# Patient Record
Sex: Male | Born: 1950 | Race: White | Hispanic: No | Marital: Married | Smoking: Former smoker
Health system: Southern US, Community
[De-identification: ages and names within clinical notes are randomized; demographics above are authoritative.]

## PROBLEM LIST (undated history)

## (undated) DIAGNOSIS — H548 Legal blindness, as defined in USA: Secondary | ICD-10-CM

## (undated) DIAGNOSIS — H409 Unspecified glaucoma: Secondary | ICD-10-CM

## (undated) DIAGNOSIS — E119 Type 2 diabetes mellitus without complications: Secondary | ICD-10-CM

## (undated) HISTORY — PX: TIBIA FRACTURE SURGERY: SHX806

## (undated) HISTORY — PX: EYE SURGERY: SHX253

---

## 2007-08-15 ENCOUNTER — Emergency Department (HOSPITAL_COMMUNITY): Admission: EM | Admit: 2007-08-15 | Discharge: 2007-08-15 | Payer: Self-pay | Admitting: Emergency Medicine

## 2011-07-17 LAB — URINALYSIS, ROUTINE W REFLEX MICROSCOPIC
Bilirubin Urine: NEGATIVE
Glucose, UA: 500 — AB
Ketones, ur: NEGATIVE
Leukocytes, UA: NEGATIVE
Nitrite: NEGATIVE
Protein, ur: 30 — AB
Specific Gravity, Urine: 1.029
Urobilinogen, UA: 1
pH: 6.5

## 2011-07-17 LAB — URINE MICROSCOPIC-ADD ON

## 2017-01-16 DIAGNOSIS — H44521 Atrophy of globe, right eye: Secondary | ICD-10-CM | POA: Diagnosis not present

## 2017-01-16 DIAGNOSIS — H401133 Primary open-angle glaucoma, bilateral, severe stage: Secondary | ICD-10-CM | POA: Diagnosis not present

## 2017-01-16 DIAGNOSIS — H548 Legal blindness, as defined in USA: Secondary | ICD-10-CM | POA: Diagnosis not present

## 2017-02-27 ENCOUNTER — Emergency Department (HOSPITAL_COMMUNITY): Payer: BC Managed Care – PPO

## 2017-02-27 ENCOUNTER — Encounter (HOSPITAL_COMMUNITY): Payer: Self-pay

## 2017-02-27 ENCOUNTER — Observation Stay (HOSPITAL_COMMUNITY): Payer: BC Managed Care – PPO

## 2017-02-27 ENCOUNTER — Observation Stay (HOSPITAL_COMMUNITY)
Admission: EM | Admit: 2017-02-27 | Discharge: 2017-03-06 | DRG: 240 | Disposition: A | Discharge status: Skilled Nursing Facility | Payer: BC Managed Care – PPO | Attending: Internal Medicine | Admitting: Internal Medicine

## 2017-02-27 DIAGNOSIS — L97519 Non-pressure chronic ulcer of other part of right foot with unspecified severity: Secondary | ICD-10-CM | POA: Diagnosis not present

## 2017-02-27 DIAGNOSIS — N179 Acute kidney failure, unspecified: Secondary | ICD-10-CM | POA: Diagnosis not present

## 2017-02-27 DIAGNOSIS — H548 Legal blindness, as defined in USA: Secondary | ICD-10-CM | POA: Insufficient documentation

## 2017-02-27 DIAGNOSIS — I96 Gangrene, not elsewhere classified: Secondary | ICD-10-CM | POA: Diagnosis not present

## 2017-02-27 DIAGNOSIS — E1042 Type 1 diabetes mellitus with diabetic polyneuropathy: Secondary | ICD-10-CM

## 2017-02-27 DIAGNOSIS — E119 Type 2 diabetes mellitus without complications: Secondary | ICD-10-CM

## 2017-02-27 DIAGNOSIS — Z87891 Personal history of nicotine dependence: Secondary | ICD-10-CM | POA: Diagnosis not present

## 2017-02-27 DIAGNOSIS — E1052 Type 1 diabetes mellitus with diabetic peripheral angiopathy with gangrene: Secondary | ICD-10-CM | POA: Diagnosis not present

## 2017-02-27 DIAGNOSIS — E10621 Type 1 diabetes mellitus with foot ulcer: Secondary | ICD-10-CM | POA: Diagnosis not present

## 2017-02-27 DIAGNOSIS — L0390 Cellulitis, unspecified: Secondary | ICD-10-CM

## 2017-02-27 DIAGNOSIS — E871 Hypo-osmolality and hyponatremia: Secondary | ICD-10-CM | POA: Diagnosis not present

## 2017-02-27 DIAGNOSIS — J101 Influenza due to other identified influenza virus with other respiratory manifestations: Secondary | ICD-10-CM

## 2017-02-27 DIAGNOSIS — J09X2 Influenza due to identified novel influenza A virus with other respiratory manifestations: Secondary | ICD-10-CM | POA: Diagnosis not present

## 2017-02-27 DIAGNOSIS — I10 Essential (primary) hypertension: Secondary | ICD-10-CM | POA: Insufficient documentation

## 2017-02-27 DIAGNOSIS — E1069 Type 1 diabetes mellitus with other specified complication: Secondary | ICD-10-CM | POA: Diagnosis not present

## 2017-02-27 DIAGNOSIS — R739 Hyperglycemia, unspecified: Secondary | ICD-10-CM | POA: Diagnosis present

## 2017-02-27 DIAGNOSIS — S91301A Unspecified open wound, right foot, initial encounter: Secondary | ICD-10-CM

## 2017-02-27 DIAGNOSIS — E1040 Type 1 diabetes mellitus with diabetic neuropathy, unspecified: Secondary | ICD-10-CM | POA: Diagnosis not present

## 2017-02-27 DIAGNOSIS — H409 Unspecified glaucoma: Secondary | ICD-10-CM | POA: Diagnosis not present

## 2017-02-27 DIAGNOSIS — D72829 Elevated white blood cell count, unspecified: Secondary | ICD-10-CM | POA: Diagnosis present

## 2017-02-27 DIAGNOSIS — E1152 Type 2 diabetes mellitus with diabetic peripheral angiopathy with gangrene: Secondary | ICD-10-CM | POA: Diagnosis not present

## 2017-02-27 DIAGNOSIS — L97518 Non-pressure chronic ulcer of other part of right foot with other specified severity: Secondary | ICD-10-CM | POA: Diagnosis not present

## 2017-02-27 DIAGNOSIS — N19 Unspecified kidney failure: Secondary | ICD-10-CM

## 2017-02-27 DIAGNOSIS — M86171 Other acute osteomyelitis, right ankle and foot: Secondary | ICD-10-CM | POA: Diagnosis not present

## 2017-02-27 DIAGNOSIS — L03115 Cellulitis of right lower limb: Secondary | ICD-10-CM | POA: Diagnosis not present

## 2017-02-27 DIAGNOSIS — E876 Hypokalemia: Secondary | ICD-10-CM | POA: Insufficient documentation

## 2017-02-27 DIAGNOSIS — E1065 Type 1 diabetes mellitus with hyperglycemia: Secondary | ICD-10-CM | POA: Insufficient documentation

## 2017-02-27 DIAGNOSIS — R05 Cough: Secondary | ICD-10-CM | POA: Diagnosis not present

## 2017-02-27 DIAGNOSIS — M8610 Other acute osteomyelitis, unspecified site: Secondary | ICD-10-CM | POA: Diagnosis present

## 2017-02-27 HISTORY — DX: Unspecified glaucoma: H40.9

## 2017-02-27 HISTORY — DX: Legal blindness, as defined in USA: H54.8

## 2017-02-27 LAB — GLUCOSE, CAPILLARY: Glucose-Capillary: 286 mg/dL — ABNORMAL HIGH (ref 65–99)

## 2017-02-27 LAB — PROCALCITONIN: Procalcitonin: 4.43 ng/mL

## 2017-02-27 LAB — CBC WITH DIFFERENTIAL/PLATELET
Basophils Absolute: 0 10*3/uL (ref 0.0–0.1)
Basophils Relative: 0 %
Eosinophils Absolute: 0 10*3/uL (ref 0.0–0.7)
Eosinophils Relative: 0 %
HCT: 36.8 % — ABNORMAL LOW (ref 39.0–52.0)
Hemoglobin: 13.2 g/dL (ref 13.0–17.0)
Lymphocytes Relative: 2 %
Lymphs Abs: 0.6 10*3/uL — ABNORMAL LOW (ref 0.7–4.0)
MCH: 31.1 pg (ref 26.0–34.0)
MCHC: 35.9 g/dL (ref 30.0–36.0)
MCV: 86.6 fL (ref 78.0–100.0)
Monocytes Absolute: 2.6 10*3/uL — ABNORMAL HIGH (ref 0.1–1.0)
Monocytes Relative: 9 %
Neutro Abs: 26.2 10*3/uL — ABNORMAL HIGH (ref 1.7–7.7)
Neutrophils Relative %: 89 %
Platelets: 207 10*3/uL (ref 150–400)
RBC: 4.25 MIL/uL (ref 4.22–5.81)
RDW: 12.7 % (ref 11.5–15.5)
WBC: 29.4 10*3/uL — ABNORMAL HIGH (ref 4.0–10.5)

## 2017-02-27 LAB — COMPREHENSIVE METABOLIC PANEL
ALT: 13 U/L — ABNORMAL LOW (ref 17–63)
AST: 20 U/L (ref 15–41)
Albumin: 3 g/dL — ABNORMAL LOW (ref 3.5–5.0)
Alkaline Phosphatase: 155 U/L — ABNORMAL HIGH (ref 38–126)
Anion gap: 14 (ref 5–15)
BUN: 41 mg/dL — ABNORMAL HIGH (ref 6–20)
CO2: 23 mmol/L (ref 22–32)
Calcium: 8.5 mg/dL — ABNORMAL LOW (ref 8.9–10.3)
Chloride: 94 mmol/L — ABNORMAL LOW (ref 101–111)
Creatinine, Ser: 2.1 mg/dL — ABNORMAL HIGH (ref 0.61–1.24)
GFR calc Af Amer: 36 mL/min — ABNORMAL LOW (ref >60)
GFR calc non Af Amer: 31 mL/min — ABNORMAL LOW (ref >60)
Glucose, Bld: 327 mg/dL — ABNORMAL HIGH (ref 65–99)
Potassium: 3.4 mmol/L — ABNORMAL LOW (ref 3.5–5.1)
Sodium: 131 mmol/L — ABNORMAL LOW (ref 135–145)
Total Bilirubin: 1.2 mg/dL (ref 0.3–1.2)
Total Protein: 7.9 g/dL (ref 6.5–8.1)

## 2017-02-27 LAB — BLOOD GAS, VENOUS
Acid-base deficit: 1.2 mmol/L (ref 0.0–2.0)
Bicarbonate: 23.7 mmol/L (ref 20.0–28.0)
O2 Saturation: 35.8 %
Patient temperature: 98.6
pCO2, Ven: 42.3 mmHg — ABNORMAL LOW (ref 44.0–60.0)
pH, Ven: 7.367 (ref 7.250–7.430)

## 2017-02-27 LAB — CBG MONITORING, ED: Glucose-Capillary: 313 mg/dL — ABNORMAL HIGH (ref 65–99)

## 2017-02-27 MED ORDER — ACETAMINOPHEN 325 MG PO TABS: 650.0000 mg | ORAL_TABLET | Freq: Four times a day (QID) | ORAL | Status: DC | PRN

## 2017-02-27 MED ORDER — INSULIN ASPART 100 UNIT/ML ~~LOC~~ SOLN
0.0000 [IU] | Freq: Three times a day (TID) | SUBCUTANEOUS | Status: DC
  Administered 2017-02-28: 8 [IU] via SUBCUTANEOUS
  Administered 2017-02-28 (×2): 5 [IU] via SUBCUTANEOUS
  Administered 2017-03-01: 8 [IU] via SUBCUTANEOUS
  Administered 2017-03-01 – 2017-03-02 (×4): 3 [IU] via SUBCUTANEOUS
  Administered 2017-03-02 – 2017-03-04 (×3): 2 [IU] via SUBCUTANEOUS
  Administered 2017-03-05: 3 [IU] via SUBCUTANEOUS
  Administered 2017-03-06: 2 [IU] via SUBCUTANEOUS
  Administered 2017-03-06: 3 [IU] via SUBCUTANEOUS

## 2017-02-27 MED ORDER — ONDANSETRON HCL 4 MG PO TABS: 4.0000 mg | ORAL_TABLET | Freq: Four times a day (QID) | ORAL | Status: DC | PRN

## 2017-02-27 MED ORDER — ACETAMINOPHEN 650 MG RE SUPP: 650.0000 mg | Freq: Four times a day (QID) | RECTAL | Status: DC | PRN

## 2017-02-27 MED ORDER — SODIUM CHLORIDE 0.9 % IV SOLN
INTRAVENOUS | Status: DC
  Administered 2017-02-27 – 2017-02-28 (×2): via INTRAVENOUS

## 2017-02-27 MED ORDER — SODIUM CHLORIDE 0.9% FLUSH
3.0000 mL | Freq: Two times a day (BID) | INTRAVENOUS | Status: DC
  Administered 2017-02-27 – 2017-03-05 (×4): 3 mL via INTRAVENOUS

## 2017-02-27 MED ORDER — LATANOPROST 0.005 % OP SOLN
1.0000 [drp] | Freq: Every day | OPHTHALMIC | Status: DC
  Administered 2017-02-28 – 2017-03-05 (×5): 1 [drp] via OPHTHALMIC
  Filled 2017-02-27 (×3): qty 2.5

## 2017-02-27 MED ORDER — OSELTAMIVIR PHOSPHATE 30 MG PO CAPS
30.0000 mg | ORAL_CAPSULE | Freq: Two times a day (BID) | ORAL | Status: AC
  Administered 2017-02-28 – 2017-03-04 (×8): 30 mg via ORAL
  Filled 2017-02-27 (×11): qty 1

## 2017-02-27 MED ORDER — OSELTAMIVIR PHOSPHATE 75 MG PO CAPS
75.0000 mg | ORAL_CAPSULE | Freq: Once | ORAL | Status: AC
  Administered 2017-02-27: 75 mg via ORAL
  Filled 2017-02-27: qty 1

## 2017-02-27 MED ORDER — BRIMONIDINE TARTRATE 0.15 % OP SOLN
1.0000 [drp] | Freq: Three times a day (TID) | OPHTHALMIC | Status: DC
  Administered 2017-02-28 – 2017-03-04 (×10): 1 [drp] via OPHTHALMIC
  Filled 2017-02-27 (×3): qty 5

## 2017-02-27 MED ORDER — SODIUM CHLORIDE 0.9 % IV BOLUS (SEPSIS)
2000.0000 mL | Freq: Once | INTRAVENOUS | Status: AC
  Administered 2017-02-27: 2000 mL via INTRAVENOUS

## 2017-02-27 MED ORDER — INSULIN ASPART 100 UNIT/ML ~~LOC~~ SOLN
0.0000 [IU] | Freq: Every day | SUBCUTANEOUS | Status: DC
  Administered 2017-02-27 – 2017-02-28 (×2): 3 [IU] via SUBCUTANEOUS

## 2017-02-27 MED ORDER — PILOCARPINE HCL 2 % OP SOLN
1.0000 [drp] | Freq: Four times a day (QID) | OPHTHALMIC | Status: DC
  Administered 2017-02-28 – 2017-03-06 (×20): 1 [drp] via OPHTHALMIC
  Filled 2017-02-27 (×4): qty 15

## 2017-02-27 MED ORDER — BRINZOLAMIDE 1 % OP SUSP
1.0000 [drp] | Freq: Three times a day (TID) | OPHTHALMIC | Status: DC
  Administered 2017-02-28 – 2017-03-06 (×15): 1 [drp] via OPHTHALMIC
  Filled 2017-02-27 (×3): qty 10

## 2017-02-27 MED ORDER — HEPARIN SODIUM (PORCINE) 5000 UNIT/ML IJ SOLN
5000.0000 [IU] | Freq: Three times a day (TID) | INTRAMUSCULAR | Status: DC
  Administered 2017-02-27 – 2017-03-06 (×17): 5000 [IU] via SUBCUTANEOUS
  Filled 2017-02-27 (×17): qty 1

## 2017-02-27 MED ORDER — TIMOLOL MALEATE 0.5 % OP SOLN
1.0000 [drp] | Freq: Two times a day (BID) | OPHTHALMIC | Status: DC
Start: 2017-02-27 — End: 2017-03-06
  Administered 2017-02-28 – 2017-03-06 (×10): 1 [drp] via OPHTHALMIC
  Filled 2017-02-27 (×3): qty 5

## 2017-02-27 MED ORDER — ONDANSETRON HCL 4 MG/2ML IJ SOLN
4.0000 mg | Freq: Four times a day (QID) | INTRAMUSCULAR | Status: DC | PRN
  Administered 2017-02-27 – 2017-02-28 (×2): 4 mg via INTRAVENOUS
  Filled 2017-02-27 (×3): qty 2

## 2017-02-27 NOTE — ED Notes (Signed)
Provider at bedside

## 2017-02-27 NOTE — ED Triage Notes (Signed)
Pt comes via EMS from Urgent Care with complaints of flu like symptoms since Friday.  Pt ambulatory on arrival.  Denies N&V.  Endorses chills and low grade fever. CBG at UC 297. Vitals 140/98, HR 90 in route.

## 2017-02-27 NOTE — ED Notes (Signed)
Called 4th floor, Samantha, RN will be receiving care for patient. Andrena MewsSpoke Tara, RN to have the 20 minute timer start.

## 2017-02-27 NOTE — ED Notes (Signed)
PA from urgent called, states pt has WBC of 29 and had urinalyses showing blood, glucose and protein.

## 2017-02-27 NOTE — ED Triage Notes (Signed)
Pt went to urgent care for flu like symptoms.  Pt dx with flu A.  Pt had labs drawn and told his glucose was too high.  Sent here by EMS.

## 2017-02-27 NOTE — ED Provider Notes (Addendum)
[facility]-EMERGENCY DEPT Provider Note   CSN: [id number] Arrival date & time: 02/27/17  1444     History   Chief Complaint Chief Complaint  Patient presents with  . Flu Like Symptoms  . Hyperglycemia    HPI Samuel Roth is a 66 y.o. male.  HPI  Pt comes in with cc of flu like symptoms and elevated blood sugar. Pt has no known medical hx. Pt reports that he has been feeling sick since Friday. Pt has been having cough - producing white phlegm. No associated wheezing, chest pain, dib. + associated back aches, malaise and anorexia. + low grade fevers.  Pt went to urgent care, where he was diagnosed with flu and maybe new osnet diabetes, and so he was sent to the ER. Pt had a + flu test, WC of 29K and A1C > 9% per records from the outside hospital. Pt also had a neg CXR.  Past Medical History:  Diagnosis Date  . Glaucoma   . Legally blind     There are no active problems to display for this patient.   Past Surgical History:  Procedure Laterality Date  . EYE SURGERY    . TIBIA FRACTURE SURGERY         Home Medications    Prior to Admission medications   Medication Sig Start Date End Date Taking? Authorizing Provider  brimonidine (ALPHAGAN P) 0.1 % SOLN Place 1 drop into both eyes 3 (three) times daily. 12/15/16  Yes [provider]  brinzolamide (AZOPT) 1 % ophthalmic suspension Place 1 drop into the left eye 3 (three) times daily. 01/01/17  Yes [provider]  methazolamide (NEPTAZANE) 50 MG tablet Take 50 mg by mouth 3 (three) times daily. 02/02/17  Yes [provider]  pilocarpine (PILOCAR) 2 % ophthalmic solution Place 1 drop into the left eye 4 (four) times daily. 01/01/17  Yes [provider]  timolol (TIMOPTIC) 0.5 % ophthalmic solution Place 1 drop into the left eye 2 (two) times daily. 01/01/17  Yes [provider]  Travoprost, BAK Free, (TRAVATAN Z) 0.004 % SOLN ophthalmic solution Place 1 drop into the left eye daily.  12/15/16  Yes [provider]    Family History History reviewed. No pertinent family history.  Social History Social History  Substance Use Topics  . Smoking status: Former Games developer  . Smokeless tobacco: Former Neurosurgeon  . Alcohol use Yes     Comment: social     Allergies   Ciprofloxacin   Review of Systems Review of Systems  Constitutional: Positive for activity change, appetite change, diaphoresis and fever.  HENT: Negative for congestion.   Respiratory: Positive for cough. Negative for shortness of breath.   Cardiovascular: Negative for chest pain.  Gastrointestinal: Negative for abdominal pain, nausea and vomiting.  Genitourinary: Negative for dysuria.  Musculoskeletal: Positive for back pain and myalgias.     Physical Exam Updated Vital Signs BP 137/79 (BP Location: Right Arm)   Pulse 87   Temp 98.9 F (37.2 C) (Oral)   Resp 20   SpO2 99%   Physical Exam  Constitutional: He is oriented to person, place, and time. He appears well-developed.  HENT:  Head: Atraumatic.  Eyes: Pupils are equal, round, and reactive to light.  Neck: Neck supple. No JVD present.  No meningismus  Cardiovascular: Normal rate.   Pulmonary/Chest: Effort normal. He has no wheezes.  Abdominal: Soft. There is no tenderness.  Musculoskeletal: He exhibits no edema.  Lymphadenopathy:    He  has no cervical adenopathy.  Neurological: He is alert and oriented to person, place, and time.  Skin: Skin is warm.  Nursing note and vitals reviewed.    ED Treatments / Results  Labs (all labs ordered are listed, but only abnormal results are displayed) Labs Reviewed  COMPREHENSIVE METABOLIC PANEL - Abnormal; Notable for the following:       Result Value   Sodium 131 (*)    Potassium 3.4 (*)    Chloride 94 (*)    Glucose, Bld 327 (*)    BUN 41 (*)    Creatinine, Ser 2.10 (*)    Calcium 8.5 (*)    Albumin 3.0 (*)    ALT 13 (*)    Alkaline Phosphatase 155 (*)    GFR calc non Af Amer  31 (*)    GFR calc Af Amer 36 (*)    All other components within normal limits  CBC WITH DIFFERENTIAL/PLATELET - Abnormal; Notable for the following:    WBC 29.4 (*)    HCT 36.8 (*)    All other components within normal limits  BLOOD GAS, VENOUS - Abnormal; Notable for the following:    pCO2, Ven 42.3 (*)    All other components within normal limits  CBG MONITORING, ED - Abnormal; Notable for the following:    Glucose-Capillary 313 (*)    All other components within normal limits  CULTURE, BLOOD (ROUTINE X 2)  CULTURE, BLOOD (ROUTINE X 2)  I-STAT CG4 LACTIC ACID, ED    EKG  EKG Interpretation None       Radiology No results found.  Procedures Procedures (including critical care time)  Medications Ordered in ED Medications  sodium chloride 0.9 % bolus 2,000 mL (2,000 mLs Intravenous New Bag/Given 02/27/17 1649)  oseltamivir (TAMIFLU) capsule 75 mg (not administered)     Initial Impression / Assessment and Plan / ED Course  I have reviewed the triage vital signs and the nursing notes.  Pertinent labs & imaging results that were available during my care of the patient were reviewed by me and considered in my medical decision making (see chart for details).  Clinical Course as of Feb 28 1736  Wed Feb 27, 2017  1737 We will admit for AKI, with new onset DM and influenza. Patient has no PCP. Creatinine: (!) 2.10 [AN]    Clinical Course User Index [AN] Derwood KaplanNanavati, Kalmen Lollar, MD    Pt comes [facility] with generalized weakness. [facility] likely new onset DM and flu - so he was sent to the ER. Pt has respiratory symptoms, cough, myalgias, body aches. No flu exposure.   Final Clinical Impressions(s) / ED Diagnoses   Final diagnoses:  AKI (acute kidney injury) (HCC)  Diabetes mellitus, new onset (HCC)  Influenza A    New Prescriptions New Prescriptions   No medications on file     Derwood KaplanNanavati, Adriano Bischof, MD 02/27/17 1736    Derwood KaplanNanavati, Othel Dicostanzo, MD 02/27/17  1737

## 2017-02-27 NOTE — H&P (Signed)
History and Physical    Samuel SimsJohn Roth ZOX:[id number] DOB: 24-Jun-1951 DOA: 02/27/2017  PCP: Patient, No Pcp Per   Patient coming from: Home   I have personally briefly [facility]  Chief Complaint: Flu like symptoms   HPI: Samuel SimsJohn Roth is a 66 y.o. male with medical history significant of glaucoma who is legally blind presented with complaints of flulike symptoms for the last 5-6 days. Patient complains of cough with whitish expectoration associated with fever, chills, malaise, decreased appetite and decreased urine output. He also complains of nausea and intermittent vomiting. He denies any chest pain, palpitations, loss of consciousness, seizures, recent sick contact or travel.  Patient went to urgent care, where he was diagnosed with flu and hyperglycemia with question of new-onset diabetes and sent to emergency room for evaluation. Patient apparently had a negative chest x-ray in the urgent care facility.  ED Course: Patient was given IV fluids and oral Tamiflu. Hospitalist service was called to admit the patient  Review of Systems: As per HPI otherwise 10 point review of systems negative.    Past Medical History:  Diagnosis Date  . Glaucoma   . Legally blind     Past Surgical History:  Procedure Laterality Date  . EYE SURGERY    . TIBIA FRACTURE SURGERY     Social history  reports that he has quit smoking. He has quit using smokeless tobacco. He reports that he drinks alcohol. He reports that he does not use drugs.  Lives at home with family  Allergies  Allergen Reactions  . Ciprofloxacin Hives    History reviewed. No pertinent family history.  Prior to Admission medications   Medication Sig Start Date End Date Taking? Authorizing Provider  brimonidine (ALPHAGAN P) 0.1 % SOLN Place 1 drop into both eyes 3 (three) times daily. 12/15/16  Yes [provider]  brinzolamide (AZOPT) 1 % ophthalmic suspension Place 1 drop  into the left eye 3 (three) times daily. 01/01/17  Yes [provider]  methazolamide (NEPTAZANE) 50 MG tablet Take 50 mg by mouth 3 (three) times daily. 02/02/17  Yes [provider]  pilocarpine (PILOCAR) 2 % ophthalmic solution Place 1 drop into the left eye 4 (four) times daily. 01/01/17  Yes [provider]  timolol (TIMOPTIC) 0.5 % ophthalmic solution Place 1 drop into the left eye 2 (two) times daily. 01/01/17  Yes [provider]  Travoprost, BAK Free, (TRAVATAN Z) 0.004 % SOLN ophthalmic solution Place 1 drop into the left eye daily. 12/15/16  Yes [provider]    Physical Exam: Vitals:   02/27/17 1450 02/27/17 1649 02/27/17 1806  BP: (!) 143/78 137/79 130/71  Pulse: (!) 120 87 87  Resp: 18 20 20   Temp: 99.3 F (37.4 C) 98.9 F (37.2 C)   TempSrc: Oral Oral   SpO2: 100% 99% 100%    Constitutional: NAD, calm, comfortable Vitals:   02/27/17 1450 02/27/17 1649 02/27/17 1806  BP: (!) 143/78 137/79 130/71  Pulse: (!) 120 87 87  Resp: 18 20 20   Temp: 99.3 F (37.4 C) 98.9 F (37.2 C)   TempSrc: Oral Oral   SpO2: 100% 99% 100%   Eyes: PERRL, lids and conjunctivae normal ENMT: Mucous membranes are Dry. Posterior pharynx clear of any exudate or lesions.Normal dentition.  Neck: normal, supple, no masses, no thyromegaly Respiratory: clear to auscultation bilaterally, no wheezing, no crackles. Normal respiratory effort. No accessory muscle use.  Cardiovascular: S1 and S2  positive, intermittent tachycardia. No murmurs  Abdomen: no tenderness, no masses palpated. No hepatosplenomegaly. Bowel sounds positive.  Musculoskeletal: no clubbing / cyanosis. No joint deformity upper and lower extremities. Good ROM, no contractures. Normal muscle tone.  Skin: no rashes, lesions, ulcers. No induration Neurologic: CN 2-12 grossly intact. Sensation intact, DTR normal. Strength 5/5 in all 4.  Psychiatric: Normal judgment and insight. Alert and oriented  x 3. Normal mood.    Labs on Admission: I have personally reviewed following labs and imaging studies  CBC:  Recent Labs Lab 02/27/17 1644  WBC 29.4*  NEUTROABS 26.2*  HGB 13.2  HCT 36.8*  MCV 86.6  PLT 207   Basic Metabolic Panel:  Recent Labs Lab 02/27/17 1644  NA 131*  K 3.4*  CL 94*  CO2 23  GLUCOSE 327*  BUN 41*  CREATININE 2.10*  CALCIUM 8.5*   GFR: CrCl cannot be calculated (Unknown ideal weight.). Liver Function Tests:  Recent Labs Lab 02/27/17 1644  AST 20  ALT 13*  ALKPHOS 155*  BILITOT 1.2  PROT 7.9  ALBUMIN 3.0*   No results for input(s): LIPASE, AMYLASE in the last 168 hours. No results for input(s): AMMONIA in the last 168 hours. Coagulation Profile: No results for input(s): INR, PROTIME in the last 168 hours. Cardiac Enzymes: No results for input(s): CKTOTAL, CKMB, CKMBINDEX, TROPONINI in the last 168 hours. BNP (last 3 results) No results for input(s): PROBNP in the last 8760 hours. HbA1C: No results for input(s): HGBA1C in the last 72 hours. CBG:  Recent Labs Lab 02/27/17 1501  GLUCAP 313*   Lipid Profile: No results for input(s): CHOL, HDL, LDLCALC, TRIG, CHOLHDL, LDLDIRECT in the last 72 hours. Thyroid Function Tests: No results for input(s): TSH, T4TOTAL, FREET4, T3FREE, THYROIDAB in the last 72 hours. Anemia Panel: No results for input(s): VITAMINB12, FOLATE, FERRITIN, TIBC, IRON, RETICCTPCT in the last 72 hours. Urine analysis:    Component Value Date/Time   COLORURINE YELLOW 08/15/2007 0332   APPEARANCEUR CLOUDY (A) 08/15/2007 0332   LABSPEC 1.029 08/15/2007 0332   PHURINE 6.5 08/15/2007 0332   GLUCOSEU 500 (A) 08/15/2007 0332   HGBUR LARGE (A) 08/15/2007 0332   BILIRUBINUR NEGATIVE 08/15/2007 0332   KETONESUR NEGATIVE 08/15/2007 0332   PROTEINUR 30 (A) 08/15/2007 0332   UROBILINOGEN 1.0 08/15/2007 0332   NITRITE NEGATIVE 08/15/2007 0332   LEUKOCYTESUR NEGATIVE 08/15/2007 0332    Radiological Exams on  Admission: No results found.   Assessment/Plan Principal Problem:   Acute kidney injury (HCC) Active Problems:   Hyperglycemia   Leukocytosis  1. Dyspnea: Probably due to influenza bronchitis - Oxygen supplementation if needed  2. Probable influenza bronchitis:  - Supportive treatment. Tamiflu for 5 days. Repeat chest x-ray - If respiratory status does not improve, we will need to get CT scan of the chest  3. Acute kidney injury: - Probably due to dehydration. - IV fluids normal saline at 125 ML/hr. renal ultrasound. Repeat creatinine in a.m. Avoid nephrotoxic medications including oral glaucoma medication  4. Hyperglycemia: Rule out newly diagnosed diabetes - Check HbA1c in a.m.  - SSI for now. - May need outpatient follow-up with primary care provider  5. History of glaucoma and legally blind: Continue with home eyedrops. Hold oral medication  6. Hyponatremia and hypokalemia: Probably due to dehydration. IV fluids. Repeat a.m. Labs     DVT prophylaxis: Heparin Code Status: Full  Family Communication: Discussed with wife present at bedside Disposition Plan: Home in 1-2 days Consults called: None  Admission status: Observation   Glade Lloyd MD [facility] Pager [phone]  If 7PM-7AM, please contact night-coverage [email or web address] Password [facility]  02/27/2017, 6:09 PM

## 2017-02-27 NOTE — ED Notes (Signed)
Hospitalist at bedside 

## 2017-02-28 ENCOUNTER — Other Ambulatory Visit (INDEPENDENT_AMBULATORY_CARE_PROVIDER_SITE_OTHER): Payer: Self-pay | Admitting: Family

## 2017-02-28 ENCOUNTER — Observation Stay (HOSPITAL_COMMUNITY): Payer: BC Managed Care – PPO

## 2017-02-28 DIAGNOSIS — N19 Unspecified kidney failure: Secondary | ICD-10-CM | POA: Diagnosis not present

## 2017-02-28 DIAGNOSIS — E871 Hypo-osmolality and hyponatremia: Secondary | ICD-10-CM | POA: Diagnosis not present

## 2017-02-28 DIAGNOSIS — Z87891 Personal history of nicotine dependence: Secondary | ICD-10-CM | POA: Diagnosis not present

## 2017-02-28 DIAGNOSIS — M8610 Other acute osteomyelitis, unspecified site: Secondary | ICD-10-CM | POA: Diagnosis not present

## 2017-02-28 DIAGNOSIS — Z79899 Other long term (current) drug therapy: Secondary | ICD-10-CM | POA: Diagnosis not present

## 2017-02-28 DIAGNOSIS — S91301A Unspecified open wound, right foot, initial encounter: Secondary | ICD-10-CM | POA: Diagnosis not present

## 2017-02-28 DIAGNOSIS — M86161 Other acute osteomyelitis, right tibia and fibula: Secondary | ICD-10-CM | POA: Diagnosis not present

## 2017-02-28 DIAGNOSIS — H409 Unspecified glaucoma: Secondary | ICD-10-CM | POA: Diagnosis not present

## 2017-02-28 DIAGNOSIS — H548 Legal blindness, as defined in USA: Secondary | ICD-10-CM | POA: Diagnosis present

## 2017-02-28 DIAGNOSIS — L97518 Non-pressure chronic ulcer of other part of right foot with other specified severity: Secondary | ICD-10-CM | POA: Diagnosis not present

## 2017-02-28 DIAGNOSIS — D72829 Elevated white blood cell count, unspecified: Secondary | ICD-10-CM | POA: Diagnosis not present

## 2017-02-28 DIAGNOSIS — G8918 Other acute postprocedural pain: Secondary | ICD-10-CM | POA: Diagnosis not present

## 2017-02-28 DIAGNOSIS — M869 Osteomyelitis, unspecified: Secondary | ICD-10-CM | POA: Diagnosis not present

## 2017-02-28 DIAGNOSIS — I96 Gangrene, not elsewhere classified: Secondary | ICD-10-CM | POA: Diagnosis not present

## 2017-02-28 DIAGNOSIS — Z881 Allergy status to other antibiotic agents status: Secondary | ICD-10-CM | POA: Diagnosis not present

## 2017-02-28 DIAGNOSIS — N289 Disorder of kidney and ureter, unspecified: Secondary | ICD-10-CM | POA: Diagnosis not present

## 2017-02-28 DIAGNOSIS — E876 Hypokalemia: Secondary | ICD-10-CM | POA: Diagnosis present

## 2017-02-28 DIAGNOSIS — I10 Essential (primary) hypertension: Secondary | ICD-10-CM | POA: Diagnosis present

## 2017-02-28 DIAGNOSIS — L03115 Cellulitis of right lower limb: Secondary | ICD-10-CM | POA: Diagnosis not present

## 2017-02-28 DIAGNOSIS — E1140 Type 2 diabetes mellitus with diabetic neuropathy, unspecified: Secondary | ICD-10-CM | POA: Diagnosis present

## 2017-02-28 DIAGNOSIS — K219 Gastro-esophageal reflux disease without esophagitis: Secondary | ICD-10-CM | POA: Diagnosis present

## 2017-02-28 DIAGNOSIS — M86171 Other acute osteomyelitis, right ankle and foot: Secondary | ICD-10-CM | POA: Diagnosis not present

## 2017-02-28 DIAGNOSIS — J40 Bronchitis, not specified as acute or chronic: Secondary | ICD-10-CM | POA: Diagnosis present

## 2017-02-28 DIAGNOSIS — E1042 Type 1 diabetes mellitus with diabetic polyneuropathy: Secondary | ICD-10-CM | POA: Diagnosis not present

## 2017-02-28 DIAGNOSIS — Z4889 Encounter for other specified surgical aftercare: Secondary | ICD-10-CM | POA: Diagnosis not present

## 2017-02-28 DIAGNOSIS — E1152 Type 2 diabetes mellitus with diabetic peripheral angiopathy with gangrene: Secondary | ICD-10-CM | POA: Diagnosis not present

## 2017-02-28 DIAGNOSIS — N170 Acute kidney failure with tubular necrosis: Secondary | ICD-10-CM | POA: Diagnosis not present

## 2017-02-28 DIAGNOSIS — S88111D Complete traumatic amputation at level between knee and ankle, right lower leg, subsequent encounter: Secondary | ICD-10-CM | POA: Diagnosis not present

## 2017-02-28 DIAGNOSIS — Z89511 Acquired absence of right leg below knee: Secondary | ICD-10-CM | POA: Diagnosis not present

## 2017-02-28 DIAGNOSIS — J449 Chronic obstructive pulmonary disease, unspecified: Secondary | ICD-10-CM | POA: Diagnosis present

## 2017-02-28 DIAGNOSIS — E11621 Type 2 diabetes mellitus with foot ulcer: Secondary | ICD-10-CM | POA: Diagnosis present

## 2017-02-28 DIAGNOSIS — E1169 Type 2 diabetes mellitus with other specified complication: Secondary | ICD-10-CM | POA: Diagnosis not present

## 2017-02-28 DIAGNOSIS — E1142 Type 2 diabetes mellitus with diabetic polyneuropathy: Secondary | ICD-10-CM | POA: Diagnosis not present

## 2017-02-28 DIAGNOSIS — E119 Type 2 diabetes mellitus without complications: Secondary | ICD-10-CM | POA: Diagnosis not present

## 2017-02-28 DIAGNOSIS — J101 Influenza due to other identified influenza virus with other respiratory manifestations: Secondary | ICD-10-CM | POA: Diagnosis not present

## 2017-02-28 DIAGNOSIS — R278 Other lack of coordination: Secondary | ICD-10-CM | POA: Diagnosis not present

## 2017-02-28 DIAGNOSIS — M6281 Muscle weakness (generalized): Secondary | ICD-10-CM | POA: Diagnosis not present

## 2017-02-28 DIAGNOSIS — R488 Other symbolic dysfunctions: Secondary | ICD-10-CM | POA: Diagnosis not present

## 2017-02-28 DIAGNOSIS — E1165 Type 2 diabetes mellitus with hyperglycemia: Secondary | ICD-10-CM | POA: Diagnosis not present

## 2017-02-28 DIAGNOSIS — L0390 Cellulitis, unspecified: Secondary | ICD-10-CM | POA: Diagnosis not present

## 2017-02-28 DIAGNOSIS — N179 Acute kidney failure, unspecified: Secondary | ICD-10-CM | POA: Diagnosis not present

## 2017-02-28 DIAGNOSIS — E860 Dehydration: Secondary | ICD-10-CM | POA: Diagnosis present

## 2017-02-28 DIAGNOSIS — R739 Hyperglycemia, unspecified: Secondary | ICD-10-CM | POA: Diagnosis not present

## 2017-02-28 DIAGNOSIS — S88019A Complete traumatic amputation at knee level, unspecified lower leg, initial encounter: Secondary | ICD-10-CM | POA: Diagnosis not present

## 2017-02-28 LAB — GLUCOSE, CAPILLARY
Glucose-Capillary: 241 mg/dL — ABNORMAL HIGH (ref 65–99)
Glucose-Capillary: 225 mg/dL — ABNORMAL HIGH (ref 65–99)
Glucose-Capillary: 256 mg/dL — ABNORMAL HIGH (ref 65–99)
Glucose-Capillary: 257 mg/dL — ABNORMAL HIGH (ref 65–99)

## 2017-02-28 LAB — SURGICAL PCR SCREEN
MRSA, PCR: NEGATIVE
Staphylococcus aureus: NEGATIVE

## 2017-02-28 LAB — COMPREHENSIVE METABOLIC PANEL
ALT: 10 U/L — ABNORMAL LOW (ref 17–63)
AST: 15 U/L (ref 15–41)
Albumin: 2.1 g/dL — ABNORMAL LOW (ref 3.5–5.0)
Alkaline Phosphatase: 124 U/L (ref 38–126)
Anion gap: 13 (ref 5–15)
BUN: 26 mg/dL — ABNORMAL HIGH (ref 6–20)
CO2: 21 mmol/L — ABNORMAL LOW (ref 22–32)
Calcium: 7.6 mg/dL — ABNORMAL LOW (ref 8.9–10.3)
Chloride: 98 mmol/L — ABNORMAL LOW (ref 101–111)
Creatinine, Ser: 1.24 mg/dL (ref 0.61–1.24)
GFR calc Af Amer: >60 mL/min (ref >60)
GFR calc non Af Amer: 59 mL/min — ABNORMAL LOW (ref >60)
Glucose, Bld: 266 mg/dL — ABNORMAL HIGH (ref 65–99)
Potassium: 2.9 mmol/L — ABNORMAL LOW (ref 3.5–5.1)
Sodium: 132 mmol/L — ABNORMAL LOW (ref 135–145)
Total Bilirubin: 1.2 mg/dL (ref 0.3–1.2)
Total Protein: 6 g/dL — ABNORMAL LOW (ref 6.5–8.1)

## 2017-02-28 LAB — CBC WITH DIFFERENTIAL/PLATELET
Basophils Absolute: 0 10*3/uL (ref 0.0–0.1)
Basophils Relative: 0 %
Eosinophils Absolute: 0 10*3/uL (ref 0.0–0.7)
Eosinophils Relative: 0 %
HCT: 28.9 % — ABNORMAL LOW (ref 39.0–52.0)
Hemoglobin: 10.3 g/dL — ABNORMAL LOW (ref 13.0–17.0)
Lymphocytes Relative: 3 %
Lymphs Abs: 0.8 10*3/uL (ref 0.7–4.0)
MCH: 30.7 pg (ref 26.0–34.0)
MCHC: 35.6 g/dL (ref 30.0–36.0)
MCV: 86.3 fL (ref 78.0–100.0)
Monocytes Absolute: 2.4 10*3/uL — ABNORMAL HIGH (ref 0.1–1.0)
Monocytes Relative: 9 %
Neutro Abs: 23.9 10*3/uL — ABNORMAL HIGH (ref 1.7–7.7)
Neutrophils Relative %: 88 %
Platelets: 218 10*3/uL (ref 150–400)
RBC: 3.35 MIL/uL — ABNORMAL LOW (ref 4.22–5.81)
RDW: 12.9 % (ref 11.5–15.5)
WBC: 27.1 10*3/uL — ABNORMAL HIGH (ref 4.0–10.5)

## 2017-02-28 LAB — LACTIC ACID, PLASMA
Lactic Acid, Venous: 1.1 mmol/L (ref 0.5–1.9)
Lactic Acid, Venous: 2.4 mmol/L (ref 0.5–1.9)

## 2017-02-28 LAB — HIV ANTIBODY (ROUTINE TESTING W REFLEX): HIV Screen 4th Generation wRfx: NONREACTIVE

## 2017-02-28 MED ORDER — PIPERACILLIN-TAZOBACTAM 3.375 G IVPB
3.3750 g | Freq: Three times a day (TID) | INTRAVENOUS | Status: DC
  Administered 2017-02-28 – 2017-03-06 (×16): 3.375 g via INTRAVENOUS
  Filled 2017-02-28 (×20): qty 50

## 2017-02-28 MED ORDER — PROMETHAZINE HCL 25 MG/ML IJ SOLN
12.5000 mg | Freq: Four times a day (QID) | INTRAMUSCULAR | Status: DC | PRN
  Administered 2017-02-28: 12.5 mg via INTRAVENOUS

## 2017-02-28 MED ORDER — CHLORHEXIDINE GLUCONATE 4 % EX LIQD
60.0000 mL | Freq: Once | CUTANEOUS | Status: AC
  Administered 2017-03-01: 4 via TOPICAL

## 2017-02-28 MED ORDER — SODIUM CHLORIDE 0.9% FLUSH
10.0000 mL | INTRAVENOUS | Status: DC | PRN
  Administered 2017-03-01 (×2): 10 mL
  Filled 2017-02-28 (×2): qty 40

## 2017-02-28 MED ORDER — VANCOMYCIN HCL IN DEXTROSE 1-5 GM/200ML-% IV SOLN: 1000.0000 mg | INTRAVENOUS | Status: DC

## 2017-02-28 MED ORDER — LIVING WELL WITH DIABETES BOOK
Freq: Once | Status: AC
  Administered 2017-03-01: 10:00:00
  Filled 2017-02-28: qty 1

## 2017-02-28 MED ORDER — POTASSIUM CHLORIDE IN NACL 40-0.9 MEQ/L-% IV SOLN
INTRAVENOUS | Status: DC
  Administered 2017-02-28: 100 mL/h via INTRAVENOUS
  Filled 2017-02-28 (×2): qty 1000

## 2017-02-28 MED ORDER — ONDANSETRON HCL 4 MG/2ML IJ SOLN
4.0000 mg | Freq: Four times a day (QID) | INTRAMUSCULAR | Status: DC | PRN
  Administered 2017-02-28: 4 mg via INTRAMUSCULAR

## 2017-02-28 MED ORDER — VANCOMYCIN HCL IN DEXTROSE 1-5 GM/200ML-% IV SOLN
1000.0000 mg | Freq: Once | INTRAVENOUS | Status: AC
  Administered 2017-02-28: 1000 mg via INTRAVENOUS
  Filled 2017-02-28: qty 200

## 2017-02-28 MED ORDER — INSULIN GLARGINE 100 UNIT/ML ~~LOC~~ SOLN
15.0000 [IU] | Freq: Every day | SUBCUTANEOUS | Status: DC
  Administered 2017-02-28 – 2017-03-05 (×6): 15 [IU] via SUBCUTANEOUS
  Filled 2017-02-28 (×7): qty 0.15

## 2017-02-28 MED ORDER — CEFAZOLIN SODIUM-DEXTROSE 2-4 GM/100ML-% IV SOLN
2.0000 g | INTRAVENOUS | Status: AC
  Administered 2017-03-01: 2 g via INTRAVENOUS
  Filled 2017-02-28: qty 100

## 2017-02-28 MED ORDER — VANCOMYCIN HCL IN DEXTROSE 1-5 GM/200ML-% IV SOLN
1000.0000 mg | Freq: Two times a day (BID) | INTRAVENOUS | Status: DC
  Administered 2017-02-28 – 2017-03-06 (×10): 1000 mg via INTRAVENOUS
  Filled 2017-02-28 (×14): qty 200

## 2017-02-28 MED ORDER — PROMETHAZINE HCL 25 MG/ML IJ SOLN
12.5000 mg | Freq: Once | INTRAMUSCULAR | Status: DC
  Filled 2017-02-28: qty 1

## 2017-02-28 NOTE — Progress Notes (Signed)
CRITICAL VALUE ALERT  Critical value received: xray report shows soft tissue gas/ swelling.  Suspicious for gas forming/ necrotizing infection  Date of notification: 02/28/17  Time of notification: 0850  Critical value read back: yes  Nurse who received alert:  Corrie DandyMary Karren Newland  MD notified (1st page): Dr. Hanley BenAlekh on floor, verbal given.  Time of first page:    MD notified (2nd page):   Time of second page:  Responding MD:   Time MD responded:

## 2017-02-28 NOTE — Progress Notes (Signed)
Initial Nutrition Assessment  DOCUMENTATION CODES:   Not applicable  INTERVENTION:  - Will provide appropriate interventions at follow-up. - Will monitor for need for diet education prior to d/c.  NUTRITION DIAGNOSIS:   Inadequate protein intake related to  (current diet order) as evidenced by  (CLD does not meet estimated protein need).  GOAL:   Patient will meet greater than or equal to 90% of their needs  MONITOR:   Diet advancement, PO intake, Weight trends, Labs, Skin, I & O's  REASON FOR ASSESSMENT:   Malnutrition Screening Tool  ASSESSMENT:   66 y.o. male with medical history significant of glaucoma who is legally blind presented with complaints of flulike symptoms for the last 5-6 days. Patient complains of cough with whitish expectoration associated with fever, chills, malaise, decreased appetite and decreased urine output. He also complains of nausea and intermittent vomiting.  Pt seen for MST. BMI indicates normal weight/borderline overweight. Pt on CLD with no intakes and plan for NPO at midnight tonight for probable amputation tomorrow; [facility] He confirms flu-like symptoms for 1 week and that his last episode of emesis was a few minutes prior to RD visit. He feels N/V have worsened since admission and attributes this to medications. He was unable to consume anything from CLD today and is uninterested in consuming anything at this time. He states that in the days PTA he was forcing himself to consume items such as applesauce and yogurt but was only able to take a few bites before vomiting; unable to tolerate any other foods during that time. Unable to obtain further information at this time d/t pt feeling so unwell. RN reports pt is likely septic.   Pt reports UBW of 169 lbs with his clothes on and feels that without clothes his weight may be anywhere from 159-165 lbs; will use weight in chart to estimate needs and monitor weight trends  closely. Pt would prefer physical assessment not be done at this time. Will attempt at follow-up.  Medications reviewed; sliding scale Novolog.  Labs reviewed; CBG: 241 mg/dL, Na: 409132 mmol/L, Cl: 98 mmol/L, K: 2.9 mmol/L, BUN: 26 mg/dL, Ca: 7.6 mg/dL.  IVF: NS-40 mEq KCl @ 100 mL/hr.    Diet Order:  Diet NPO time specified Diet clear liquid Room service appropriate? Yes; Fluid consistency: Thin  Skin:  Wound (see comment) (R foot DM ulcer)  Last BM:  PTA/unknown  Height:   Ht Readings from Last 1 Encounters:  02/27/17 5\' 9"  (1.753 m)    Weight:   Wt Readings from Last 1 Encounters:  02/27/17 169 lb 1.5 oz (76.7 kg)    Ideal Body Weight:  72.73 kg  BMI:  Body mass index is 24.97 kg/m.  Estimated Nutritional Needs:   Kcal:  1920-2150 (25-28 kcal/kg)  Protein:  107-122 grams (1.4-1.6 gram/kg)  Fluid:  >/= 2 L/day  EDUCATION NEEDS:   Education needs no appropriate at this time    Trenton GammonJessica Kijana Cromie, MS, RD, LDN, CNSC Inpatient Clinical Dietitian Pager # [phone] After hours/weekend pager # [phone]

## 2017-02-28 NOTE — Progress Notes (Signed)
Pharmacy Antibiotic Note  Samuel SimsJohn Roth is a 66 y.o. male presented to the ED from Urgent Care  on 02/27/2017 for further workup flu like symptoms and hyperglycemia.  He was also found to have right open foot wound and necrotic fourth and fifth toes.  Vancomycin started on admission for cellulitis/ r/o osteomyelitis.  To add zosyn to abx regimen.  [facility].  Today, 02/28/2017: -  Tmax 100.1, wbc 27.1 -  scr down 1.24 (crcl~59)-- improved with hydration   Plan: - change vancomycin to 1 gm q12h - zosyn 3.375 gm IV q8h (infuse over 4 hours) - daily scr - f/u foot x-ray  ____________________________  Height: 5\' 9"  (175.3 cm) Weight: 169 lb 1.5 oz (76.7 kg) IBW/kg (Calculated) : 70.7  Temp (24hrs), Avg:99.2 F (37.3 C), Min:98.6 F (37 C), Max:100.1 F (37.8 C)   Recent Labs Lab 02/27/17 1644 02/28/17 0436  WBC 29.4* 27.1*  CREATININE 2.10* 1.24  LATICACIDVEN  --  1.1    Estimated Creatinine Clearance: 59.4 mL/min (by C-G formula based on SCr of 1.24 mg/dL).    Allergies  Allergen Reactions  . Ciprofloxacin Hives    Thank you for allowing pharmacy to be a part of this patient's care.  Lucia Gaskinsham, Juley Giovanetti P 02/28/2017 8:35 AM

## 2017-02-28 NOTE — Progress Notes (Signed)
CRITICAL VALUE ALERT  Critical value received: lactic acid 2.4  Date of notification: 02/28/17  Time of notification: 0915  Critical value read back: yes  Nurse who received alert:  Corrie DandyMary Christropher Gintz  MD notified (1st page): Dr Hanley BenAlekh on floor, verbal given  Time of first page:    MD notified (2nd page):   Time of second page:  Responding MD:   Time MD responded:

## 2017-02-28 NOTE — Consult Note (Signed)
Reason for Consult:Foot wound/necrosis Referring Physician: Tarique Roth is an 66 y.o. male.  HPI: Samuel Roth was admitted to the hospital yesterday for a respiratory infection, possibly flu. During his admission he was noticed to have a cellulitic, swollen, and partially necrotic right foot. He has noticed that it's been swollen for about a day but denies pain and has been able to ambulate. He is blind.  Past Medical History:  Diagnosis Date  . Glaucoma   . Legally blind     Past Surgical History:  Procedure Laterality Date  . EYE SURGERY    . TIBIA FRACTURE SURGERY      History reviewed. No pertinent family history.  Social History:  reports that he has quit smoking. He has quit using smokeless tobacco. He reports that he drinks alcohol. He reports that he does not use drugs.  Allergies:  Allergies  Allergen Reactions  . Ciprofloxacin Hives    Medications: I have reviewed the patient's current medications.  Results for orders placed or performed during the hospital encounter of 02/27/17 (from the past 48 hour(s))  CBG monitoring, ED     Status: Abnormal   Collection Time: 02/27/17  3:01 PM  Result Value Ref Range   Glucose-Capillary 313 (H) 65 - 99 mg/dL  Comprehensive metabolic panel     Status: Abnormal   Collection Time: 02/27/17  4:44 PM  Result Value Ref Range   Sodium 131 (L) 135 - 145 mmol/L   Potassium 3.4 (L) 3.5 - 5.1 mmol/L   Chloride 94 (L) 101 - 111 mmol/L   CO2 23 22 - 32 mmol/L   Glucose, Bld 327 (H) 65 - 99 mg/dL   BUN 41 (H) 6 - 20 mg/dL   Creatinine, Ser 2.10 (H) 0.61 - 1.24 mg/dL   Calcium 8.5 (L) 8.9 - 10.3 mg/dL   Total Protein 7.9 6.5 - 8.1 g/dL   Albumin 3.0 (L) 3.5 - 5.0 g/dL   AST 20 15 - 41 U/L   ALT 13 (L) 17 - 63 U/L   Alkaline Phosphatase 155 (H) 38 - 126 U/L   Total Bilirubin 1.2 0.3 - 1.2 mg/dL   GFR calc non Af Amer 31 (L) >60 mL/min   GFR calc Af Amer 36 (L) >60 mL/min    Comment: (NOTE) The eGFR has been calculated using the  CKD EPI equation. This calculation has not been validated in all clinical situations. eGFR's persistently <60 mL/min signify possible Chronic Kidney Disease.    Anion gap 14 5 - 15  CBC with Differential     Status: Abnormal   Collection Time: 02/27/17  4:44 PM  Result Value Ref Range   WBC 29.4 (H) 4.0 - 10.5 K/uL   RBC 4.25 4.22 - 5.81 MIL/uL   Hemoglobin 13.2 13.0 - 17.0 g/dL   HCT 36.8 (L) 39.0 - 52.0 %   MCV 86.6 78.0 - 100.0 fL   MCH 31.1 26.0 - 34.0 pg   MCHC 35.9 30.0 - 36.0 g/dL   RDW 12.7 11.5 - 15.5 %   Platelets 207 150 - 400 K/uL   Neutrophils Relative % 89 %   Lymphocytes Relative 2 %   Monocytes Relative 9 %   Eosinophils Relative 0 %   Basophils Relative 0 %   Neutro Abs 26.2 (H) 1.7 - 7.7 K/uL   Lymphs Abs 0.6 (L) 0.7 - 4.0 K/uL   Monocytes Absolute 2.6 (H) 0.1 - 1.0 K/uL   Eosinophils Absolute 0.0 0.0 -  0.7 K/uL   Basophils Absolute 0.0 0.0 - 0.1 K/uL   WBC Morphology VACUOLATED NEUTROPHILS     Comment: DOHLE BODIES  Procalcitonin - Baseline     Status: None   Collection Time: 02/27/17  4:44 PM  Result Value Ref Range   Procalcitonin 4.43 ng/mL    Comment:        Interpretation: PCT > 2 ng/mL: Systemic infection (sepsis) is likely, unless other causes are known. (NOTE)         ICU PCT Algorithm               Non ICU PCT Algorithm    ----------------------------     ------------------------------         PCT < 0.25 ng/mL                 PCT < 0.1 ng/mL     Stopping of antibiotics            Stopping of antibiotics       strongly encouraged.               strongly encouraged.    ----------------------------     ------------------------------       PCT level decrease by               PCT < 0.25 ng/mL       >= 80% from peak PCT       OR PCT 0.25 - 0.5 ng/mL          Stopping of antibiotics                                             encouraged.     Stopping of antibiotics           encouraged.    ----------------------------      ------------------------------       PCT level decrease by              PCT >= 0.25 ng/mL       < 80% from peak PCT        AND PCT >= 0.5 ng/mL            Continuing antibiotics                                               encouraged.       Continuing antibiotics            encouraged.    ----------------------------     ------------------------------     PCT level increase compared          PCT > 0.5 ng/mL         with peak PCT AND          PCT >= 0.5 ng/mL             Escalation of antibiotics                                          strongly encouraged.      Escalation of antibiotics        strongly encouraged.  Blood gas, venous     Status: Abnormal   Collection Time: 02/27/17  4:46 PM  Result Value Ref Range   pH, Ven 7.367 7.250 - 7.430   pCO2, Ven 42.3 (L) 44.0 - 60.0 mmHg   pO2, Ven BELOW REPORTABLE RANGE 32.0 - 45.0 mmHg    Comment: RBV DR NANAVATI BY ANGIE DUNLAP RRT RCP ON 02/27/17 AT 1650   Bicarbonate 23.7 20.0 - 28.0 mmol/L   Acid-base deficit 1.2 0.0 - 2.0 mmol/L   O2 Saturation 35.8 %   Patient temperature 98.6    Collection site VEIN    Drawn by DRAWN BY RN    Sample type VENOUS   Glucose, capillary     Status: Abnormal   Collection Time: 02/27/17 10:31 PM  Result Value Ref Range   Glucose-Capillary 286 (H) 65 - 99 mg/dL  Comprehensive metabolic panel     Status: Abnormal   Collection Time: 02/28/17  4:36 AM  Result Value Ref Range   Sodium 132 (L) 135 - 145 mmol/L   Potassium 2.9 (L) 3.5 - 5.1 mmol/L   Chloride 98 (L) 101 - 111 mmol/L   CO2 21 (L) 22 - 32 mmol/L   Glucose, Bld 266 (H) 65 - 99 mg/dL   BUN 26 (H) 6 - 20 mg/dL   Creatinine, Ser 1.24 0.61 - 1.24 mg/dL   Calcium 7.6 (L) 8.9 - 10.3 mg/dL   Total Protein 6.0 (L) 6.5 - 8.1 g/dL   Albumin 2.1 (L) 3.5 - 5.0 g/dL   AST 15 15 - 41 U/L   ALT 10 (L) 17 - 63 U/L   Alkaline Phosphatase 124 38 - 126 U/L   Total Bilirubin 1.2 0.3 - 1.2 mg/dL   GFR calc non Af Amer 59 (L) >60 mL/min   GFR calc Af  Amer >60 >60 mL/min    Comment: (NOTE) The eGFR has been calculated using the CKD EPI equation. This calculation has not been validated in all clinical situations. eGFR's persistently <60 mL/min signify possible Chronic Kidney Disease.    Anion gap 13 5 - 15  CBC with Differential/Platelet     Status: Abnormal   Collection Time: 02/28/17  4:36 AM  Result Value Ref Range   WBC 27.1 (H) 4.0 - 10.5 K/uL   RBC 3.35 (L) 4.22 - 5.81 MIL/uL   Hemoglobin 10.3 (L) 13.0 - 17.0 g/dL    Comment: DELTA CHECK NOTED REPEATED TO VERIFY    HCT 28.9 (L) 39.0 - 52.0 %   MCV 86.3 78.0 - 100.0 fL   MCH 30.7 26.0 - 34.0 pg   MCHC 35.6 30.0 - 36.0 g/dL   RDW 12.9 11.5 - 15.5 %   Platelets 218 150 - 400 K/uL   Neutrophils Relative % 88 %   Lymphocytes Relative 3 %   Monocytes Relative 9 %   Eosinophils Relative 0 %   Basophils Relative 0 %   Neutro Abs 23.9 (H) 1.7 - 7.7 K/uL   Lymphs Abs 0.8 0.7 - 4.0 K/uL   Monocytes Absolute 2.4 (H) 0.1 - 1.0 K/uL   Eosinophils Absolute 0.0 0.0 - 0.7 K/uL   Basophils Absolute 0.0 0.0 - 0.1 K/uL   WBC Morphology TOXIC GRANULATION     Comment: DOHLE BODIES  Lactic acid, plasma     Status: None   Collection Time: 02/28/17  4:36 AM  Result Value Ref Range   Lactic Acid, Venous 1.1 0.5 - 1.9 mmol/L  Glucose, capillary  Status: Abnormal   Collection Time: 02/28/17  7:57 AM  Result Value Ref Range   Glucose-Capillary 241 (H) 65 - 99 mg/dL  Lactic acid, plasma     Status: Abnormal   Collection Time: 02/28/17  8:20 AM  Result Value Ref Range   Lactic Acid, Venous 2.4 (HH) 0.5 - 1.9 mmol/L    Comment: CRITICAL RESULT CALLED TO, READ BACK BY AND VERIFIED WITH: M.FEARS RN [phone] A.[email or web address]   Glucose, capillary     Status: Abnormal   Collection Time: 02/28/17 12:22 PM  Result Value Ref Range   Glucose-Capillary 225 (H) 65 - 99 mg/dL    Dg Chest 2 View  Result Date: 02/27/2017 CLINICAL DATA:  Acute onset of productive cough and hyperglycemia. Back aches  and malaise. Anorexia. Low-grade fever. Initial encounter. EXAM: CHEST  2 VIEW COMPARISON:  None. FINDINGS: The lungs are well-aerated and clear. There is no evidence of focal opacification, pleural effusion or pneumothorax. The heart is normal in size; the mediastinal contour is within normal limits. No acute osseous abnormalities are seen. IMPRESSION: No acute cardiopulmonary process seen. Electronically Signed   By: Garald Balding M.D.   On: 02/27/2017 18:30   US Renal  Result Date: 02/28/2017 CLINICAL DATA:  Acute onset of renal failure.  Initial encounter. EXAM: RENAL / URINARY TRACT ULTRASOUND COMPLETE COMPARISON:  CT of the abdomen and pelvis performed 08/15/2007 FINDINGS: Right Kidney: Length: 9.2 cm. Echogenicity within normal limits. No mass or hydronephrosis visualized. Left Kidney: Length: 11.0 cm. Echogenicity within normal limits. No mass or hydronephrosis visualized. Bladder: Appears normal for degree of bladder distention. IMPRESSION: Unremarkable renal ultrasound.  No evidence of hydronephrosis. Electronically Signed   By: Garald Balding M.D.   On: 02/28/2017 01:52   Dg Foot 2 Views Right  Result Date: 02/28/2017 CLINICAL DATA:  Left foot open wound and cellulitis. EXAM: RIGHT FOOT - 2 VIEW COMPARISON:  None FINDINGS: Diffuse soft tissue swelling is noted. Gas is noted within the soft tissues of the foot, greatest along the first metatarsal and great toe. No radiographic evidence of acute osteomyelitis noted. No evidence of acute fracture, subluxation or dislocation. A small to moderate calcaneal spur is present. IMPRESSION: Soft tissue gas and swelling suspicious for gas-forming/ necrotizing infection. No radiographic evidence of acute osteomyelitis. Electronically Signed   By: Margarette Canada M.D.   On: 02/28/2017 08:45    Review of Systems  Constitutional: Positive for chills and fever. Negative for weight loss.  HENT: Negative for ear discharge, ear pain, hearing loss and tinnitus.    Eyes: Negative for blurred vision, double vision, photophobia and pain.  Respiratory: Positive for cough. Negative for sputum production and shortness of breath.   Cardiovascular: Negative for chest pain.  Gastrointestinal: Negative for abdominal pain, nausea and vomiting.  Genitourinary: Negative for dysuria, flank pain, frequency and urgency.  Musculoskeletal: Negative for back pain, falls, joint pain, myalgias and neck pain.  Neurological: Negative for dizziness, tingling, sensory change, focal weakness, loss of consciousness and headaches.  Endo/Heme/Allergies: Does not bruise/bleed easily.  Psychiatric/Behavioral: Negative for depression, memory loss and substance abuse. The patient is not nervous/anxious.    Blood pressure (!) 160/70, pulse 90, temperature 98.2 F (36.8 C), temperature source Oral, resp. rate 20, height 5' 9"  (1.753 m), weight 76.7 kg (169 lb 1.5 oz), SpO2 100 %. Physical Exam  Constitutional: He appears well-developed and well-nourished.  HENT:  Head: Normocephalic.  Eyes: Right eye exhibits no discharge. Left eye exhibits no discharge.  Cardiovascular: Normal rate and regular rhythm.   Respiratory: Effort normal. No respiratory distress.  Musculoskeletal:  RLE No traumatic wounds or ecchymosis, diffuse erythema to ankle, 1st and 2nd toes with e/o early necrosis, malodorous, superficial skin sloughing medially  Nontender  No effusions  Knee stable to varus/ valgus and anterior/posterior stress  Sens DPN paresthetic, SPN, TN absent  Motor EHL 4/5, ext, flex, evers 5/5  DP 2+, PT 2+, 2+ NP edema   LLE No traumatic wounds, ecchymosis, or rash  Nontender  No effusions  Knee stable to varus/ valgus and anterior/posterior stress  Sens DPN, SPN paresthetic, TN intact  Motor EHL, ext, flex, evers 5/5  DP 2+, PT 2+, No significant edema     Neurological: He is alert.  Skin: Skin is warm and dry.  Psychiatric: He has a normal mood and affect. His behavior is  normal.    Assessment/Plan: Right foot cellulitis with 1st and 2nd toe necrosis -- Dr. Sharol Given to evaluate tomorrow, suspect some sort of amputation as a result. Will make NPO after MN, local wound care to foot. BLE neuropathy -- Likely 2/2 undiagnosed DM, aggressive glucose control ARI -- per primary service    Lisette Abu, PA-C Orthopedic Surgery [phone] 02/28/2017, 1:24 PM

## 2017-02-28 NOTE — Progress Notes (Signed)
Placed Xeroform and Kurlex on patient's right foot per PA.

## 2017-02-28 NOTE — Consult Note (Signed)
WOC Nurse wound consult note Reason for Consult:Right foot osteomyelitis.  Scheduled [facility]  Left foot is intact. Will defer to orthopedic surgery for ongoing post surgical care.  Please reconsult if needed.   Wound type:Infectious Pressure Injury POA: N/A Measurement:Necrotic transmetatarsal area to right foot with purulence, edema and erythema to right foot.   Wound bed: Drainage (amount, consistency, odor) Purulence with necrotic odor.  Periwound:edema, erythema Dressing procedure/placement/frequency:[facility]  Will defer to surgical services. Will not follow at this time.  Please re-consult if needed.  Maple HudsonKaren Sumit Branham RN BSN CWON Pager [phone]

## 2017-02-28 NOTE — Evaluation (Signed)
Physical Therapy Evaluation Patient Details Name: Samuel SimsJohn Robello MRN: [id number] DOB: February 01, 1951 Today's Date: 02/28/2017   History of Present Illness  Samuel SimsJohn Konopka is a 66 y.o. male with medical history significant of glaucoma who is legally blind presented with complaints of flu-like symptoms, cough with whitish expectoration associated with fever, chills, malaise, decreased appetite and decreased urine output, nausea and intermittent vomiting; xray R foot = Soft tissue gas and swelling suspicious for gas-forming/ necrotizing, scheduled for surgery with Dr. Lajoyce Cornersuda, [facility]  Clinical Impression  Pt admitted with above diagnosis. Pt currently with functional limitations due to the deficits listed below (see PT Problem List). * Pt will benefit from skilled PT to increase their independence and safety with mobility to allow discharge to the venue listed below.    Recommend SNF with anticipated right foot surgery but will continue to follow assess for needs;      Follow Up Recommendations SNF    Equipment Recommendations  Other (comment) (TBD)    Recommendations for Other Services       Precautions / Restrictions Precautions Precautions: Fall;Other (comment) Precaution Comments: legally blind      Mobility  Bed Mobility Overal bed mobility: Needs Assistance Bed Mobility: Supine to Sit;Sit to Supine     Supine to sit: Supervision Sit to supine: Supervision   General bed mobility comments: for safety and line management  Transfers                 General transfer comment: NT d/t pt with severe N/V and pendin surgery R foot/necrotic toes  Ambulation/Gait                Stairs            Wheelchair Mobility    Modified Rankin (Stroke Patients Only)       Balance Overall balance assessment: Needs assistance (denies falls) Sitting-balance support: No upper extremity supported;Feet supported Sitting balance-Leahy Scale: Fair                                        Pertinent Vitals/Pain Pain Assessment: No/denies pain    Home Living Family/patient expects to be discharged to:: Private residence Living Arrangements: Spouse/significant other   Type of Home: Apartment Home Access: Stairs to enter   [employer] CorporationEntrance Stairs-Number of Steps: 2 flights Home Layout: One level Home Equipment: Cane - single point Additional Comments: reports his wife can't care for him because she is a "Thalidomide baby"    Prior Function Level of Independence: Independent;Independent with assistive device(s)               Hand Dominance        Extremity/Trunk Assessment   Upper Extremity Assessment Upper Extremity Assessment: Defer to OT evaluation;Generalized weakness    Lower Extremity Assessment Lower Extremity Assessment: Generalized weakness;RLE deficits/detail RLE Sensation: decreased light touch       Communication   Communication: No difficulties  Cognition Arousal/Alertness: Awake/alert Behavior During Therapy: WFL for tasks assessed/performed Overall Cognitive Status: Within Functional Limits for tasks assessed                                        General Comments General comments (skin integrity, edema, etc.): necrotic appearing 4th and 5th toes right foot    Exercises  Assessment/Plan    PT Assessment Patient needs continued PT services  PT Problem List Decreased activity tolerance;Decreased knowledge of use of DME;Decreased mobility;Impaired sensation;Decreased strength       PT Treatment Interventions DME instruction;Gait training;Functional mobility training;Therapeutic activities;Therapeutic exercise;Patient/family education    PT Goals (Current goals can be found in the Care Plan section)  Acute Rehab PT Goals Patient Stated Goal: to feel better PT Goal Formulation: With patient Time For Goal Achievement: 03/07/17 Potential to Achieve Goals: Good    Frequency Min  3X/week   Barriers to discharge        Co-evaluation               AM-PAC PT "6 Clicks" Daily Activity  Outcome Measure Difficulty turning over in bed (including adjusting bedclothes, sheets and blankets)?: A Little Difficulty moving from lying on back to sitting on the side of the bed? : A Little Difficulty sitting down on and standing up from a chair with arms (e.g., wheelchair, bedside commode, etc,.)?: A Little Help needed moving to and from a bed to chair (including a wheelchair)?: A Little Help needed walking in hospital room?: A Lot Help needed climbing 3-5 steps with a railing? : A Lot 6 Click Score: 16    End of Session   Activity Tolerance: Patient limited by fatigue;Other (comment) (N/V) Patient left: in bed;with call bell/phone within reach;with bed alarm set   PT Visit Diagnosis: Muscle weakness (generalized) (M62.81)    Time: 1610-9604 PT Time Calculation (min) (ACUTE ONLY): 14 min   Charges:   PT Evaluation $PT Eval Moderate Complexity: 1 Procedure     PT G CodesDrucilla Chalet, PT Pager: [phone] 02/28/2017   [facility] 02/28/2017, 11:09 AM

## 2017-02-28 NOTE — Progress Notes (Signed)
Patient requests rails on bed up.

## 2017-02-28 NOTE — Progress Notes (Signed)
Patient ID: Samuel Roth, male   DOB: 1951/05/10, 66 y.o.   MRN: [id number]  PROGRESS NOTE    Samuel Roth  GLO:[id number] DOB: 1951/04/07 DOA: 02/27/2017 PCP: Patient, No Pcp Per   Brief Narrative:  66 y.o. male with medical history significant of glaucoma who is legally blind presented with complaints of flulike symptoms for the last 5-6 days with fever, chills cough and decreased urine output. He was diagnosed with flu and hyperglycemia at an urgent care facility. He was admitted with leukocytosis, acute kidney injury and influenza bronchitis. He was also found to have severe right foot cellulitis with probable osteomyelitis and almost necrotic toes. He was started on broad-spectrum antibiotics. I spoke to Dr. Eulah Pont from orthopedics today and he recommends the patient to be transferred to [facility] for probable amputation tomorrow by Dr. Lajoyce Corners. We will transfer the patient to [facility].  Assessment & Plan:   Principal Problem:   Acute kidney injury [facility]) Active Problems:   Hyperglycemia   Leukocytosis  1. Right foot severe cellulitis with probable acute osteomyelitis with open wounds and almost necrotic appearing fourth and fifth toes: - Continue vancomycin dosed as per pharmacy. Add Zosyn. Spoke to Dr. Eulah Pont from orthopedics and he recommends the patient to be transferred to [facility] for probable amputation tomorrow by Dr. Lajoyce Corners. We will keep the patient nothing by mouth for tomorrow morning. Cancel MRI  -Repeat a.m. labs including CRP   2. Dyspnea: Probably due to influenza bronchitis - Oxygen supplementation if needed  3. Probable influenza bronchitis:  - Supportive treatment. Tamiflu for 5 days. Repeat chest x-ray - If respiratory status does not improve, we will need to get CT scan of the chest  4. Acute kidney injury: - Probably due to dehydration. Improving -  Continue IV fluids normal saline with intravenous potassium . renal ultrasound was  negative for hydronephrosis. Repeat creatinine in a.m. Avoid nephrotoxic medications including oral glaucoma medication  4. Hyperglycemia: Rule out newly diagnosed diabetes - Check HbA1c - SSI for now. Consult diabetes coordinator.  - May need outpatient follow-up with primary care provider  5. History of glaucoma and legally blind: Continue with home eyedrops. Hold oral medication  6. Hyponatremia and hypokalemia: Probably due to dehydration.  Continue IV fluids with intravenous potassium. Repeat a.m. Labs  7. Leukocytosis: Probably secondary to #1. Continue antibiotics. Repeat a.m. labs. Follow cultures.     DVT prophylaxis:  heparin  Code Status:   full  Family Communication:  none present at bedside  Disposition Plan:Transfer the patient to [facility]   Consultants: Orthopedics  Procedures: None   Antimicrobials: Vancomycin and Zosyn from 02/28/2017   Subjective: Patient seen and examined at bedside. She complains of nausea and occasional vomiting. He also is complaining of cough and shortness of breath. He does not remember how long he has a right foot wound or if he has a right foot wound.  Objective: Vitals:   02/27/17 1806 02/27/17 1921 02/27/17 2000 02/28/17 0620  BP: 130/71 (!) 141/87 (!) 142/82 (!) 136/54  Pulse: 87 96 98 96  Resp: 20 20 18 16   Temp:   98.6 F (37 C) 100.1 F (37.8 C)  TempSrc:   Oral Oral  SpO2: 100% 98% 98% 96%  Weight:   76.7 kg (169 lb 1.5 oz)   Height:   5\' 9"  (1.753 m)     Intake/Output Summary (Last 24 hours) at 02/28/17 0817 Last data filed at 02/28/17 [phone]  Gross per 24 hour  Intake                0 ml  Output              550 ml  Net             -550 ml   Filed Weights   02/27/17 2000  Weight: 76.7 kg (169 lb 1.5 oz)    Examination:  General exam: Appears calm and comfortable  Respiratory system: Bilateral decreased breath sound at bases With scattered crackles  Cardiovascular system: S1 & S2 heard, rate  controlled Gastrointestinal system: Abdomen is nondistended, soft and nontender. Normal bowel sounds heard. Central nervous system: Alert and oriented. No focal neurological deficits. Moving extremities Extremities: No cyanosis, clubbingtrace pedal edema Skin:  right foot has a dressing. Fourth and fifth toes look cross cyanotic/almost necrotic. There is diffuse erythema of the toes, foot extending up to mid shin along with open wounds on the right foot dorsally with some foul odor to the foot. Psychiatry: Judgement and insight appear normal. Mood & affect appropriate.     Data Reviewed: I have personally reviewed following labs and imaging studies  CBC:  Recent Labs Lab 02/27/17 1644 02/28/17 0436  WBC 29.4* 27.1*  NEUTROABS 26.2* 23.9*  HGB 13.2 10.3*  HCT 36.8* 28.9*  MCV 86.6 86.3  PLT 207 218   Basic Metabolic Panel:  Recent Labs Lab 02/27/17 1644 02/28/17 0436  NA 131* 132*  K 3.4* 2.9*  CL 94* 98*  CO2 23 21*  GLUCOSE 327* 266*  BUN 41* 26*  CREATININE 2.10* 1.24  CALCIUM 8.5* 7.6*   GFR: Estimated Creatinine Clearance: 59.4 mL/min (by C-G formula based on SCr of 1.24 mg/dL). Liver Function Tests:  Recent Labs Lab 02/27/17 1644 02/28/17 0436  AST 20 15  ALT 13* 10*  ALKPHOS 155* 124  BILITOT 1.2 1.2  PROT 7.9 6.0*  ALBUMIN 3.0* 2.1*   No results for input(s): LIPASE, AMYLASE in the last 168 hours. No results for input(s): AMMONIA in the last 168 hours. Coagulation Profile: No results for input(s): INR, PROTIME in the last 168 hours. Cardiac Enzymes: No results for input(s): CKTOTAL, CKMB, CKMBINDEX, TROPONINI in the last 168 hours. BNP (last 3 results) No results for input(s): PROBNP in the last 8760 hours. HbA1C: No results for input(s): HGBA1C in the last 72 hours. CBG:  Recent Labs Lab 02/27/17 1501 02/27/17 2231 02/28/17 0757  GLUCAP 313* 286* 241*   Lipid Profile: No results for input(s): CHOL, HDL, LDLCALC, TRIG, CHOLHDL,  LDLDIRECT in the last 72 hours. Thyroid Function Tests: No results for input(s): TSH, T4TOTAL, FREET4, T3FREE, THYROIDAB in the last 72 hours. Anemia Panel: No results for input(s): VITAMINB12, FOLATE, FERRITIN, TIBC, IRON, RETICCTPCT in the last 72 hours. Sepsis Labs:  Recent Labs Lab 02/27/17 1644 02/28/17 0436  PROCALCITON 4.43  --   LATICACIDVEN  --  1.1    No results found for this or any previous visit (from the past 240 hour(s)).       Radiology Studies: Dg Chest 2 View  Result Date: 02/27/2017 CLINICAL DATA:  Acute onset of productive cough and hyperglycemia. Back aches and malaise. Anorexia. Low-grade fever. Initial encounter. EXAM: CHEST  2 VIEW COMPARISON:  None. FINDINGS: The lungs are well-aerated and clear. There is no evidence of focal opacification, pleural effusion or pneumothorax. The heart is normal in size; the mediastinal contour is within normal limits. No acute osseous abnormalities are seen.  IMPRESSION: No acute cardiopulmonary process seen. Electronically Signed   By: Roanna RaiderJeffery  Chang M.D.   On: 02/27/2017 18:30   [place] Renal  Result Date: 02/28/2017 CLINICAL DATA:  Acute onset of renal failure.  Initial encounter. EXAM: RENAL / URINARY TRACT ULTRASOUND COMPLETE COMPARISON:  CT of the abdomen and pelvis performed 08/15/2007 FINDINGS: Right Kidney: Length: 9.2 cm. Echogenicity within normal limits. No mass or hydronephrosis visualized. Left Kidney: Length: 11.0 cm. Echogenicity within normal limits. No mass or hydronephrosis visualized. Bladder: Appears normal for degree of bladder distention. IMPRESSION: Unremarkable renal ultrasound.  No evidence of hydronephrosis. Electronically Signed   By: Roanna RaiderJeffery  Chang M.D.   On: 02/28/2017 01:52        Scheduled Meds: . brimonidine  1 drop Both Eyes TID  . brinzolamide  1 drop Left Eye TID  . heparin  5,000 Units Subcutaneous Q8H  . insulin aspart  0-15 Units Subcutaneous TID WC  . insulin aspart  0-5 Units  Subcutaneous QHS  . latanoprost  1 drop Left Eye QHS  . oseltamivir  30 mg Oral BID  . pilocarpine  1 drop Left Eye QID  . sodium chloride flush  3 mL Intravenous Q12H  . timolol  1 drop Left Eye BID   Continuous Infusions: . 0.9 % NaCl with KCl 40 mEq / L    . piperacillin-tazobactam (ZOSYN)  IV    . vancomycin       LOS: 0 days        Glade LloydKshitiz Laniece Hornbaker, MD [facility] Pager [phone]  If 7PM-7AM, please contact night-coverage [email or web address] Password [facility] 02/28/2017, 8:17 AM

## 2017-02-28 NOTE — Consult Note (Signed)
ORTHOPAEDIC CONSULTATION  REQUESTING PHYSICIAN: Samuel Lloyd, MD  Chief Complaint: open wound right foot  HPI: Samuel Roth is a 66 y.o. male who presents with ulceration to his right foot. This has been present for an unknown period of time. Patient reports that he was unaware that he had any issue with his foot until he had difficulty putting his shoe on within the last week due to swelling per his report. Patient is currently admitted for the flu while admitted nursing noted open wound to his right foot.  States was unaware that he had diabetes until today.  Plain films were revealing for gas concerning for osteomyelitis.  Patient is blind.  Past Medical History:  Diagnosis Date  . Glaucoma   . Legally blind    Past Surgical History:  Procedure Laterality Date  . EYE SURGERY    . TIBIA FRACTURE SURGERY     Social History   Social History  . Marital status: Married    Spouse name: N/A  . Number of children: N/A  . Years of education: N/A   Social History Main Topics  . Smoking status: Former Games developer  . Smokeless tobacco: Former Neurosurgeon  . Alcohol use Yes     Comment: social  . Drug use: No  . Sexual activity: Not Asked   Other Topics Concern  . None   Social History Narrative  . None   History reviewed. No pertinent family history. - negative except otherwise stated in the family history section Allergies  Allergen Reactions  . Ciprofloxacin Hives   Prior to Admission medications   Medication Sig Start Date End Date Taking? Authorizing Provider  brimonidine (ALPHAGAN P) 0.1 % SOLN Place 1 drop into both eyes 3 (three) times daily. 12/15/16  Yes [provider]  brinzolamide (AZOPT) 1 % ophthalmic suspension Place 1 drop into the left eye 3 (three) times daily. 01/01/17  Yes [provider]  methazolamide (NEPTAZANE) 50 MG tablet Take 50 mg by mouth 3 (three) times daily. 02/02/17  Yes [provider]  pilocarpine (PILOCAR) 2 %  ophthalmic solution Place 1 drop into the left eye 4 (four) times daily. 01/01/17  Yes [provider]  timolol (TIMOPTIC) 0.5 % ophthalmic solution Place 1 drop into the left eye 2 (two) times daily. 01/01/17  Yes [provider]  Travoprost, BAK Free, (TRAVATAN Z) 0.004 % SOLN ophthalmic solution Place 1 drop into the left eye daily. 12/15/16  Yes [provider]   Dg Chest 2 View  Result Date: 02/27/2017 CLINICAL DATA:  Acute onset of productive cough and hyperglycemia. Back aches and malaise. Anorexia. Low-grade fever. Initial encounter. EXAM: CHEST  2 VIEW COMPARISON:  None. FINDINGS: The lungs are well-aerated and clear. There is no evidence of focal opacification, pleural effusion or pneumothorax. The heart is normal in size; the mediastinal contour is within normal limits. No acute osseous abnormalities are seen. IMPRESSION: No acute cardiopulmonary process seen. Electronically Signed   By: Roanna Raider M.D.   On: 02/27/2017 18:30   US Renal  Result Date: 02/28/2017 CLINICAL DATA:  Acute onset of renal failure.  Initial encounter. EXAM: RENAL / URINARY TRACT ULTRASOUND COMPLETE COMPARISON:  CT of the abdomen and pelvis performed 08/15/2007 FINDINGS: Right Kidney: Length: 9.2 cm. Echogenicity within normal limits. No mass or hydronephrosis visualized. Left Kidney: Length: 11.0 cm. Echogenicity within normal limits. No mass or hydronephrosis visualized. Bladder: Appears normal for degree of bladder distention. IMPRESSION: Unremarkable renal ultrasound.  No evidence  of hydronephrosis. Electronically Signed   By: Roanna RaiderJeffery  Chang M.D.   On: 02/28/2017 01:52   Dg Foot 2 Views Right  Result Date: 02/28/2017 CLINICAL DATA:  Left foot open wound and cellulitis. EXAM: RIGHT FOOT - 2 VIEW COMPARISON:  None FINDINGS: Diffuse soft tissue swelling is noted. Gas is noted within the soft tissues of the foot, greatest along the first metatarsal and great toe. No radiographic evidence of  acute osteomyelitis noted. No evidence of acute fracture, subluxation or dislocation. A small to moderate calcaneal spur is present. IMPRESSION: Soft tissue gas and swelling suspicious for gas-forming/ necrotizing infection. No radiographic evidence of acute osteomyelitis. Electronically Signed   By: Harmon PierJeffrey  Hu M.D.   On: 02/28/2017 08:45   - pertinent xrays, CT, MRI studies were reviewed and independently interpreted  Positive ROS: All other systems have been reviewed and were otherwise negative with the exception of those mentioned in the HPI and as above.  Physical Exam: General: Alert, no acute distress Cardiovascular: No pedal edema Respiratory: No cyanosis, no use of accessory musculature GI: No organomegaly, abdomen is soft and non-tender Skin: Ulceration to the first metatarsal head, plantar aspect. There is exposed first metatarsal and the wound bed. Ulceration extending laterally. There are gangrenous changes to the first and second toes. Blistering extending dorsally. There is erythema and pitting edema. Foot is cellulitic. There is pitting edema up to the tibial tubercle. There is warmth.  no drainage or purulence. Does have a foul odor.  Neurologic: Sensation intact distally Psychiatric: Patient is competent for consent with normal mood and affect Lymphatic: No axillary or cervical lymphadenopathy  MUSCULOSKELETAL: does have a strong dorsalis pedis pulse.   Assessment: Gangrene and osteomyelitis right foot  Plan: We'll plan for her transmetatarsal amputation tomorrow. Dr. Lajoyce Cornersuda will evaluate in the morning.  Have applied a dry dressing with an Ace wrap. Keep a dressing on at all times.  Thank you for the consult and the opportunity to see [name]  Marcus Duda, MD [facility] [phone] 4:19 PM

## 2017-02-28 NOTE — Progress Notes (Signed)
Peripherally Inserted Central Catheter/Midline Placement  The IV Nurse has discussed with the patient and/or persons authorized to consent for the patient, the purpose of this procedure and the potential benefits and risks involved with this procedure.  The benefits include less needle sticks, lab draws from the catheter, and the patient may be discharged home with the catheter. Risks include, but not limited to, infection, bleeding, blood clot (thrombus formation), and puncture of an artery; nerve damage and irregular heartbeat and possibility to perform a PICC exchange if needed/ordered by physician.  Alternatives to this procedure were also discussed.  Bard Power PICC patient education guide, fact sheet on infection prevention and patient information card has been provided to patient /or left at bedside.    PICC/Midline Placement Documentation  PICC Single Lumen 02/28/17 PICC Right Basilic 39 cm (Active)  Indication for Insertion or Continuance of Line Poor Vasculature-patient has had multiple peripheral attempts or PIVs lasting less than 24 hours 02/28/2017  3:00 PM  Dressing Change Due 03/07/17 02/28/2017  3:00 PM       Stacie GlazeJoyce, Korri Ask Horton 02/28/2017, 3:14 PM

## 2017-02-28 NOTE — Progress Notes (Signed)
Pharmacy Antibiotic Note  Jory SimsJohn Raburn is a 66 y.o. male admitted on 02/27/2017 with cellulitis/osteomyelitis.  Pharmacy has been consulted for Vancomycin dosing.  Plan: Vancomycin 1gm IV every 24 hours.  Goal trough 15-20 mcg/mL.  Height: 5\' 9"  (175.3 cm) Weight: 169 lb 1.5 oz (76.7 kg) IBW/kg (Calculated) : 70.7  Temp (24hrs), Avg:98.9 F (37.2 C), Min:98.6 F (37 C), Max:99.3 F (37.4 C)   Recent Labs Lab 02/27/17 1644  WBC 29.4*  CREATININE 2.10*    Estimated Creatinine Clearance: 35.1 mL/min (A) (by C-G formula based on SCr of 2.1 mg/dL (H)).    Allergies  Allergen Reactions  . Ciprofloxacin Hives    Antimicrobials this admission: Vancomycin 02/28/2017 >>   Dose adjustments this admission:   Microbiology results: pending  Thank you for allowing pharmacy to be a part of this patient's care.  Aleene DavidsonGrimsley Jr, [place] Chelf Crowford 02/28/2017 3:19 AM

## 2017-02-28 NOTE — Progress Notes (Addendum)
RN told NP that pt's right foot had wounds on it and it didn't appear to have been addressed on admit. Later, NP was able to go to bedside and evaluate. S: pt says he doesn't know how long his foot has been this way. States he "can't feel much". Didn't really "hurt". Stated it just felt funny when he put his shoes on. When asked about pain now, he denies. (pt is legally blind). When asked about last time he saw a MD, he replied "about 16 years ago".  O: Non toxic appearing WM in NAD. + nausea with minor amount of vomiting. BP and HR stable. Low grade fever. Right lower extremity is reddened from lower tibia to toes. There are open wounds on right foot-dorsally and underneath. Open skin to distal interior foot with gray/peeling skin with necrotic areas to toes and wounds on underside of foot. + redness to toes with swelling. Pitting edema to dorsal foot. + pedal pulse. + warmth to skin. + foul odor to foot. Sensation decreased to Right foot/ankle. LLE is not swollen or red and is without open wounds. Sensation is fully intact to LLE.  A/P: 66 yo gentleman admitted yesterday with flu-like sx and + for flu. It doesn't appear that pt removed shoe or sock in ED, so RN found wound on arrival to unit.  1. Open wounds with severe cellulitis ? Osteomyelitis to RLE. Wound consult placed. Will get plain films and order MRI for this am. Will likely need ortho consult. WBCC 29000. Vancomycin started per pharmacy. Dsg placed until seen by wound nurse, attending or ortho today. Will await xray results. Recheck labs this am and include a lactic acid. Blood cultures were done on admit.  KJKG, NP [facility]  Total critical care time: 30 minutes Critical care time was exclusive of separately billable procedures and treating other patients. Critical care was necessary to treat or prevent imminent or life-threatening deterioration. Critical care was time spent personally by me on the following activities: development of treatment  plan with patient and/or surrogate as well as nursing, discussions with consultants, evaluation of patient's response to treatment, examination of patient, obtaining history from patient or surrogate, ordering and performing treatments and interventions, ordering and review of laboratory studies, ordering and review of radiographic studies, pulse oximetry and re-evaluation of patient's condition.

## 2017-02-28 NOTE — Progress Notes (Addendum)
Inpatient Diabetes Program Recommendations  AACE/ADA: New Consensus Statement on Inpatient Glycemic Control (2015)  Target Ranges:  Prepandial:   less than 140 mg/dL      Peak postprandial:   less than 180 mg/dL (1-2 hours)      Critically ill patients:  140 - 180 mg/dL   Lab Results  Component Value Date   GLUCAP 225 (H) 02/28/2017    Review of Glycemic ControlResults for Samuel SimsSTES, Samuel (MRN [id number]) as of 02/28/2017 13:15  Ref. Range 02/27/2017 15:01 02/27/2017 22:31 02/28/2017 07:57 02/28/2017 12:22  Glucose-Capillary Latest Ref Range: 65 - 99 mg/dL 409313 (H) 811286 (H) 914241 (H) 225 (H)   Diabetes history: New onset diabetes Outpatient Diabetes medications: None  Current orders for Inpatient glycemic control:  Novolog moderate tid with meals and HS Inpatient Diabetes Program Recommendations:   Please consider adding basal insulin such as Lantus 15 units daily. While NPO, consider increasing Novolog to q 4 hours.  Attempted to speak with patient regarding new diagnosis of diabetes.  Patient sleeping and did not appear to feel like discussing at this time. Discussed with RN.  Will follow.  Thanks, Beryl MeagerJenny Shenaya Lebo, RN, BC-ADM Inpatient Diabetes Coordinator Pager [phone] (8a-5p)

## 2017-03-01 ENCOUNTER — Encounter (HOSPITAL_COMMUNITY)
Admission: EM | Disposition: A | Discharge status: Skilled Nursing Facility | Payer: Self-pay | Source: Home / Self Care | Attending: Emergency Medicine

## 2017-03-01 ENCOUNTER — Inpatient Hospital Stay (HOSPITAL_COMMUNITY): Payer: BC Managed Care – PPO | Admitting: Anesthesiology

## 2017-03-01 ENCOUNTER — Encounter (HOSPITAL_COMMUNITY): Payer: Self-pay | Admitting: Certified Registered Nurse Anesthetist

## 2017-03-01 DIAGNOSIS — I96 Gangrene, not elsewhere classified: Secondary | ICD-10-CM

## 2017-03-01 DIAGNOSIS — N179 Acute kidney failure, unspecified: Secondary | ICD-10-CM

## 2017-03-01 DIAGNOSIS — D72829 Elevated white blood cell count, unspecified: Secondary | ICD-10-CM

## 2017-03-01 DIAGNOSIS — J101 Influenza due to other identified influenza virus with other respiratory manifestations: Secondary | ICD-10-CM

## 2017-03-01 DIAGNOSIS — E1142 Type 2 diabetes mellitus with diabetic polyneuropathy: Secondary | ICD-10-CM

## 2017-03-01 DIAGNOSIS — M86171 Other acute osteomyelitis, right ankle and foot: Secondary | ICD-10-CM

## 2017-03-01 DIAGNOSIS — E119 Type 2 diabetes mellitus without complications: Secondary | ICD-10-CM

## 2017-03-01 HISTORY — PX: AMPUTATION: SHX166

## 2017-03-01 LAB — POCT I-STAT 4, (NA,K, GLUC, HGB,HCT)
Glucose, Bld: 178 mg/dL — ABNORMAL HIGH (ref 65–99)
HCT: 41 % (ref 39.0–52.0)
Hemoglobin: 13.9 g/dL (ref 13.0–17.0)
Potassium: 3.2 mmol/L — ABNORMAL LOW (ref 3.5–5.1)
Sodium: 138 mmol/L (ref 135–145)

## 2017-03-01 LAB — CBC WITH DIFFERENTIAL/PLATELET
Basophils Absolute: 0 10*3/uL (ref 0.0–0.1)
Basophils Relative: 0 %
Eosinophils Absolute: 0 10*3/uL (ref 0.0–0.7)
Eosinophils Relative: 0 %
HCT: 33.4 % — ABNORMAL LOW (ref 39.0–52.0)
Hemoglobin: 11.7 g/dL — ABNORMAL LOW (ref 13.0–17.0)
Lymphocytes Relative: 4 %
Lymphs Abs: 0.9 10*3/uL (ref 0.7–4.0)
MCH: 30.2 pg (ref 26.0–34.0)
MCHC: 35 g/dL (ref 30.0–36.0)
MCV: 86.1 fL (ref 78.0–100.0)
Monocytes Absolute: 2.4 10*3/uL — ABNORMAL HIGH (ref 0.1–1.0)
Monocytes Relative: 10 %
Neutro Abs: 20.2 10*3/uL — ABNORMAL HIGH (ref 1.7–7.7)
Neutrophils Relative %: 86 %
Platelets: 242 10*3/uL (ref 150–400)
RBC: 3.88 MIL/uL — ABNORMAL LOW (ref 4.22–5.81)
RDW: 12.7 % (ref 11.5–15.5)
WBC: 23.5 10*3/uL — ABNORMAL HIGH (ref 4.0–10.5)

## 2017-03-01 LAB — BASIC METABOLIC PANEL
Anion gap: 8 (ref 5–15)
BUN: 19 mg/dL (ref 6–20)
CO2: 26 mmol/L (ref 22–32)
Calcium: 7.6 mg/dL — ABNORMAL LOW (ref 8.9–10.3)
Chloride: 103 mmol/L (ref 101–111)
Creatinine, Ser: 1.08 mg/dL (ref 0.61–1.24)
GFR calc Af Amer: >60 mL/min (ref >60)
GFR calc non Af Amer: >60 mL/min (ref >60)
Glucose, Bld: 176 mg/dL — ABNORMAL HIGH (ref 65–99)
Potassium: 3.3 mmol/L — ABNORMAL LOW (ref 3.5–5.1)
Sodium: 137 mmol/L (ref 135–145)

## 2017-03-01 LAB — GLUCOSE, CAPILLARY
Glucose-Capillary: 263 mg/dL — ABNORMAL HIGH (ref 65–99)
Glucose-Capillary: 170 mg/dL — ABNORMAL HIGH (ref 65–99)
Glucose-Capillary: 183 mg/dL — ABNORMAL HIGH (ref 65–99)
Glucose-Capillary: 135 mg/dL — ABNORMAL HIGH (ref 65–99)

## 2017-03-01 LAB — COMPREHENSIVE METABOLIC PANEL
ALT: 9 U/L — ABNORMAL LOW (ref 17–63)
AST: 16 U/L (ref 15–41)
Albumin: 1.7 g/dL — ABNORMAL LOW (ref 3.5–5.0)
Alkaline Phosphatase: 131 U/L — ABNORMAL HIGH (ref 38–126)
Anion gap: 7 (ref 5–15)
BUN: 20 mg/dL (ref 6–20)
CO2: 27 mmol/L (ref 22–32)
Calcium: 7.7 mg/dL — ABNORMAL LOW (ref 8.9–10.3)
Chloride: 101 mmol/L (ref 101–111)
Creatinine, Ser: 1.23 mg/dL (ref 0.61–1.24)
GFR calc Af Amer: >60 mL/min (ref >60)
GFR calc non Af Amer: 60 mL/min — ABNORMAL LOW (ref >60)
Glucose, Bld: 244 mg/dL — ABNORMAL HIGH (ref 65–99)
Potassium: 2.8 mmol/L — ABNORMAL LOW (ref 3.5–5.1)
Sodium: 135 mmol/L (ref 135–145)
Total Bilirubin: 1 mg/dL (ref 0.3–1.2)
Total Protein: 6 g/dL — ABNORMAL LOW (ref 6.5–8.1)

## 2017-03-01 LAB — PROCALCITONIN: Procalcitonin: 1.79 ng/mL

## 2017-03-01 LAB — MAGNESIUM: Magnesium: 2 mg/dL (ref 1.7–2.4)

## 2017-03-01 LAB — C-REACTIVE PROTEIN: CRP: 29.1 mg/dL — ABNORMAL HIGH (ref <1.0)

## 2017-03-01 LAB — LACTIC ACID, PLASMA: Lactic Acid, Venous: 1 mmol/L (ref 0.5–1.9)

## 2017-03-01 LAB — HEMOGLOBIN A1C
Hgb A1c MFr Bld: 9.5 % — ABNORMAL HIGH (ref 4.8–5.6)
Mean Plasma Glucose: 226 mg/dL

## 2017-03-01 SURGERY — AMPUTATION, FOOT, PARTIAL
Anesthesia: General | Site: Foot | Laterality: Right

## 2017-03-01 MED ORDER — FENTANYL CITRATE (PF) 100 MCG/2ML IJ SOLN
100.0000 ug | Freq: Once | INTRAMUSCULAR | Status: AC
  Administered 2017-03-01: 100 ug via INTRAVENOUS
  Filled 2017-03-01: qty 2

## 2017-03-01 MED ORDER — BUPIVACAINE HCL (PF) 0.5 % IJ SOLN
INTRAMUSCULAR | Status: DC | PRN
  Administered 2017-03-01: 10 mL via PERINEURAL

## 2017-03-01 MED ORDER — MIDAZOLAM HCL 2 MG/2ML IJ SOLN
INTRAMUSCULAR | Status: AC
  Filled 2017-03-01: qty 2

## 2017-03-01 MED ORDER — BUPIVACAINE-EPINEPHRINE (PF) 0.5% -1:200000 IJ SOLN
INTRAMUSCULAR | Status: DC | PRN
  Administered 2017-03-01: 30 mL via PERINEURAL

## 2017-03-01 MED ORDER — PROPOFOL 10 MG/ML IV BOLUS
INTRAVENOUS | Status: DC | PRN
  Administered 2017-03-01: 150 mg via INTRAVENOUS

## 2017-03-01 MED ORDER — SUCCINYLCHOLINE CHLORIDE 20 MG/ML IJ SOLN
INTRAMUSCULAR | Status: DC | PRN
  Administered 2017-03-01: 100 mg via INTRAVENOUS

## 2017-03-01 MED ORDER — METHOCARBAMOL 500 MG PO TABS: 500.0000 mg | ORAL_TABLET | Freq: Four times a day (QID) | ORAL | Status: DC | PRN

## 2017-03-01 MED ORDER — FENTANYL CITRATE (PF) 250 MCG/5ML IJ SOLN
INTRAMUSCULAR | Status: AC
  Filled 2017-03-01: qty 5

## 2017-03-01 MED ORDER — EPHEDRINE 5 MG/ML INJ
INTRAVENOUS | Status: AC
  Filled 2017-03-01: qty 10

## 2017-03-01 MED ORDER — PHENYLEPHRINE 40 MCG/ML (10ML) SYRINGE FOR IV PUSH (FOR BLOOD PRESSURE SUPPORT)
PREFILLED_SYRINGE | INTRAVENOUS | Status: AC
  Filled 2017-03-01: qty 20

## 2017-03-01 MED ORDER — SODIUM CHLORIDE 0.9 % IV SOLN
INTRAVENOUS | Status: DC
  Administered 2017-03-01: 16:00:00 via INTRAVENOUS

## 2017-03-01 MED ORDER — MIDAZOLAM HCL 2 MG/2ML IJ SOLN
2.0000 mg | Freq: Once | INTRAMUSCULAR | Status: AC
  Administered 2017-03-01: 2 mg via INTRAVENOUS
  Filled 2017-03-01: qty 2

## 2017-03-01 MED ORDER — MAGNESIUM CITRATE PO SOLN: 1.0000 | Freq: Once | ORAL | Status: DC | PRN

## 2017-03-01 MED ORDER — METOCLOPRAMIDE HCL 5 MG/ML IJ SOLN: 5.0000 mg | Freq: Three times a day (TID) | INTRAMUSCULAR | Status: DC | PRN

## 2017-03-01 MED ORDER — ONDANSETRON HCL 4 MG/2ML IJ SOLN
INTRAMUSCULAR | Status: DC | PRN
  Administered 2017-03-01: 4 mg via INTRAVENOUS

## 2017-03-01 MED ORDER — DEXTROSE 5 % IV SOLN
500.0000 mg | Freq: Four times a day (QID) | INTRAVENOUS | Status: DC | PRN
  Filled 2017-03-01: qty 5

## 2017-03-01 MED ORDER — MIDAZOLAM HCL 2 MG/2ML IJ SOLN
INTRAMUSCULAR | Status: AC
  Administered 2017-03-01: 2 mg via INTRAVENOUS
  Filled 2017-03-01: qty 2

## 2017-03-01 MED ORDER — FENTANYL CITRATE (PF) 100 MCG/2ML IJ SOLN
INTRAMUSCULAR | Status: AC
  Administered 2017-03-01: 100 ug via INTRAVENOUS
  Filled 2017-03-01: qty 2

## 2017-03-01 MED ORDER — LIDOCAINE HCL (CARDIAC) 20 MG/ML IV SOLN
INTRAVENOUS | Status: DC | PRN
  Administered 2017-03-01: 60 mg via INTRAVENOUS

## 2017-03-01 MED ORDER — PROPOFOL 10 MG/ML IV BOLUS
INTRAVENOUS | Status: AC
  Filled 2017-03-01: qty 20

## 2017-03-01 MED ORDER — POLYETHYLENE GLYCOL 3350 17 G PO PACK
17.0000 g | PACK | Freq: Every day | ORAL | Status: DC | PRN
Start: 2017-03-01 — End: 2017-03-03

## 2017-03-01 MED ORDER — PHENYLEPHRINE HCL 10 MG/ML IJ SOLN
INTRAMUSCULAR | Status: DC | PRN
  Administered 2017-03-01: 120 ug via INTRAVENOUS

## 2017-03-01 MED ORDER — ACETAMINOPHEN 325 MG PO TABS: 650.0000 mg | ORAL_TABLET | Freq: Four times a day (QID) | ORAL | Status: DC | PRN

## 2017-03-01 MED ORDER — BISACODYL 10 MG RE SUPP: 10.0000 mg | Freq: Every day | RECTAL | Status: DC | PRN

## 2017-03-01 MED ORDER — 0.9 % SODIUM CHLORIDE (POUR BTL) OPTIME
TOPICAL | Status: DC | PRN
  Administered 2017-03-01: 1000 mL

## 2017-03-01 MED ORDER — HYDRALAZINE HCL 20 MG/ML IJ SOLN: 10.0000 mg | INTRAMUSCULAR | Status: DC | PRN

## 2017-03-01 MED ORDER — POTASSIUM CHLORIDE 10 MEQ/100ML IV SOLN
10.0000 meq | INTRAVENOUS | Status: AC
  Administered 2017-03-01 (×5): 10 meq via INTRAVENOUS
  Filled 2017-03-01 (×5): qty 100

## 2017-03-01 MED ORDER — LACTATED RINGERS IV SOLN
INTRAVENOUS | Status: DC | PRN
  Administered 2017-03-01: 14:00:00 via INTRAVENOUS

## 2017-03-01 MED ORDER — METOCLOPRAMIDE HCL 5 MG PO TABS: 5.0000 mg | ORAL_TABLET | Freq: Three times a day (TID) | ORAL | Status: DC | PRN

## 2017-03-01 MED ORDER — LIDOCAINE 2% (20 MG/ML) 5 ML SYRINGE
INTRAMUSCULAR | Status: AC
  Filled 2017-03-01: qty 5

## 2017-03-01 MED ORDER — LIDOCAINE 2% (20 MG/ML) 5 ML SYRINGE
INTRAMUSCULAR | Status: AC
  Filled 2017-03-01: qty 20

## 2017-03-01 MED ORDER — HYDROMORPHONE HCL 1 MG/ML IJ SOLN: 1.0000 mg | INTRAMUSCULAR | Status: DC | PRN

## 2017-03-01 MED ORDER — OXYCODONE HCL 5 MG PO TABS: 5.0000 mg | ORAL_TABLET | ORAL | Status: DC | PRN

## 2017-03-01 MED ORDER — ONDANSETRON HCL 4 MG PO TABS: 4.0000 mg | ORAL_TABLET | Freq: Four times a day (QID) | ORAL | Status: DC | PRN

## 2017-03-01 MED ORDER — ONDANSETRON HCL 4 MG/2ML IJ SOLN: 4.0000 mg | Freq: Four times a day (QID) | INTRAMUSCULAR | Status: DC | PRN

## 2017-03-01 MED ORDER — DOCUSATE SODIUM 100 MG PO CAPS
100.0000 mg | ORAL_CAPSULE | Freq: Two times a day (BID) | ORAL | Status: DC
  Administered 2017-03-01 – 2017-03-02 (×3): 100 mg via ORAL
  Filled 2017-03-01 (×3): qty 1

## 2017-03-01 MED ORDER — ONDANSETRON HCL 4 MG/2ML IJ SOLN
INTRAMUSCULAR | Status: AC
  Filled 2017-03-01: qty 4

## 2017-03-01 MED ORDER — ONDANSETRON HCL 4 MG/2ML IJ SOLN
INTRAMUSCULAR | Status: AC
  Filled 2017-03-01: qty 2

## 2017-03-01 MED ORDER — ACETAMINOPHEN 650 MG RE SUPP: 650.0000 mg | Freq: Four times a day (QID) | RECTAL | Status: DC | PRN

## 2017-03-01 SURGICAL SUPPLY — 29 items
BLADE SAW SGTL HD 18.5X60.5X1. (BLADE) ×2 IMPLANT
BLADE SURG 21 STRL SS (BLADE) ×2 IMPLANT
BNDG COHESIVE 4X5 TAN STRL (GAUZE/BANDAGES/DRESSINGS) ×2 IMPLANT
BNDG GAUZE ELAST 4 BULKY (GAUZE/BANDAGES/DRESSINGS) ×4 IMPLANT
COVER SURGICAL LIGHT HANDLE (MISCELLANEOUS) ×2 IMPLANT
DRAPE INCISE IOBAN 66X45 STRL (DRAPES) ×4 IMPLANT
DRAPE U-SHAPE 47X51 STRL (DRAPES) ×2 IMPLANT
DRSG PAD ABDOMINAL 8X10 ST (GAUZE/BANDAGES/DRESSINGS) ×4 IMPLANT
DURAPREP 26ML APPLICATOR (WOUND CARE) ×2 IMPLANT
ELECT CAUTERY BLADE 6.4 (BLADE) ×2 IMPLANT
ELECT REM PT RETURN 9FT ADLT (ELECTROSURGICAL) ×2
ELECTRODE REM PT RTRN 9FT ADLT (ELECTROSURGICAL) ×1 IMPLANT
GAUZE SPONGE 4X4 12PLY STRL (GAUZE/BANDAGES/DRESSINGS) ×2 IMPLANT
GLOVE BIOGEL PI IND STRL 9 (GLOVE) ×1 IMPLANT
GLOVE BIOGEL PI INDICATOR 9 (GLOVE) ×1
GLOVE SURG ORTHO 9.0 STRL STRW (GLOVE) ×2 IMPLANT
GOWN STRL REUS W/ TWL XL LVL3 (GOWN DISPOSABLE) ×2 IMPLANT
GOWN STRL REUS W/TWL XL LVL3 (GOWN DISPOSABLE) ×2
KIT BASIN OR (CUSTOM PROCEDURE TRAY) ×2 IMPLANT
KIT ROOM TURNOVER OR (KITS) ×2 IMPLANT
NS IRRIG 1000ML POUR BTL (IV SOLUTION) ×2 IMPLANT
PACK ORTHO EXTREMITY (CUSTOM PROCEDURE TRAY) ×2 IMPLANT
PAD ARMBOARD 7.5X6 YLW CONV (MISCELLANEOUS) ×4 IMPLANT
SUT ETHILON 2 0 PSLX (SUTURE) ×2 IMPLANT
SUT VIC AB 2-0 CTB1 (SUTURE) ×2 IMPLANT
TOWEL OR 17X24 6PK STRL BLUE (TOWEL DISPOSABLE) ×2 IMPLANT
TOWEL OR 17X26 10 PK STRL BLUE (TOWEL DISPOSABLE) ×2 IMPLANT
TUBE CONNECTING 12X1/4 (SUCTIONS) ×2 IMPLANT
YANKAUER SUCT BULB TIP NO VENT (SUCTIONS) ×2 IMPLANT

## 2017-03-01 NOTE — Progress Notes (Signed)
Inpatient Diabetes Program Recommendations  AACE/ADA: New Consensus Statement on Inpatient Glycemic Control (2015)  Target Ranges:  Prepandial:   less than 140 mg/dL      Peak postprandial:   less than 180 mg/dL (1-2 hours)      Critically ill patients:  140 - 180 mg/dL   Lab Results  Component Value Date   GLUCAP 170 (H) 03/01/2017   HGBA1C 9.5 (H) 02/28/2017    Review of Glycemic Control Results for Jory SimsSTES, Keyvon (MRN [id number]) as of 03/01/2017 13:00  Ref. Range 02/28/2017 12:22 02/28/2017 17:01 02/28/2017 21:59 03/01/2017 06:21 03/01/2017 12:05  Glucose-Capillary Latest Ref Range: 65 - 99 mg/dL 409225 (H) 811256 (H) 914257 (H) 263 (H) 170 (H)   Inpatient Diabetes Program Recommendations:   While NPO, consider increasing Novolog to q 4 hours.  Thank you, Billy FischerJudy E. Nisha Dhami, RN, MSN, CDE  Diabetes Coordinator Inpatient Glycemic Control Team Team Pager [phone] (8am-5pm) 03/01/2017 1:01 PM

## 2017-03-01 NOTE — Progress Notes (Signed)
Off unit to OR; called pharmacy to send Ancef to OR.; Consent signed, understands consent. No signs of anxiety or distress. Send fourth bag of potassium to OR with patient.

## 2017-03-01 NOTE — Consult Note (Signed)
ORTHOPAEDIC CONSULTATION  REQUESTING PHYSICIAN: Kathlen Mody, MD  Chief Complaint: Gangrene ulceration right forefoot.  HPI: Samuel Roth is a 66 y.o. male who presents with gangrenous ulcer right forefoot. Patient denies any knowledge of how long he has had the ulceration.  Past Medical History:  Diagnosis Date  . Glaucoma   . Legally blind    Past Surgical History:  Procedure Laterality Date  . EYE SURGERY    . TIBIA FRACTURE SURGERY     Social History   Social History  . Marital status: Married    Spouse name: N/A  . Number of children: N/A  . Years of education: N/A   Social History Main Topics  . Smoking status: Former Games developer  . Smokeless tobacco: Former Neurosurgeon  . Alcohol use Yes     Comment: social  . Drug use: No  . Sexual activity: Not Asked   Other Topics Concern  . None   Social History Narrative  . None   History reviewed. No pertinent family history. - negative except otherwise stated in the family history section Allergies  Allergen Reactions  . Ciprofloxacin Hives   Prior to Admission medications   Medication Sig Start Date End Date Taking? Authorizing Provider  brimonidine (ALPHAGAN P) 0.1 % SOLN Place 1 drop into both eyes 3 (three) times daily. 12/15/16  Yes [provider]  brinzolamide (AZOPT) 1 % ophthalmic suspension Place 1 drop into the left eye 3 (three) times daily. 01/01/17  Yes [provider]  methazolamide (NEPTAZANE) 50 MG tablet Take 50 mg by mouth 3 (three) times daily. 02/02/17  Yes [provider]  pilocarpine (PILOCAR) 2 % ophthalmic solution Place 1 drop into the left eye 4 (four) times daily. 01/01/17  Yes [provider]  timolol (TIMOPTIC) 0.5 % ophthalmic solution Place 1 drop into the left eye 2 (two) times daily. 01/01/17  Yes [provider]  Travoprost, BAK Free, (TRAVATAN Z) 0.004 % SOLN ophthalmic solution Place 1 drop into the left eye daily. 12/15/16  Yes [provider]   Dg Chest 2 View  Result Date: 02/27/2017 CLINICAL DATA:  Acute onset of productive cough and hyperglycemia. Back aches and malaise. Anorexia. Low-grade fever. Initial encounter. EXAM: CHEST  2 VIEW COMPARISON:  None. FINDINGS: The lungs are well-aerated and clear. There is no evidence of focal opacification, pleural effusion or pneumothorax. The heart is normal in size; the mediastinal contour is within normal limits. No acute osseous abnormalities are seen. IMPRESSION: No acute cardiopulmonary process seen. Electronically Signed   By: Roanna Raider M.D.   On: 02/27/2017 18:30   US Renal  Result Date: 02/28/2017 CLINICAL DATA:  Acute onset of renal failure.  Initial encounter. EXAM: RENAL / URINARY TRACT ULTRASOUND COMPLETE COMPARISON:  CT of the abdomen and pelvis performed 08/15/2007 FINDINGS: Right Kidney: Length: 9.2 cm. Echogenicity within normal limits. No mass or hydronephrosis visualized. Left Kidney: Length: 11.0 cm. Echogenicity within normal limits. No mass or hydronephrosis visualized. Bladder: Appears normal for degree of bladder distention. IMPRESSION: Unremarkable renal ultrasound.  No evidence of hydronephrosis. Electronically Signed   By: Roanna Raider M.D.   On: 02/28/2017 01:52   Dg Foot 2 Views Right  Result Date: 02/28/2017 CLINICAL DATA:  Left foot open wound and cellulitis. EXAM: RIGHT FOOT - 2 VIEW COMPARISON:  None FINDINGS: Diffuse soft tissue swelling is noted. Gas is noted within the soft tissues of the foot, greatest along the first metatarsal and great toe. No  radiographic evidence of acute osteomyelitis noted. No evidence of acute fracture, subluxation or dislocation. A small to moderate calcaneal spur is present. IMPRESSION: Soft tissue gas and swelling suspicious for gas-forming/ necrotizing infection. No radiographic evidence of acute osteomyelitis. Electronically Signed   By: Harmon PierJeffrey  Hu M.D.   On: 02/28/2017 08:45   - pertinent xrays, CT, MRI  studies were reviewed and independently interpreted  Positive ROS: All other systems have been reviewed and were otherwise negative with the exception of those mentioned in the HPI and as above.  Physical Exam: General: Alert, no acute distress Psychiatric: Patient is competent for consent with normal mood and affect Lymphatic: No axillary or cervical lymphadenopathy Cardiovascular: No pedal edema Respiratory: No cyanosis, no use of accessory musculature GI: No organomegaly, abdomen is soft and non-tender  Skin: Dry gangrenous changes of the right forefoot.   Neurologic: Patient does not have protective sensation bilateral lower extremities.   MUSCULOSKELETAL:  Examination patient has a strong dorsalis pedis pulse. He has superficial blistering around the midfoot and ankle. He has dry gangrenous changes over the forefoot. There is good hair growth on the leg and has pitting edema in the right lower extremity.  Assessment: Gangrenous ulcer right foot  Plan: Plan: We will try foot salvage intervention with a midfoot amputation. Discussed with the patient this may not heal and he may require a transtibial amputation. Patient states he understands wishes to proceed at this time nothing by mouth at this time plan for surgery this afternoon.  Thank you for the consult and the opportunity to see [name]  Marcus Duda, MD [facility] [phone] 6:33 AM

## 2017-03-01 NOTE — Progress Notes (Signed)
Patient potassium level 2.8 this morning. Call placed to Dr.Kakrakandy.New orders received and carried out.

## 2017-03-01 NOTE — Anesthesia Procedure Notes (Addendum)
Anesthesia Regional Block: Popliteal block   Pre-Anesthetic Checklist: ,, timeout performed, Correct Patient, Correct Site, Correct Laterality, Correct Procedure, Correct Position, site marked, Risks and benefits discussed, pre-op evaluation,  At surgeon's request and post-op pain management  Laterality: Right  Prep: Maximum Sterile Barrier Precautions used, chloraprep       Needles:  Injection technique: Single-shot  Needle Type: Echogenic Stimulator Needle     Needle Length: 9cm  Needle Gauge: 21     Additional Needles:   Procedures: ultrasound guided, nerve stimulator,,,,,,   Nerve Stimulator or Paresthesia:  Response: Peroneal,  Response: Tibial,   Additional Responses:   Narrative:  Start time: 03/01/2017 1:45 PM End time: 03/01/2017 2:00 PM Injection made incrementally with aspirations every 5 mL. Anesthesiologist: Gaynelle AduFITZGERALD, Neyda Durango  Additional Notes: 2% Lidocaine skin wheel. Saphenous block with 10cc of 0.5% Bupivicaine plain.

## 2017-03-01 NOTE — Transfer of Care (Signed)
Immediate Anesthesia Transfer of Care Note  Patient: Samuel SimsJohn Roth  Procedure(s) Performed: Procedure(s): RIGHT TRANS METATARSAL AMPUTATION (Right)  Patient Location: PACU  Anesthesia Type:GA combined with regional for post-op pain  Level of Consciousness: awake, alert  and patient cooperative  Airway & Oxygen Therapy: Patient Spontanous Breathing  Post-op Assessment: Report given to RN and Post -op Vital signs reviewed and stable  Post vital signs: Reviewed and stable  Last Vitals:  Vitals:   03/01/17 0500 03/01/17 1515  BP: (!) 167/76 (P) 122/70  Pulse: 80   Resp: 18 (!) (P) 22  Temp: 37.8 C (P) 36.9 C    Last Pain:  Vitals:   03/01/17 0800  TempSrc:   PainSc: 0-No pain         Complications: No apparent anesthesia complications

## 2017-03-01 NOTE — Clinical Social Work Note (Signed)
Clinical Social Work Assessment  Patient Details  Name: Samuel Roth MRN: [id number] Date of Birth: February 04, 1951  Date of referral:  03/01/17               Reason for consult:  Facility Placement                Permission sought to share information with:  Facility Art therapist granted to share information::  Yes, Verbal Permission Granted  Name::        Agency::  SNF  Relationship::     Contact Information:     Housing/Transportation Living arrangements for the past 2 months:  Single Family Home Source of Information:  Patient Patient Interpreter Needed:  None Criminal Activity/Legal Involvement Pertinent to Current Situation/Hospitalization:  No - Comment as needed Significant Relationships:  Spouse Lives with:  Spouse Do you feel safe going back to the place where you live?  No Need for family participation in patient care:  No (Coment)  Care giving concerns:  Patient is blind and resides with spouse.Patient indicated that he works full-time and was independent with most ADL's prior to hospitalization.  Patient indicates that he wants to return home however would be willing to go to SNF as he is aware that spouse may not be able to assist. Patient is not safe to DC home.  Social Worker assessment / plan:  CSW met with patient this day and discussed plan at DC to receive rehabilitation per clinical team. Patient aware and ambivalent about going to SNF vs. going home.  CSW explained role of CSW and SNF plan and options. Patient gave permission to send offers to SNF's in local area.  Passr completed. FL2 pending surgery. No offers sent.  Employment status:  [employer] information:  Medicare PT Recommendations:  [place] / Referral to community resources:  [place]  Patient/Family's Response to care:  Patient please with CSW assistance however might want to go home. No issues or concerns identified at this  time.  Patient/Family's Understanding of and Emotional Response to Diagnosis, Current Treatment, and Prognosis:  Patient has good understanding of diagnosis, current treatment, and prognosis and is hopeful that at DC he will be medically stable to got to SNF or home. No issues or concerns reported at this time.  Emotional Assessment Appearance:  Appears stated age Attitude/Demeanor/Rapport:   (Cooperative) Affect (typically observed):  Accepting, Appropriate Orientation:  Oriented to Self, Oriented to Place, Oriented to  Time, Oriented to Situation Alcohol / Substance use:  Not Applicable Psych involvement (Current and /or in the community):  No (Comment)  Discharge Needs  Concerns to be addressed:  Care Coordination Readmission within the last 30 days:  No Current discharge risk:  Physical Impairment, Dependent with Mobility Barriers to Discharge:  No Barriers Identified   Normajean Baxter, LCSW 03/01/2017, 11:16 AM

## 2017-03-01 NOTE — Anesthesia Preprocedure Evaluation (Addendum)
Anesthesia Evaluation  Patient identified by MRN, date of birth, ID band Patient awake    Reviewed: Allergy & Precautions, H&P , NPO status , Patient's Chart, lab work & pertinent test results  Airway Mallampati: II  TM Distance: >3 FB Neck ROM: Full    Dental no notable dental hx. (+) Poor Dentition, Dental Advisory Given   Pulmonary neg pulmonary ROS, former smoker,    Pulmonary exam normal breath sounds clear to auscultation       Cardiovascular negative cardio ROS   Rhythm:Regular Rate:Normal     Neuro/Psych negative neurological ROS  negative psych ROS   GI/Hepatic negative GI ROS, Neg liver ROS,   Endo/Other  diabetes, Insulin Dependent  Renal/GU negative Renal ROS  negative genitourinary   Musculoskeletal   Abdominal   Peds  Hematology negative hematology ROS (+)   Anesthesia Other Findings   Reproductive/Obstetrics negative OB ROS                            Anesthesia Physical Anesthesia Plan  ASA: III  Anesthesia Plan: General   Post-op Pain Management:  Regional for Post-op pain   Induction: Intravenous  Airway Management Planned: Oral ETT  Additional Equipment:   Intra-op Plan:   Post-operative Plan: Extubation in OR  Informed Consent: I have reviewed the patients History and Physical, chart, labs and discussed the procedure including the risks, benefits and alternatives for the proposed anesthesia with the patient or authorized representative who has indicated his/her understanding and acceptance.   Dental advisory given  Plan Discussed with: CRNA  Anesthesia Plan Comments:        Anesthesia Quick Evaluation

## 2017-03-01 NOTE — Progress Notes (Addendum)
Patient ID: Samuel Roth, male   DOB: 21-May-1951, 66 y.o.   MRN: [id number]  PROGRESS NOTE    Desmund Elman  WUJ:[id number] DOB: August 01, 1951 DOA: 02/27/2017 PCP: Patient, No Pcp Per   Brief Narrative:  66 y.o. male with medical history significant of glaucoma who is legally blind presented with complaints of flulike symptoms for the last 5-6 days with fever, chills cough and decreased urine output. He was diagnosed with flu and hyperglycemia at an urgent care facility. He was admitted with leukocytosis, acute kidney injury and influenza bronchitis. He was also found to have severe right foot cellulitis with probable osteomyelitis and almost necrotic toes. He was started on broad-spectrum antibiotics. I spoke to Dr. Eulah Pont from orthopedics today and he recommends the patient to be transferred to [facility] for probable amputation  by Dr. Lajoyce Corners. Patient was transferred to  [facility].  Assessment & Plan:   Principal Problem:   Acute kidney injury [facility]) Active Problems:   Hyperglycemia   Leukocytosis   Acute osteomyelitis (HCC)   Gangrene of right foot (HCC)   Diabetes mellitus, new onset (HCC)   Acute renal failure (HCC)  1. Right foot severe cellulitis with probable acute osteomyelitis with open wounds and almost necrotic appearing fourth and fifth toes: - Continue vancomycin and zosyn.  Transferred to [facility] for mid foot amputation.  - pain control.    2. Dyspnea: Probably due to influenza bronchitis - Oxygen supplementation if needed  3. Probable influenza bronchitis:  - Supportive treatment. Tamiflu for 5 days. Repeat chest x-ray does not show any acute cardio pulm disease.  - If respiratory status does not improve, we will need to get CT scan of the chest  4. Acute kidney injury: - Probably due to dehydration. Improving -  Continue IV fluids normal saline with intravenous potassium . renal ultrasound was negative for hydronephrosis. Repeat creatinine in a.m stable around  1.2.   4. Hyperglycemia: Rule out newly diagnosed diabetes  HbA1c is 9.5 CBG (last 3)   Recent Labs  02/28/17 2159 03/01/17 0621 03/01/17 1205  GLUCAP 257* 263* 170*    Started him on SSI.  Will need small dose of long acting insulin.   5. History of glaucoma and legally blind: Continue with home eyedrops. Hold oral medication  6. Hyponatremia and hypokalemia: Probably due to dehydration.  Continue IV fluids with intravenous potassium. Replete potassium.   7. Leukocytosis: Probably secondary to #1. Continue antibiotics. Improving leukocytosis.  Follow cultures.  8. Hypertension: will add on prn hydralazine, in addition to his home meds.      DVT prophylaxis:  heparin  Code Status:   full  Family Communication:  none present at bedside  Disposition Plan:pending further eval, possibly PT eval after the surgery.   Consultants: Orthopedics  Procedures: None   Antimicrobials: Vancomycin and Zosyn from 02/28/2017   Subjective: Patient seen and examined at bedside. No new complaints.  lookig forward to the surgery.  Objective: Vitals:   02/28/17 0620 02/28/17 1300 02/28/17 2100 03/01/17 0500  BP: (!) 136/54 (!) 160/70 (!) 142/68 (!) 167/76  Pulse: 96 90 96 80  Resp: 16 20 16 18   Temp: 100.1 F (37.8 C) 98.2 F (36.8 C) 99.6 F (37.6 C) 100 F (37.8 C)  TempSrc: Oral Oral Oral Oral  SpO2: 96% 100% 98% 98%  Weight:      Height:        Intake/Output Summary (Last 24 hours) at 03/01/17 1228 Last data filed at  03/01/17 0455  Gross per 24 hour  Intake          1868.33 ml  Output             1100 ml  Net           768.33 ml   Filed Weights   02/27/17 2000  Weight: 76.7 kg (169 lb 1.5 oz)    Examination:  General exam: Appears calm and comfortable  Respiratory system:  Good air entry, no wheezing heard.  Cardiovascular system: S1 & S2 heard, rate controlled Gastrointestinal system: Abdomen is nondistended, soft and nontender. Normal bowel sounds  heard. Central nervous system: Alert and oriented. No focal neurological deficits. Moving extremities Extremities: No cyanosis, clubbing, trace pedal edema Skin:  right foot has a dressing. Fourth and fifth toes look cross cyanotic/almost necrotic. There is diffuse erythema of the toes, foot extending up to mid shin along with open wounds on the right foot dorsally with some foul odor to the foot. Draining sanguinous fluid.     Data Reviewed: I have personally reviewed following labs and imaging studies  CBC:  Recent Labs Lab 02/27/17 1644 02/28/17 0436 03/01/17 0443  WBC 29.4* 27.1* 23.5*  NEUTROABS 26.2* 23.9* 20.2*  HGB 13.2 10.3* 11.7*  HCT 36.8* 28.9* 33.4*  MCV 86.6 86.3 86.1  PLT 207 218 242   Basic Metabolic Panel:  Recent Labs Lab 02/27/17 1644 02/28/17 0436 03/01/17 0443  NA 131* 132* 135  K 3.4* 2.9* 2.8*  CL 94* 98* 101  CO2 23 21* 27  GLUCOSE 327* 266* 244*  BUN 41* 26* 20  CREATININE 2.10* 1.24 1.23  CALCIUM 8.5* 7.6* 7.7*  MG  --   --  2.0   GFR: Estimated Creatinine Clearance: 59.9 mL/min (by C-G formula based on SCr of 1.23 mg/dL). Liver Function Tests:  Recent Labs Lab 02/27/17 1644 02/28/17 0436 03/01/17 0443  AST 20 15 16   ALT 13* 10* 9*  ALKPHOS 155* 124 131*  BILITOT 1.2 1.2 1.0  PROT 7.9 6.0* 6.0*  ALBUMIN 3.0* 2.1* 1.7*   No results for input(s): LIPASE, AMYLASE in the last 168 hours. No results for input(s): AMMONIA in the last 168 hours. Coagulation Profile: No results for input(s): INR, PROTIME in the last 168 hours. Cardiac Enzymes: No results for input(s): CKTOTAL, CKMB, CKMBINDEX, TROPONINI in the last 168 hours. BNP (last 3 results) No results for input(s): PROBNP in the last 8760 hours. HbA1C:  Recent Labs  02/28/17 0436  HGBA1C 9.5*   CBG:  Recent Labs Lab 02/28/17 1222 02/28/17 1701 02/28/17 2159 03/01/17 0621 03/01/17 1205  GLUCAP 225* 256* 257* 263* 170*   Lipid Profile: No results for input(s):  CHOL, HDL, LDLCALC, TRIG, CHOLHDL, LDLDIRECT in the last 72 hours. Thyroid Function Tests: No results for input(s): TSH, T4TOTAL, FREET4, T3FREE, THYROIDAB in the last 72 hours. Anemia Panel: No results for input(s): VITAMINB12, FOLATE, FERRITIN, TIBC, IRON, RETICCTPCT in the last 72 hours. Sepsis Labs:  Recent Labs Lab 02/27/17 1644 02/28/17 0436 02/28/17 0820 03/01/17 0443  PROCALCITON 4.43  --   --  1.79  LATICACIDVEN  --  1.1 2.4*  --     Recent Results (from the past 240 hour(s))  Blood culture (routine x 2)     Status: None (Preliminary result)   Collection Time: 02/27/17  4:40 PM  Result Value Ref Range Status   Specimen Description BLOOD RIGHT ANTECUBITAL  Final   Special Requests   Final  BOTTLES DRAWN AEROBIC AND ANAEROBIC Blood Culture adequate volume   Culture   Final    NO GROWTH < 24 HOURS Performed at [facility], [address] [place], [place] [address]    Report Status PENDING  Incomplete  Blood culture (routine x 2)     Status: None (Preliminary result)   Collection Time: 02/27/17  4:41 PM  Result Value Ref Range Status   Specimen Description BLOOD LEFT ANTECUBITAL  Final   Special Requests   Final    BOTTLES DRAWN AEROBIC AND ANAEROBIC Blood Culture adequate volume   Culture   Final    NO GROWTH < 24 HOURS Performed at [facility], [address], [place], [place] [address]    Report Status PENDING  Incomplete  Surgical pcr screen     Status: None   Collection Time: 02/28/17  8:11 PM  Result Value Ref Range Status   MRSA, PCR NEGATIVE NEGATIVE Final   Staphylococcus aureus NEGATIVE NEGATIVE Final    Comment:        The Xpert SA Assay (FDA approved for NASAL specimens in patients over 6 years of age), is one component of a comprehensive surveillance program.  Test performance has been validated by [facility] for patients greater than or equal to 53 year old. It is not intended to diagnose infection nor to guide or monitor  treatment.          Radiology Studies: Dg Chest 2 View  Result Date: 02/27/2017 CLINICAL DATA:  Acute onset of productive cough and hyperglycemia. Back aches and malaise. Anorexia. Low-grade fever. Initial encounter. EXAM: CHEST  2 VIEW COMPARISON:  None. FINDINGS: The lungs are well-aerated and clear. There is no evidence of focal opacification, pleural effusion or pneumothorax. The heart is normal in size; the mediastinal contour is within normal limits. No acute osseous abnormalities are seen. IMPRESSION: No acute cardiopulmonary process seen. Electronically Signed   By: Roanna Raider M.D.   On: 02/27/2017 18:30   US Renal  Result Date: 02/28/2017 CLINICAL DATA:  Acute onset of renal failure.  Initial encounter. EXAM: RENAL / URINARY TRACT ULTRASOUND COMPLETE COMPARISON:  CT of the abdomen and pelvis performed 08/15/2007 FINDINGS: Right Kidney: Length: 9.2 cm. Echogenicity within normal limits. No mass or hydronephrosis visualized. Left Kidney: Length: 11.0 cm. Echogenicity within normal limits. No mass or hydronephrosis visualized. Bladder: Appears normal for degree of bladder distention. IMPRESSION: Unremarkable renal ultrasound.  No evidence of hydronephrosis. Electronically Signed   By: Roanna Raider M.D.   On: 02/28/2017 01:52   Dg Foot 2 Views Right  Result Date: 02/28/2017 CLINICAL DATA:  Left foot open wound and cellulitis. EXAM: RIGHT FOOT - 2 VIEW COMPARISON:  None FINDINGS: Diffuse soft tissue swelling is noted. Gas is noted within the soft tissues of the foot, greatest along the first metatarsal and great toe. No radiographic evidence of acute osteomyelitis noted. No evidence of acute fracture, subluxation or dislocation. A small to moderate calcaneal spur is present. IMPRESSION: Soft tissue gas and swelling suspicious for gas-forming/ necrotizing infection. No radiographic evidence of acute osteomyelitis. Electronically Signed   By: Harmon Pier M.D.   On: 02/28/2017 08:45         Scheduled Meds: . brimonidine  1 drop Both Eyes TID  . brinzolamide  1 drop Left Eye TID  . heparin  5,000 Units Subcutaneous Q8H  . insulin aspart  0-15 Units Subcutaneous TID WC  . insulin aspart  0-5 Units  Subcutaneous QHS  . insulin glargine  15 Units Subcutaneous QHS  . latanoprost  1 drop Left Eye QHS  . oseltamivir  30 mg Oral BID  . pilocarpine  1 drop Left Eye QID  . sodium chloride flush  3 mL Intravenous Q12H  . timolol  1 drop Left Eye BID   Continuous Infusions: . 0.9 % NaCl with KCl 40 mEq / L 100 mL/hr (02/28/17 0919)  .  ceFAZolin (ANCEF) IV    . piperacillin-tazobactam (ZOSYN)  IV Stopped (03/01/17 0456)  . vancomycin Stopped (03/01/17 0404)     LOS: 1 day        Kayne Yuhas, MD [facility] Pager [phone]  If 7PM-7AM, please contact night-coverage [email or web address] Password [email or web address] 03/01/2017, 12:28 PM

## 2017-03-01 NOTE — Anesthesia Procedure Notes (Signed)
Procedure Name: Intubation Date/Time: 03/01/2017 2:49 PM Performed by: Salli Quarry Rolf Fells Pre-anesthesia Checklist: Patient identified, Emergency Drugs available, Suction available and Patient being monitored Patient Re-evaluated:Patient Re-evaluated prior to inductionOxygen Delivery Method: Circle System Utilized Preoxygenation: Pre-oxygenation with 100% oxygen Intubation Type: IV induction, Rapid sequence and Cricoid Pressure applied Laryngoscope Size: Mac and 3 Grade View: Grade II Tube type: Oral Tube size: 7.5 mm Number of attempts: 1 Airway Equipment and Method: Stylet Placement Confirmation: ETT inserted through vocal cords under direct vision,  positive ETCO2 and breath sounds checked- equal and bilateral Secured at: 22 cm Tube secured with: Tape Dental Injury: Teeth and Oropharynx as per pre-operative assessment

## 2017-03-01 NOTE — Progress Notes (Signed)
PT Cancellation Note  Patient Details Name: Samuel SimsJohn Roth MRN: [id number] DOB: July 11, 1951   Cancelled Treatment:    Reason Eval/Treat Not Completed: Other (comment)Planned transmetatarsal amputation this PM.   Sharen HeckHill, Lambros Cerro Elizabeth Tirth [place] PT [phone]  03/01/2017, 7:45 AM

## 2017-03-01 NOTE — Op Note (Signed)
     Date of Surgery: 03/01/2017  INDICATIONS: Mr. Samuel Roth is a 66 y.o.-year-old male who has diabetes insulin controlled who presented with necrotic gangrenous forefoot. He had a good dorsalis pedis pulse and cellulitis extended to the midfoot. Patient presents at this time for foot salvage intervention for a transmetatarsal amputation. Patient states that he does not want to consider a higher level amputation at this time.Marland Kitchen.  PREOPERATIVE DIAGNOSIS: Gangrene cellulitis abscess right forefoot.  POSTOPERATIVE DIAGNOSIS: Same.  PROCEDURE: Transmetatarsal amputation   SURGEON: Lajoyce Cornersuda, M.D.  ANESTHESIA:  general  IV FLUIDS AND URINE: See anesthesia.  ESTIMATED BLOOD LOSS: Minimal mL.  COMPLICATIONS: None.  DESCRIPTION OF PROCEDURE: The patient was brought to the operating room and underwent a general anesthetic. After adequate levels of anesthesia were obtained patient's lower extremity was prepped using DuraPrep draped into a sterile field. A timeout was called. The abscess and forefoot was draped out of the sterile field with an impervious Ioban.  A fishmouth incision was made just proximal to the ulcerative nonviable tissue. This was carried sharply down to bone. A oscillating saw was used to perform a transmetatarsal amputation with a gentle cascade of the metatarsals and beveled plantarly. Patient had abscess that extended proximal to the incision. Longitudinal incisions were made to decompress the abscess. The wound was irrigated with normal saline necrotic tendons were excised. The abscess extended proximal to the ankle. Electrocautery was used for hemostasis. The wound was irrigated with normal saline. The incision was not closed. Closed. Patient was taken to the PACU in stable condition. Patient will need to return to the operating room for transtibial amputation.  [name], MD [facility] 3:09 PM

## 2017-03-01 NOTE — Anesthesia Postprocedure Evaluation (Signed)
Anesthesia Post Note  Patient: Samuel SimsJohn Roth  Procedure(s) Performed: Procedure(s) (LRB): RIGHT TRANS METATARSAL AMPUTATION (Right)  Patient location during evaluation: PACU Anesthesia Type: General and Regional Level of consciousness: awake and alert Pain management: pain level controlled Vital Signs Assessment: post-procedure vital signs reviewed and stable Respiratory status: spontaneous breathing, nonlabored ventilation and respiratory function stable Cardiovascular status: blood pressure returned to baseline and stable Postop Assessment: no signs of nausea or vomiting Anesthetic complications: no       Last Vitals:  Vitals:   03/01/17 1520 03/01/17 1535  BP: 124/68 128/65  Pulse: 74 69  Resp: (!) 23   Temp:  37.2 C    Last Pain:  Vitals:   03/01/17 1535  TempSrc: Oral  PainSc:                  [name],W. EDMOND

## 2017-03-01 NOTE — Progress Notes (Signed)
Patient back on unit. Resting comfortably, spouse at bedside. Continuing to monitor

## 2017-03-02 ENCOUNTER — Encounter (HOSPITAL_COMMUNITY): Payer: Self-pay | Admitting: Orthopedic Surgery

## 2017-03-02 LAB — CBC
HCT: 33.6 % — ABNORMAL LOW (ref 39.0–52.0)
Hemoglobin: 11.4 g/dL — ABNORMAL LOW (ref 13.0–17.0)
MCH: 29.6 pg (ref 26.0–34.0)
MCHC: 33.9 g/dL (ref 30.0–36.0)
MCV: 87.3 fL (ref 78.0–100.0)
Platelets: 244 10*3/uL (ref 150–400)
RBC: 3.85 MIL/uL — ABNORMAL LOW (ref 4.22–5.81)
RDW: 12.9 % (ref 11.5–15.5)
WBC: 19.8 10*3/uL — ABNORMAL HIGH (ref 4.0–10.5)

## 2017-03-02 LAB — GLUCOSE, CAPILLARY
Glucose-Capillary: 126 mg/dL — ABNORMAL HIGH (ref 65–99)
Glucose-Capillary: 179 mg/dL — ABNORMAL HIGH (ref 65–99)
Glucose-Capillary: 154 mg/dL — ABNORMAL HIGH (ref 65–99)
Glucose-Capillary: 123 mg/dL — ABNORMAL HIGH (ref 65–99)

## 2017-03-02 LAB — BASIC METABOLIC PANEL
Anion gap: 8 (ref 5–15)
BUN: 17 mg/dL (ref 6–20)
CO2: 26 mmol/L (ref 22–32)
Calcium: 7.6 mg/dL — ABNORMAL LOW (ref 8.9–10.3)
Chloride: 102 mmol/L (ref 101–111)
Creatinine, Ser: 1.12 mg/dL (ref 0.61–1.24)
GFR calc Af Amer: >60 mL/min (ref >60)
GFR calc non Af Amer: >60 mL/min (ref >60)
Glucose, Bld: 126 mg/dL — ABNORMAL HIGH (ref 65–99)
Potassium: 3.1 mmol/L — ABNORMAL LOW (ref 3.5–5.1)
Sodium: 136 mmol/L (ref 135–145)

## 2017-03-02 MED ORDER — POVIDONE-IODINE 10 % EX SWAB
2.0000 "application " | Freq: Once | CUTANEOUS | Status: AC
  Administered 2017-03-03: 2 via TOPICAL

## 2017-03-02 MED ORDER — POTASSIUM CHLORIDE CRYS ER 20 MEQ PO TBCR
40.0000 meq | EXTENDED_RELEASE_TABLET | Freq: Two times a day (BID) | ORAL | Status: AC
Start: 2017-03-02 — End: 2017-03-03
  Administered 2017-03-02: 40 meq via ORAL
  Filled 2017-03-02: qty 2

## 2017-03-02 MED ORDER — INSULIN DETEMIR 100 UNIT/ML ~~LOC~~ SOLN
5.0000 [IU] | Freq: Every day | SUBCUTANEOUS | Status: DC
Start: 2017-03-02 — End: 2017-03-02

## 2017-03-02 MED ORDER — CHLORHEXIDINE GLUCONATE 4 % EX LIQD
60.0000 mL | Freq: Once | CUTANEOUS | Status: AC
  Administered 2017-03-03: 4 via TOPICAL

## 2017-03-02 NOTE — Progress Notes (Signed)
Patient ID: Samuel Roth, male   DOB: 08/19/1951, 66 y.o.   MRN: [id number]  PROGRESS NOTE    Samuel Roth  WUJ:[id number] DOB: 06-12-1951 DOA: 02/27/2017 PCP: Patient, No Pcp Per   Brief Narrative:  66 y.o. male with medical history significant of glaucoma who is legally blind presented with complaints of flulike symptoms for the last 5-6 days with fever, chills cough and decreased urine output. He was diagnosed with flu and hyperglycemia at an urgent care facility. He was admitted with leukocytosis, acute kidney injury and influenza bronchitis. He was also found to have severe right foot cellulitis with probable osteomyelitis and almost necrotic toes. He was started on broad-spectrum antibiotics. I spoke to Dr. Eulah Pont from orthopedics today and he recommends the patient to be transferred to [facility] for probable amputation  by Dr. Lajoyce Corners. Patient was transferred to  [facility].  Assessment & Plan:   Principal Problem:   Acute kidney injury [facility]) Active Problems:   Hyperglycemia   Leukocytosis   Acute osteomyelitis (HCC)   Gangrene of right foot (HCC)   Diabetes mellitus, new onset (HCC)   Acute renal failure (HCC)  1. Right foot severe cellulitis with probable acute osteomyelitis with open wounds and almost necrotic appearing fourth and fifth toes: - Continue vancomycin and zosyn.  Transferred to [facility] for mid foot amputation. He underwent 1 transmetatarsal amputation right foot on 5/24 and plan for transtibial amp on 5/27 as there is extensive necrotic tissue.  - pain control.    2. Dyspnea: Probably due to influenza bronchitis - Oxygen supplementation if needed  3. Probable influenza bronchitis:  - Supportive treatment. Tamiflu for 5 days. Repeat chest x-ray does not show any acute cardio pulm disease.  - If respiratory status does not improve, we will need to get CT scan of the chest  4. Acute kidney injury: - Probably due to dehydration. Improving -  Continue IV  fluids normal saline with intravenous potassium . renal ultrasound was negative for hydronephrosis. Repeat creatinine in a.m stable around 1.2.   4. Hyperglycemia: Rule out newly diagnosed diabetes  HbA1c is 9.5 CBG (last 3)   Recent Labs  03/02/17 0557 03/02/17 1153 03/02/17 1639  GLUCAP 126* 179* 154*    Started him on SSI.  Will need small dose of long acting insulin.  Added 5 units of levemir.   5. History of glaucoma and legally blind: Continue with home eyedrops. Hold oral medication  6. Hyponatremia and hypokalemia: Probably due to dehydration.  Continue IV fluids with intravenous potassium. Replete potassium.   7. Leukocytosis: Probably secondary to #1. Continue antibiotics. Improving leukocytosis.  Follow cultures.  8. Hypertension: will add on prn hydralazine, in addition to his home meds.      DVT prophylaxis:  heparin  Code Status:   full  Family Communication:  none present at bedside  Disposition Plan:pending further eval, possibly PT eval after the surgery.   Consultants: Orthopedics  Procedures: None   Antimicrobials: Vancomycin and Zosyn from 02/28/2017   Subjective: Patient seen and examined at bedside. No new complaints.  lookig forward to the surgery.  Fever overnight, currently afebrile.  Objective: Vitals:   03/01/17 1535 03/01/17 2028 03/02/17 0427 03/02/17 1500  BP: 128/65 135/64 135/70 (!) 153/78  Pulse: 69 80 80 75  Resp:  20 20 18   Temp: 98.9 F (37.2 C) (!) 101.5 F (38.6 C) 99.1 F (37.3 C) 98.5 F (36.9 C)  TempSrc: Oral Oral Oral Oral  SpO2:  96% 97% 96% 98%  Weight:      Height:        Intake/Output Summary (Last 24 hours) at 03/02/17 1742 Last data filed at 03/02/17 1500  Gross per 24 hour  Intake              470 ml  Output             1000 ml  Net             -530 ml   Filed Weights   02/27/17 2000  Weight: 76.7 kg (169 lb 1.5 oz)    Examination:  General exam: Appears calm and comfortable  Respiratory  system:  Good air entry, no wheezing heard.  Cardiovascular system: S1 & S2 heard, rate controlled Gastrointestinal system: Abdomen is nondistended, soft and nontender. Normal bowel sounds heard. Central nervous system: Alert and oriented. No focal neurological deficits. Moving extremities Extremities: No cyanosis, clubbing, trace pedal edema Skin: right mid foot amputation bandaged.      Data Reviewed: I have personally reviewed following labs and imaging studies  CBC:  Recent Labs Lab 02/27/17 1644 02/28/17 0436 03/01/17 0443 03/01/17 1332 03/02/17 0423  WBC 29.4* 27.1* 23.5*  --  19.8*  NEUTROABS 26.2* 23.9* 20.2*  --   --   HGB 13.2 10.3* 11.7* 13.9 11.4*  HCT 36.8* 28.9* 33.4* 41.0 33.6*  MCV 86.6 86.3 86.1  --  87.3  PLT 207 218 242  --  244   Basic Metabolic Panel:  Recent Labs Lab 02/27/17 1644 02/28/17 0436 03/01/17 0443 03/01/17 1332 03/01/17 1646 03/02/17 0423  NA 131* 132* 135 138 137 136  K 3.4* 2.9* 2.8* 3.2* 3.3* 3.1*  CL 94* 98* 101  --  103 102  CO2 23 21* 27  --  26 26  GLUCOSE 327* 266* 244* 178* 176* 126*  BUN 41* 26* 20  --  19 17  CREATININE 2.10* 1.24 1.23  --  1.08 1.12  CALCIUM 8.5* 7.6* 7.7*  --  7.6* 7.6*  MG  --   --  2.0  --   --   --    GFR: Estimated Creatinine Clearance: 65.8 mL/min (by C-G formula based on SCr of 1.12 mg/dL). Liver Function Tests:  Recent Labs Lab 02/27/17 1644 02/28/17 0436 03/01/17 0443  AST 20 15 16   ALT 13* 10* 9*  ALKPHOS 155* 124 131*  BILITOT 1.2 1.2 1.0  PROT 7.9 6.0* 6.0*  ALBUMIN 3.0* 2.1* 1.7*   No results for input(s): LIPASE, AMYLASE in the last 168 hours. No results for input(s): AMMONIA in the last 168 hours. Coagulation Profile: No results for input(s): INR, PROTIME in the last 168 hours. Cardiac Enzymes: No results for input(s): CKTOTAL, CKMB, CKMBINDEX, TROPONINI in the last 168 hours. BNP (last 3 results) No results for input(s): PROBNP in the last 8760 hours. HbA1C:  Recent  Labs  02/28/17 0436  HGBA1C 9.5*   CBG:  Recent Labs Lab 03/01/17 1604 03/01/17 2113 03/02/17 0557 03/02/17 1153 03/02/17 1639  GLUCAP 183* 135* 126* 179* 154*   Lipid Profile: No results for input(s): CHOL, HDL, LDLCALC, TRIG, CHOLHDL, LDLDIRECT in the last 72 hours. Thyroid Function Tests: No results for input(s): TSH, T4TOTAL, FREET4, T3FREE, THYROIDAB in the last 72 hours. Anemia Panel: No results for input(s): VITAMINB12, FOLATE, FERRITIN, TIBC, IRON, RETICCTPCT in the last 72 hours. Sepsis Labs:  Recent Labs Lab 02/27/17 1644 02/28/17 0436 02/28/17 0820 03/01/17 0443 03/01/17 1646  PROCALCITON 4.43  --   --  1.79  --   LATICACIDVEN  --  1.1 2.4*  --  1.0    Recent Results (from the past 240 hour(s))  Blood culture (routine x 2)     Status: None (Preliminary result)   Collection Time: 02/27/17  4:40 PM  Result Value Ref Range Status   Specimen Description BLOOD RIGHT ANTECUBITAL  Final   Special Requests   Final    BOTTLES DRAWN AEROBIC AND ANAEROBIC Blood Culture adequate volume   Culture   Final    NO GROWTH 3 DAYS Performed at [facility], [address] [place], [place] [address]    Report Status PENDING  Incomplete  Blood culture (routine x 2)     Status: None (Preliminary result)   Collection Time: 02/27/17  4:41 PM  Result Value Ref Range Status   Specimen Description BLOOD LEFT ANTECUBITAL  Final   Special Requests   Final    BOTTLES DRAWN AEROBIC AND ANAEROBIC Blood Culture adequate volume   Culture   Final    NO GROWTH 3 DAYS Performed at [facility], [address] [place], [place] [address]    Report Status PENDING  Incomplete  Surgical pcr screen     Status: None   Collection Time: 02/28/17  8:11 PM  Result Value Ref Range Status   MRSA, PCR NEGATIVE NEGATIVE Final   Staphylococcus aureus NEGATIVE NEGATIVE Final    Comment:        The Xpert SA Assay (FDA approved for NASAL specimens in patients over 21 years of  age), is one component of a comprehensive surveillance program.  Test performance has been validated by [facility] for patients greater than or equal to 65 year old. It is not intended to diagnose infection nor to guide or monitor treatment.          Radiology Studies: No results found.      Scheduled Meds: . brimonidine  1 drop Both Eyes TID  . brinzolamide  1 drop Left Eye TID  . docusate sodium  100 mg Oral BID  . heparin  5,000 Units Subcutaneous Q8H  . insulin aspart  0-15 Units Subcutaneous TID WC  . insulin aspart  0-5 Units Subcutaneous QHS  . insulin glargine  15 Units Subcutaneous QHS  . latanoprost  1 drop Left Eye QHS  . oseltamivir  30 mg Oral BID  . pilocarpine  1 drop Left Eye QID  . sodium chloride flush  3 mL Intravenous Q12H  . timolol  1 drop Left Eye BID   Continuous Infusions: . sodium chloride 10 mL/hr at 03/01/17 1535  . 0.9 % NaCl with KCl 40 mEq / L 100 mL/hr (02/28/17 0919)  . methocarbamol (ROBAXIN)  IV    . piperacillin-tazobactam (ZOSYN)  IV 3.375 g (03/02/17 1634)  . vancomycin 1,000 mg (03/02/17 1446)     LOS: 2 days        Aarohi Redditt, MD [facility] Pager [phone]  If 7PM-7AM, please contact night-coverage [email or web address] Password [email or web address] 03/02/2017, 5:42 PM

## 2017-03-02 NOTE — Progress Notes (Signed)
Patient ID: Samuel SimsJohn Roth, male   DOB: 1951/03/07, 66 y.o.   MRN: [id number] Postoperative day 1 transmetatarsal amputation right foot. Patient had extensive necrotic tissue and abscess that extended proximal to the ankle. Plan for transtibial amputation tomorrow Sunday. Plan for discharge to skilled nursing.

## 2017-03-03 ENCOUNTER — Encounter (HOSPITAL_COMMUNITY)
Admission: EM | Disposition: A | Discharge status: Skilled Nursing Facility | Payer: Self-pay | Source: Home / Self Care | Attending: Emergency Medicine

## 2017-03-03 ENCOUNTER — Inpatient Hospital Stay (HOSPITAL_COMMUNITY): Payer: BC Managed Care – PPO | Admitting: Anesthesiology

## 2017-03-03 ENCOUNTER — Encounter (HOSPITAL_COMMUNITY): Payer: Self-pay | Admitting: Anesthesiology

## 2017-03-03 DIAGNOSIS — M86161 Other acute osteomyelitis, right tibia and fibula: Secondary | ICD-10-CM

## 2017-03-03 DIAGNOSIS — M8610 Other acute osteomyelitis, unspecified site: Secondary | ICD-10-CM

## 2017-03-03 DIAGNOSIS — E1042 Type 1 diabetes mellitus with diabetic polyneuropathy: Secondary | ICD-10-CM

## 2017-03-03 HISTORY — PX: AMPUTATION: SHX166

## 2017-03-03 LAB — GLUCOSE, CAPILLARY
Glucose-Capillary: 151 mg/dL — ABNORMAL HIGH (ref 65–99)
Glucose-Capillary: 112 mg/dL — ABNORMAL HIGH (ref 65–99)
Glucose-Capillary: 109 mg/dL — ABNORMAL HIGH (ref 65–99)
Glucose-Capillary: 110 mg/dL — ABNORMAL HIGH (ref 65–99)

## 2017-03-03 LAB — BASIC METABOLIC PANEL
Anion gap: 9 (ref 5–15)
BUN: 13 mg/dL (ref 6–20)
CO2: 27 mmol/L (ref 22–32)
Calcium: 7.4 mg/dL — ABNORMAL LOW (ref 8.9–10.3)
Chloride: 99 mmol/L — ABNORMAL LOW (ref 101–111)
Creatinine, Ser: 1.02 mg/dL (ref 0.61–1.24)
GFR calc Af Amer: >60 mL/min (ref >60)
GFR calc non Af Amer: >60 mL/min (ref >60)
Glucose, Bld: 128 mg/dL — ABNORMAL HIGH (ref 65–99)
Potassium: 3.3 mmol/L — ABNORMAL LOW (ref 3.5–5.1)
Sodium: 135 mmol/L (ref 135–145)

## 2017-03-03 LAB — PROCALCITONIN: Procalcitonin: 0.58 ng/mL

## 2017-03-03 SURGERY — AMPUTATION, FOOT, PARTIAL
Anesthesia: Regional | Site: Leg Lower | Laterality: Right

## 2017-03-03 MED ORDER — SODIUM CHLORIDE 0.9 % IV SOLN: INTRAVENOUS | Status: DC

## 2017-03-03 MED ORDER — ACETAMINOPHEN 325 MG PO TABS: 650.0000 mg | ORAL_TABLET | Freq: Four times a day (QID) | ORAL | Status: DC | PRN

## 2017-03-03 MED ORDER — METOCLOPRAMIDE HCL 5 MG PO TABS: 5.0000 mg | ORAL_TABLET | Freq: Three times a day (TID) | ORAL | Status: DC | PRN

## 2017-03-03 MED ORDER — METOCLOPRAMIDE HCL 5 MG/ML IJ SOLN: 5.0000 mg | Freq: Three times a day (TID) | INTRAMUSCULAR | Status: DC | PRN

## 2017-03-03 MED ORDER — PROPOFOL 10 MG/ML IV BOLUS
INTRAVENOUS | Status: DC | PRN
  Administered 2017-03-03: 120 mg via INTRAVENOUS

## 2017-03-03 MED ORDER — LACTATED RINGERS IV SOLN
Freq: Once | INTRAVENOUS | Status: AC
  Administered 2017-03-03: 08:00:00 via INTRAVENOUS

## 2017-03-03 MED ORDER — FENTANYL CITRATE (PF) 100 MCG/2ML IJ SOLN
INTRAMUSCULAR | Status: DC | PRN
  Administered 2017-03-03: 150 ug via INTRAVENOUS

## 2017-03-03 MED ORDER — MAGNESIUM CITRATE PO SOLN: 1.0000 | Freq: Once | ORAL | Status: DC | PRN

## 2017-03-03 MED ORDER — ONDANSETRON HCL 4 MG/2ML IJ SOLN: 4.0000 mg | Freq: Four times a day (QID) | INTRAMUSCULAR | Status: DC | PRN

## 2017-03-03 MED ORDER — MIDAZOLAM HCL 2 MG/2ML IJ SOLN
INTRAMUSCULAR | Status: AC
  Filled 2017-03-03: qty 2

## 2017-03-03 MED ORDER — PROMETHAZINE HCL 25 MG/ML IJ SOLN: 6.2500 mg | INTRAMUSCULAR | Status: DC | PRN

## 2017-03-03 MED ORDER — ALUM & MAG HYDROXIDE-SIMETH 200-200-20 MG/5ML PO SUSP
30.0000 mL | ORAL | Status: DC | PRN
  Administered 2017-03-03 – 2017-03-06 (×3): 30 mL via ORAL
  Filled 2017-03-03 (×3): qty 30

## 2017-03-03 MED ORDER — FENTANYL CITRATE (PF) 100 MCG/2ML IJ SOLN
INTRAMUSCULAR | Status: AC
  Administered 2017-03-03: 50 ug via INTRAVENOUS
  Filled 2017-03-03: qty 2

## 2017-03-03 MED ORDER — MIDAZOLAM HCL 2 MG/2ML IJ SOLN
1.0000 mg | Freq: Once | INTRAMUSCULAR | Status: AC
  Administered 2017-03-03: 1 mg via INTRAVENOUS

## 2017-03-03 MED ORDER — LIDOCAINE HCL (CARDIAC) 20 MG/ML IV SOLN
INTRAVENOUS | Status: DC | PRN
Start: 2017-03-03 — End: 2017-03-03
  Administered 2017-03-03: 50 mg via INTRAVENOUS

## 2017-03-03 MED ORDER — LIDOCAINE 2% (20 MG/ML) 5 ML SYRINGE
INTRAMUSCULAR | Status: AC
  Filled 2017-03-03: qty 5

## 2017-03-03 MED ORDER — FENTANYL CITRATE (PF) 250 MCG/5ML IJ SOLN
INTRAMUSCULAR | Status: AC
Start: 2017-03-03 — End: 2017-03-03
  Filled 2017-03-03: qty 5

## 2017-03-03 MED ORDER — BUPIVACAINE-EPINEPHRINE (PF) 0.5% -1:200000 IJ SOLN
INTRAMUSCULAR | Status: DC | PRN
  Administered 2017-03-03: 25 mL via PERINEURAL
  Administered 2017-03-03: 20 mL via PERINEURAL

## 2017-03-03 MED ORDER — HYDROMORPHONE HCL 1 MG/ML IJ SOLN: 1.0000 mg | INTRAMUSCULAR | Status: DC | PRN

## 2017-03-03 MED ORDER — FENTANYL CITRATE (PF) 100 MCG/2ML IJ SOLN
50.0000 ug | Freq: Once | INTRAMUSCULAR | Status: AC
  Administered 2017-03-03: 50 ug via INTRAVENOUS

## 2017-03-03 MED ORDER — DOCUSATE SODIUM 100 MG PO CAPS
100.0000 mg | ORAL_CAPSULE | Freq: Two times a day (BID) | ORAL | Status: DC
  Administered 2017-03-03 – 2017-03-06 (×6): 100 mg via ORAL
  Filled 2017-03-03 (×6): qty 1

## 2017-03-03 MED ORDER — LACTATED RINGERS IV SOLN
INTRAVENOUS | Status: DC | PRN
  Administered 2017-03-03: 09:00:00 via INTRAVENOUS

## 2017-03-03 MED ORDER — DEXTROSE 5 % IV SOLN
500.0000 mg | Freq: Four times a day (QID) | INTRAVENOUS | Status: DC | PRN
  Filled 2017-03-03: qty 5

## 2017-03-03 MED ORDER — BISACODYL 10 MG RE SUPP: 10.0000 mg | Freq: Every day | RECTAL | Status: DC | PRN

## 2017-03-03 MED ORDER — OXYCODONE HCL 5 MG PO TABS: 5.0000 mg | ORAL_TABLET | ORAL | Status: DC | PRN

## 2017-03-03 MED ORDER — ONDANSETRON HCL 4 MG PO TABS: 4.0000 mg | ORAL_TABLET | Freq: Four times a day (QID) | ORAL | Status: DC | PRN

## 2017-03-03 MED ORDER — ONDANSETRON HCL 4 MG/2ML IJ SOLN
INTRAMUSCULAR | Status: DC | PRN
  Administered 2017-03-03: 4 mg via INTRAVENOUS

## 2017-03-03 MED ORDER — ACETAMINOPHEN 650 MG RE SUPP: 650.0000 mg | Freq: Four times a day (QID) | RECTAL | Status: DC | PRN

## 2017-03-03 MED ORDER — LACTATED RINGERS IV SOLN: INTRAVENOUS | Status: DC | PRN

## 2017-03-03 MED ORDER — MIDAZOLAM HCL 2 MG/2ML IJ SOLN: 0.5000 mg | Freq: Once | INTRAMUSCULAR | Status: DC | PRN

## 2017-03-03 MED ORDER — ONDANSETRON HCL 4 MG/2ML IJ SOLN
INTRAMUSCULAR | Status: AC
  Filled 2017-03-03: qty 2

## 2017-03-03 MED ORDER — POLYETHYLENE GLYCOL 3350 17 G PO PACK
17.0000 g | PACK | Freq: Every day | ORAL | Status: DC | PRN
  Administered 2017-03-04: 17 g via ORAL
  Filled 2017-03-03: qty 1

## 2017-03-03 MED ORDER — METHOCARBAMOL 500 MG PO TABS: 500.0000 mg | ORAL_TABLET | Freq: Four times a day (QID) | ORAL | Status: DC | PRN

## 2017-03-03 MED ORDER — EPHEDRINE SULFATE 50 MG/ML IJ SOLN
INTRAMUSCULAR | Status: DC | PRN
  Administered 2017-03-03: 10 mg via INTRAVENOUS

## 2017-03-03 MED ORDER — MIDAZOLAM HCL 2 MG/2ML IJ SOLN
INTRAMUSCULAR | Status: AC
  Administered 2017-03-03: 1 mg via INTRAVENOUS
  Filled 2017-03-03: qty 2

## 2017-03-03 MED ORDER — HYDROMORPHONE HCL 1 MG/ML IJ SOLN: 0.2500 mg | INTRAMUSCULAR | Status: DC | PRN

## 2017-03-03 MED ORDER — 0.9 % SODIUM CHLORIDE (POUR BTL) OPTIME
TOPICAL | Status: DC | PRN
  Administered 2017-03-03: 1000 mL

## 2017-03-03 MED ORDER — MEPERIDINE HCL 25 MG/ML IJ SOLN: 6.2500 mg | INTRAMUSCULAR | Status: DC | PRN

## 2017-03-03 SURGICAL SUPPLY — 36 items
BENZOIN TINCTURE PRP APPL 2/3 (GAUZE/BANDAGES/DRESSINGS) IMPLANT
BLADE SAW SGTL HD 18.5X60.5X1. (BLADE) ×2 IMPLANT
BLADE SURG 21 STRL SS (BLADE) ×2 IMPLANT
BNDG COHESIVE 4X5 TAN STRL (GAUZE/BANDAGES/DRESSINGS) ×2 IMPLANT
BNDG GAUZE ELAST 4 BULKY (GAUZE/BANDAGES/DRESSINGS) IMPLANT
CANISTER WOUND CARE 500ML ATS (WOUND CARE) ×2 IMPLANT
COVER SURGICAL LIGHT HANDLE (MISCELLANEOUS) ×2 IMPLANT
DRAPE INCISE IOBAN 66X45 STRL (DRAPES) ×4 IMPLANT
DRAPE U-SHAPE 47X51 STRL (DRAPES) ×2 IMPLANT
DRSG ADAPTIC 3X8 NADH LF (GAUZE/BANDAGES/DRESSINGS) IMPLANT
DRSG PAD ABDOMINAL 8X10 ST (GAUZE/BANDAGES/DRESSINGS) IMPLANT
DRSG VAC ATS SM SENSATRAC (GAUZE/BANDAGES/DRESSINGS) ×2 IMPLANT
DURAPREP 26ML APPLICATOR (WOUND CARE) ×2 IMPLANT
ELECT REM PT RETURN 9FT ADLT (ELECTROSURGICAL) ×2
ELECTRODE REM PT RTRN 9FT ADLT (ELECTROSURGICAL) ×1 IMPLANT
GAUZE SPONGE 4X4 12PLY STRL (GAUZE/BANDAGES/DRESSINGS) IMPLANT
GLOVE BIOGEL PI IND STRL 9 (GLOVE) ×1 IMPLANT
GLOVE BIOGEL PI INDICATOR 9 (GLOVE) ×1
GLOVE SURG ORTHO 9.0 STRL STRW (GLOVE) ×4 IMPLANT
GOWN STRL REUS W/ TWL XL LVL3 (GOWN DISPOSABLE) ×3 IMPLANT
GOWN STRL REUS W/TWL XL LVL3 (GOWN DISPOSABLE) ×3
KIT BASIN OR (CUSTOM PROCEDURE TRAY) ×2 IMPLANT
KIT ROOM TURNOVER OR (KITS) ×2 IMPLANT
NS IRRIG 1000ML POUR BTL (IV SOLUTION) ×2 IMPLANT
PACK ORTHO EXTREMITY (CUSTOM PROCEDURE TRAY) ×2 IMPLANT
PAD ARMBOARD 7.5X6 YLW CONV (MISCELLANEOUS) ×4 IMPLANT
SPONGE LAP 18X18 X RAY DECT (DISPOSABLE) ×2 IMPLANT
SUT ETHILON 2 0 PSLX (SUTURE) ×4 IMPLANT
SUT SILK 2 0 (SUTURE) ×1
SUT SILK 2-0 18XBRD TIE 12 (SUTURE) ×1 IMPLANT
SUT VIC AB 2-0 CTB1 (SUTURE) ×4 IMPLANT
TOWEL OR 17X24 6PK STRL BLUE (TOWEL DISPOSABLE) ×2 IMPLANT
TOWEL OR 17X26 10 PK STRL BLUE (TOWEL DISPOSABLE) ×2 IMPLANT
TUBE CONNECTING 12X1/4 (SUCTIONS) ×2 IMPLANT
WATER STERILE IRR 1000ML POUR (IV SOLUTION) ×2 IMPLANT
YANKAUER SUCT BULB TIP NO VENT (SUCTIONS) ×2 IMPLANT

## 2017-03-03 NOTE — Progress Notes (Signed)
Patient ID: Samuel Roth, male   DOB: 10/18/1950, 66 y.o.   MRN: [id number]  PROGRESS NOTE    Samuel Roth  WUJ:[id number] DOB: August 17, 1951 DOA: 02/27/2017 PCP: Patient, No Pcp Per   Brief Narrative:  66 y.o. male with medical history significant of glaucoma who is legally blind presented with complaints of flulike symptoms for the last 5-6 days with fever, chills cough and decreased urine output. He was diagnosed with flu and hyperglycemia at an urgent care facility. He was admitted with leukocytosis, acute kidney injury and influenza bronchitis. He was also found to have severe right foot cellulitis with probable osteomyelitis and almost necrotic toes. He was started on broad-spectrum antibiotics. I spoke to Dr. Eulah Pont from orthopedics today and he recommends the patient to be transferred to [facility] for probable amputation  by Dr. Lajoyce Corners. Patient was transferred to  [facility].  Assessment & Plan:   1. Right foot severe cellulitis with probable acute osteomyelitis with open wounds and almost necrotic appearing fourth and fifth toes: - Continue vancomycin and zosyn.  - s/p Transtibial amputation today - Blood Cx negative so far -appreciate ortho consult -stop Abx in 1-2days -pain control    2. ? COPD/former smoker with Probable influenza bronchitis:  - Supportive treatment. Tamiflu for 5 days. Repeat chest x-ray does not show any acute cardio pulm disease.  - improving, wean off O2- now saturating 100% on 2L  4. Acute kidney injury: - Probably due to dehydration -resolved -stop IVF -renal US- no hydronephrosis   4. New DM -HbA1c is 9.5 -CBg stable on SSI and lantus -DM coordinator consult, difficult situation since Pt and wife are blind   5. History of glaucoma and legally blind: Continue with home eyedrops. Hold oral medication  6. Hyponatremia and hypokalemia:  - Continue IV fluids with intravenous potassium. Replete potassium.   7. Leukocytosis: Probably  secondary to #1. Continue antibiotics. Improving leukocytosis.   -CBC in am, afebrile  8. Hypertension -stable now, on home regimen  DVT prophylaxis:  heparin  Code Status:   full  Family Communication:  none present at bedside  Disposition Plan:pending further eval, possibly PT eval after the surgery, may need SNF Consultants: Orthopedics  Procedures: None   Antimicrobials: Vancomycin and Zosyn from 02/28/2017   Subjective: Just back from OR, some pain at site Objective: Vitals:   03/03/17 0920 03/03/17 0925 03/03/17 1028 03/03/17 1043  BP: (!) 149/72 (!) 144/68 138/78 (!) 141/76  Pulse:   84 81  Resp: 19 (!) 24 17 19   Temp:   97.3 F (36.3 C) 97.6 F (36.4 C)  TempSrc:      SpO2:   100% 100%  Weight:      Height:        Intake/Output Summary (Last 24 hours) at 03/03/17 1121 Last data filed at 03/03/17 1043  Gross per 24 hour  Intake             1533 ml  Output             1100 ml  Net              433 ml   Filed Weights   02/27/17 2000  Weight: 76.7 kg (169 lb 1.5 oz)    Examination:  General exam:AAOx3, comfortable Respiratory system:  CTAB, good air movement Cardiovascular system: S1 & S2 heard, rate controlled Gastrointestinal system: Abdomen is nondistended, soft and nontender. Normal bowel sounds heard. Central nervous system: Alert and oriented.  No focal neurological deficits. Moving extremities Extremities: No cyanosis, clubbing, amputation site LLE Skin: s/p TT amputation with dressing    Data Reviewed: I have personally reviewed following labs and imaging studies  CBC:  Recent Labs Lab 02/27/17 1644 02/28/17 0436 03/01/17 0443 03/01/17 1332 03/02/17 0423  WBC 29.4* 27.1* 23.5*  --  19.8*  NEUTROABS 26.2* 23.9* 20.2*  --   --   HGB 13.2 10.3* 11.7* 13.9 11.4*  HCT 36.8* 28.9* 33.4* 41.0 33.6*  MCV 86.6 86.3 86.1  --  87.3  PLT 207 218 242  --  244   Basic Metabolic Panel:  Recent Labs Lab 02/28/17 0436 03/01/17 0443  03/01/17 1332 03/01/17 1646 03/02/17 0423 03/03/17 0329  NA 132* 135 138 137 136 135  K 2.9* 2.8* 3.2* 3.3* 3.1* 3.3*  CL 98* 101  --  103 102 99*  CO2 21* 27  --  26 26 27   GLUCOSE 266* 244* 178* 176* 126* 128*  BUN 26* 20  --  19 17 13   CREATININE 1.24 1.23  --  1.08 1.12 1.02  CALCIUM 7.6* 7.7*  --  7.6* 7.6* 7.4*  MG  --  2.0  --   --   --   --    GFR: Estimated Creatinine Clearance: 72.2 mL/min (by C-G formula based on SCr of 1.02 mg/dL). Liver Function Tests:  Recent Labs Lab 02/27/17 1644 02/28/17 0436 03/01/17 0443  AST 20 15 16   ALT 13* 10* 9*  ALKPHOS 155* 124 131*  BILITOT 1.2 1.2 1.0  PROT 7.9 6.0* 6.0*  ALBUMIN 3.0* 2.1* 1.7*   No results for input(s): LIPASE, AMYLASE in the last 168 hours. No results for input(s): AMMONIA in the last 168 hours. Coagulation Profile: No results for input(s): INR, PROTIME in the last 168 hours. Cardiac Enzymes: No results for input(s): CKTOTAL, CKMB, CKMBINDEX, TROPONINI in the last 168 hours. BNP (last 3 results) No results for input(s): PROBNP in the last 8760 hours. HbA1C: No results for input(s): HGBA1C in the last 72 hours. CBG:  Recent Labs Lab 03/02/17 1153 03/02/17 1639 03/02/17 2239 03/03/17 0626 03/03/17 1028  GLUCAP 179* 154* 123* 151* 112*   Lipid Profile: No results for input(s): CHOL, HDL, LDLCALC, TRIG, CHOLHDL, LDLDIRECT in the last 72 hours. Thyroid Function Tests: No results for input(s): TSH, T4TOTAL, FREET4, T3FREE, THYROIDAB in the last 72 hours. Anemia Panel: No results for input(s): VITAMINB12, FOLATE, FERRITIN, TIBC, IRON, RETICCTPCT in the last 72 hours. Sepsis Labs:  Recent Labs Lab 02/27/17 1644 02/28/17 0436 02/28/17 0820 03/01/17 0443 03/01/17 1646 03/03/17 0329  PROCALCITON 4.43  --   --  1.79  --  0.58  LATICACIDVEN  --  1.1 2.4*  --  1.0  --     Recent Results (from the past 240 hour(s))  Blood culture (routine x 2)     Status: None (Preliminary result)   Collection  Time: 02/27/17  4:40 PM  Result Value Ref Range Status   Specimen Description BLOOD RIGHT ANTECUBITAL  Final   Special Requests   Final    BOTTLES DRAWN AEROBIC AND ANAEROBIC Blood Culture adequate volume   Culture   Final    NO GROWTH 3 DAYS Performed at [facility], [address] [place], [place] [address]    Report Status PENDING  Incomplete  Blood culture (routine x 2)     Status: None (Preliminary result)   Collection Time: 02/27/17  4:41 PM  Result Value Ref Range  Status   Specimen Description BLOOD LEFT ANTECUBITAL  Final   Special Requests   Final    BOTTLES DRAWN AEROBIC AND ANAEROBIC Blood Culture adequate volume   Culture   Final    NO GROWTH 3 DAYS Performed at [facility], [address] [place], [place] [address]    Report Status PENDING  Incomplete  Surgical pcr screen     Status: None   Collection Time: 02/28/17  8:11 PM  Result Value Ref Range Status   MRSA, PCR NEGATIVE NEGATIVE Final   Staphylococcus aureus NEGATIVE NEGATIVE Final    Comment:        The Xpert SA Assay (FDA approved for NASAL specimens in patients over 68 years of age), is one component of a comprehensive surveillance program.  Test performance has been validated by [facility] for patients greater than or equal to 65 year old. It is not intended to diagnose infection nor to guide or monitor treatment.          Radiology Studies: No results found.      Scheduled Meds: . brimonidine  1 drop Both Eyes TID  . brinzolamide  1 drop Left Eye TID  . docusate sodium  100 mg Oral BID  . heparin  5,000 Units Subcutaneous Q8H  . insulin aspart  0-15 Units Subcutaneous TID WC  . insulin aspart  0-5 Units Subcutaneous QHS  . insulin glargine  15 Units Subcutaneous QHS  . latanoprost  1 drop Left Eye QHS  . oseltamivir  30 mg Oral BID  . pilocarpine  1 drop Left Eye QID  . potassium chloride  40 mEq Oral BID  . sodium chloride flush  3 mL Intravenous Q12H  .  timolol  1 drop Left Eye BID   Continuous Infusions: . sodium chloride 10 mL/hr at 03/02/17 2341  . methocarbamol (ROBAXIN)  IV    . piperacillin-tazobactam (ZOSYN)  IV Stopped (03/03/17 0341)  . vancomycin Stopped (03/03/17 0640)     LOS: 3 days        Zannie Cove, MD [facility] Pager [phone]  If 7PM-7AM, please contact night-coverage [email or web address] Password [facility] 03/03/2017, 11:21 AM

## 2017-03-03 NOTE — H&P (View-Only) (Signed)
Patient ID: Samuel Roth, male   DOB: 01/10/1951, 66 y.o.   MRN: [id number] Postoperative day 1 transmetatarsal amputation right foot. Patient had extensive necrotic tissue and abscess that extended proximal to the ankle. Plan for transtibial amputation tomorrow Sunday. Plan for discharge to skilled nursing. 

## 2017-03-03 NOTE — Anesthesia Procedure Notes (Signed)
Procedure Name: LMA Insertion Date/Time: 03/03/2017 9:38 AM Performed by: Wray KearnsFOLEY, Eriel Doyon A Pre-anesthesia Checklist: Patient identified, Emergency Drugs available, Suction available and Patient being monitored Patient Re-evaluated:Patient Re-evaluated prior to inductionOxygen Delivery Method: Circle System Utilized Preoxygenation: Pre-oxygenation with 100% oxygen Intubation Type: IV induction Ventilation: Mask ventilation without difficulty LMA: LMA inserted LMA Size: 4.0 Number of attempts: 1 Placement Confirmation: positive ETCO2 Tube secured with: Tape Dental Injury: Teeth and Oropharynx as per pre-operative assessment

## 2017-03-03 NOTE — Anesthesia Postprocedure Evaluation (Signed)
Anesthesia Post Note  Patient: Jory SimsJohn Fludd  Procedure(s) Performed: Procedure(s) (LRB): RIGHT BKA (Right)  Patient location during evaluation: PACU Anesthesia Type: General and Regional Level of consciousness: awake and alert, oriented and patient cooperative Pain management: pain level controlled (no pain) Vital Signs Assessment: post-procedure vital signs reviewed and stable Respiratory status: spontaneous breathing, nonlabored ventilation and respiratory function stable Cardiovascular status: blood pressure returned to baseline and stable Postop Assessment: no signs of nausea or vomiting Anesthetic complications: no       Last Vitals:  Vitals:   03/03/17 1028 03/03/17 1043  BP: 138/78 (!) 141/76  Pulse: 84 81  Resp: 17 19  Temp: 36.3 C 36.4 C    Last Pain:  Vitals:   03/03/17 0900  TempSrc:   PainSc: 0-No pain                 Hans Rusher,E. Jerid Catherman

## 2017-03-03 NOTE — Transfer of Care (Signed)
Immediate Anesthesia Transfer of Care Note  Patient: Samuel SimsJohn Ohanesian  Procedure(s) Performed: Procedure(s): RIGHT BKA (Right)  Patient Location: PACU  Anesthesia Type:General  Level of Consciousness: awake, alert , oriented and patient cooperative  Airway & Oxygen Therapy: Patient Spontanous Breathing and Patient connected to nasal cannula oxygen  Post-op Assessment: Report given to RN and Post -op Vital signs reviewed and stable  Post vital signs: Reviewed and stable  Last Vitals:  Vitals:   03/03/17 0920 03/03/17 0925  BP: (!) 149/72 (!) 144/68  Pulse:    Resp: 19 (!) 24  Temp:      Last Pain:  Vitals:   03/03/17 0900  TempSrc:   PainSc: 0-No pain         Complications: No apparent anesthesia complications

## 2017-03-03 NOTE — Anesthesia Procedure Notes (Addendum)
Anesthesia Regional Block: Adductor canal block   Pre-Anesthetic Checklist: ,, timeout performed, Correct Patient, Correct Site, Correct Laterality, Correct Procedure, Correct Position, site marked, Risks and benefits discussed,  Surgical consent,  Pre-op evaluation,  At surgeon's request and post-op pain management  Laterality: Right and Lower  Prep: chloraprep       Needles:   Needle Type: Echogenic Needle     Needle Length: 9cm  Needle Gauge: 21     Additional Needles:   Procedures: ultrasound guided,,,,,,,,  Narrative:  Start time: 03/03/2017 9:09 AM End time: 03/03/2017 9:17 AM Injection made incrementally with aspirations every 5 mL.  Performed by: Personally  Anesthesiologist: Jean RosenthalJACKSON, Juliocesar Blasius  Additional Notes: Pt identified in Holding room.  Monitors applied. Working IV access confirmed. Sterile prep R thigh.  #21ga ECHOgenic needle into adductor canal with US guidance.  20cc 0.5% Bupivacaine with 1:200k epi injected incrementally after negative test dose.  Patient asymptomatic, VSS, no heme aspirated, tolerated well.  Sandford Craze Johnmatthew Solorio, MD

## 2017-03-03 NOTE — Op Note (Signed)
   Date of Surgery: 03/03/2017  INDICATIONS: Mr. Samuel Roth is a 66 y.o.-year-old male who has abscess ulceration right foot. Patient underwent foot salvage intervention however the infection extended up into the ankle and patient did not have foot salvage options.Marland Kitchen.  PREOPERATIVE DIAGNOSIS: Abscess ulceration osteomyelitis right foot  POSTOPERATIVE DIAGNOSIS: Same.  PROCEDURE: Transtibial amputation Application of Prevena wound VAC  SURGEON: Lajoyce Cornersuda, M.D.  ANESTHESIA:  general  IV FLUIDS AND URINE: See anesthesia.  ESTIMATED BLOOD LOSS: 100 mL.  COMPLICATIONS: None.  DESCRIPTION OF PROCEDURE: The patient was brought to the operating room and underwent a general anesthetic. After adequate levels of anesthesia were obtained patient's lower extremity was prepped using DuraPrep draped into a sterile field. A timeout was called. The foot was draped out of the sterile field with impervious stockinette. A transverse incision was made 11 cm distal to the tibial tubercle. This curved proximally and a large posterior flap was created. The tibia was transected 1 cm proximal to the skin incision. The fibula was transected just proximal to the tibial incision. The tibia was beveled anteriorly. A large posterior flap was created. The sciatic nerve was pulled cut and allowed to retract. The vascular bundles were suture ligated with 2-0 silk. The deep and superficial fascial layers were closed using #1 Vicryl. The skin was closed using staples and 2-0 nylon. The wound was covered with a Prevena wound VAC. There was a good suction fit. A prosthetic shrinker will be applied. Patient was extubated taken to the PACU in stable condition.  Samuel BakerMarcus Sandra Tellefsen, MD [facility] 10:23 AM

## 2017-03-03 NOTE — Anesthesia Procedure Notes (Signed)
Procedure Name: LMA Insertion Date/Time: 03/03/2017 9:39 AM Performed by: Wray KearnsFOLEY, Bernardo Brayman A Pre-anesthesia Checklist: Patient identified, Emergency Drugs available, Suction available and Patient being monitored Patient Re-evaluated:Patient Re-evaluated prior to inductionOxygen Delivery Method: Circle System Utilized and Circle system utilized Preoxygenation: Pre-oxygenation with 100% oxygen Intubation Type: IV induction Ventilation: Mask ventilation without difficulty LMA: LMA inserted LMA Size: 4.0 Tube type: Oral Number of attempts: 1 Placement Confirmation: positive ETCO2 Tube secured with: Tape Dental Injury: Teeth and Oropharynx as per pre-operative assessment

## 2017-03-03 NOTE — Interval H&P Note (Signed)
History and Physical Interval Note:  03/03/2017 7:30 AM  Samuel SimsJohn Roth  has presented today for surgery, with the diagnosis of OSTEOMYELITIS, RIGHT FOOT  The various methods of treatment have been discussed with the patient and family. After consideration of risks, benefits and other options for treatment, the patient has consented to  Procedure(s): RIGHT BKA (Right) as a surgical intervention .  The patient's history has been reviewed, patient examined, no change in status, stable for surgery.  I have reviewed the patient's chart and labs.  Questions were answered to the patient's satisfaction.     Nadara MustardMarcus V Delan Ksiazek

## 2017-03-03 NOTE — Addendum Note (Signed)
Addendum  created 03/03/17 1133 by Naliah Eddington, Pincus Sanesoger A, CRNA   Anesthesia Intra Flowsheets edited

## 2017-03-03 NOTE — Anesthesia Procedure Notes (Addendum)
Anesthesia Regional Block: Popliteal block   Pre-Anesthetic Checklist: ,, timeout performed, Correct Patient, Correct Site, Correct Laterality, Correct Procedure, Correct Position, site marked, Risks and benefits discussed,  Surgical consent,  Pre-op evaluation,  At surgeon's request and post-op pain management  Laterality: Right  Prep: chloraprep       Needles:  Injection technique: Single-shot  Needle Type: Echogenic Stimulator Needle     Needle Length: 9cm  Needle Gauge: 21     Additional Needles:   Procedures: ultrasound guided, nerve stimulator,,,,,,   Nerve Stimulator or Paresthesia:  Response: toe dorsiflexion, 0.6 mA, 0.1 ms,   Additional Responses:   Narrative:  Start time: 03/03/2017 8:54 AM End time: 03/03/2017 9:08 AM Injection made incrementally with aspirations every 5 mL.  Performed by: Personally  Anesthesiologist: Jean RosenthalJACKSON, Husam Hohn  Additional Notes: Pt identified in Holding room.  Monitors applied. Working IV access confirmed. Sterile prep R lateral knee.  #21ga ECHOgenic PNS to toe twitches at 0.976mA threshold, with US guidance.  30cc 0.5% Bupivacaine with 1:200k epi injected incrementally after negative test dose.  Patient asymptomatic, VSS, no heme aspirated, tolerated well.   Sandford Craze Lorraine Cimmino, MD

## 2017-03-03 NOTE — Progress Notes (Signed)
Pharmacy Antibiotic Note  Samuel SimsJohn Roth is a 66 y.o. male presented to the ED from Urgent Care  on 02/27/2017 with necrotic wound infection of foot.  Continues on broad spectrum abx for gangrene ulceration right forefoot, negative for osteo. 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 Afebrile today, WBC 19.8.  Plan: Continue vancomycin 1g IV Q12h Continue Zosyn 3.375gm IV Q8H, 4 hr infusion Monitor clinical picture, renal function, VT prn F/U s/p amputation for LOT  If all of infected tissue taken off could consider stopping abx soon ____________________________  Height: 5\' 9"  (175.3 cm) Weight: 169 lb 1.5 oz (76.7 kg) IBW/kg (Calculated) : 70.7  Temp (24hrs), Avg:98.1 F (36.7 C), Min:97.3 F (36.3 C), Max:98.5 F (36.9 C)   Recent Labs Lab 02/27/17 1644 02/28/17 0436 02/28/17 0820 03/01/17 0443 03/01/17 1646 03/02/17 0423 03/03/17 0329  WBC 29.4* 27.1*  --  23.5*  --  19.8*  --   CREATININE 2.10* 1.24  --  1.23 1.08 1.12 1.02  LATICACIDVEN  --  1.1 2.4*  --  1.0  --   --     Estimated Creatinine Clearance: 72.2 mL/min (by C-G formula based on SCr of 1.02 mg/dL).    Allergies  Allergen Reactions  . Ciprofloxacin Hives    Thank you for allowing pharmacy to be a part of this patient's care.  Enzo BiNathan Naleigha Raimondi, PharmD, BCPS Clinical Pharmacist Pager [phone] 03/03/2017 11:23 AM

## 2017-03-03 NOTE — Anesthesia Preprocedure Evaluation (Addendum)
Anesthesia Evaluation  Patient identified by MRN, date of birth, ID band Patient awake    Reviewed: Allergy & Precautions, NPO status , Patient's Chart, lab work & pertinent test results  History of Anesthesia Complications Negative for: history of anesthetic complications  Airway Mallampati: II  TM Distance: >3 FB Neck ROM: Full    Dental  (+) Poor Dentition, Loose, Dental Advisory Given, Missing, Chipped   Pulmonary neg pulmonary ROS, former smoker,  Tested flu positive   breath sounds clear to auscultation       Cardiovascular negative cardio ROS   Rhythm:Regular Rate:Normal     Neuro/Psych negative neurological ROS     GI/Hepatic Neg liver ROS, GERD  Controlled,  Endo/Other  diabetes (new onset DM, glu 151), Insulin Dependent  Renal/GU negative Renal ROS     Musculoskeletal   Abdominal   Peds  Hematology negative hematology ROS (+)   Anesthesia Other Findings   Reproductive/Obstetrics                            Anesthesia Physical Anesthesia Plan  ASA: III  Anesthesia Plan: General   Post-op Pain Management: GA combined w/ Regional for post-op pain   Induction: Intravenous  Airway Management Planned: LMA  Additional Equipment:   Intra-op Plan:   Post-operative Plan:   Informed Consent: I have reviewed the patients History and Physical, chart, labs and discussed the procedure including the risks, benefits and alternatives for the proposed anesthesia with the patient or authorized representative who has indicated his/her understanding and acceptance.   Dental advisory given  Plan Discussed with: CRNA and Surgeon  Anesthesia Plan Comments: (Plan routine monitors, GA- LMA OK, adductor and popliteal blocks for post op analgesia)        Anesthesia Quick Evaluation

## 2017-03-03 NOTE — Progress Notes (Signed)
Pt has been resting in bed since arrival back from PACU . NO c/o pain or discomfort. Wound vac intact to right BKA. No measurable output drainage in vac canister at this time.

## 2017-03-04 ENCOUNTER — Encounter (HOSPITAL_COMMUNITY): Payer: Self-pay | Admitting: Orthopedic Surgery

## 2017-03-04 DIAGNOSIS — E119 Type 2 diabetes mellitus without complications: Secondary | ICD-10-CM

## 2017-03-04 LAB — BASIC METABOLIC PANEL
Anion gap: 10 (ref 5–15)
BUN: 9 mg/dL (ref 6–20)
CO2: 28 mmol/L (ref 22–32)
Calcium: 7.6 mg/dL — ABNORMAL LOW (ref 8.9–10.3)
Chloride: 98 mmol/L — ABNORMAL LOW (ref 101–111)
Creatinine, Ser: 1.02 mg/dL (ref 0.61–1.24)
GFR calc Af Amer: >60 mL/min (ref >60)
GFR calc non Af Amer: >60 mL/min (ref >60)
Glucose, Bld: 131 mg/dL — ABNORMAL HIGH (ref 65–99)
Potassium: 3.4 mmol/L — ABNORMAL LOW (ref 3.5–5.1)
Sodium: 136 mmol/L (ref 135–145)

## 2017-03-04 LAB — CULTURE, BLOOD (ROUTINE X 2)
Culture: NO GROWTH
Culture: NO GROWTH
Special Requests: ADEQUATE
Special Requests: ADEQUATE

## 2017-03-04 LAB — CBC
HCT: 35.4 % — ABNORMAL LOW (ref 39.0–52.0)
Hemoglobin: 12.1 g/dL — ABNORMAL LOW (ref 13.0–17.0)
MCH: 30.5 pg (ref 26.0–34.0)
MCHC: 34.2 g/dL (ref 30.0–36.0)
MCV: 89.2 fL (ref 78.0–100.0)
Platelets: 281 10*3/uL (ref 150–400)
RBC: 3.97 MIL/uL — ABNORMAL LOW (ref 4.22–5.81)
RDW: 13.2 % (ref 11.5–15.5)
WBC: 11.3 10*3/uL — ABNORMAL HIGH (ref 4.0–10.5)

## 2017-03-04 LAB — GLUCOSE, CAPILLARY
Glucose-Capillary: 107 mg/dL — ABNORMAL HIGH (ref 65–99)
Glucose-Capillary: 135 mg/dL — ABNORMAL HIGH (ref 65–99)
Glucose-Capillary: 90 mg/dL (ref 65–99)

## 2017-03-04 MED ORDER — PANTOPRAZOLE SODIUM 40 MG PO TBEC
40.0000 mg | DELAYED_RELEASE_TABLET | Freq: Every day | ORAL | Status: DC
  Administered 2017-03-04: 40 mg via ORAL
  Filled 2017-03-04: qty 1

## 2017-03-04 MED ORDER — POTASSIUM CHLORIDE 20 MEQ PO PACK
40.0000 meq | PACK | Freq: Two times a day (BID) | ORAL | Status: AC
  Administered 2017-03-04 (×2): 40 meq via ORAL
  Filled 2017-03-04 (×2): qty 2

## 2017-03-04 MED ORDER — PANTOPRAZOLE SODIUM 40 MG IV SOLR
40.0000 mg | INTRAVENOUS | Status: DC
  Administered 2017-03-04 – 2017-03-05 (×2): 40 mg via INTRAVENOUS
  Filled 2017-03-04 (×2): qty 40

## 2017-03-04 NOTE — Progress Notes (Signed)
Physical Therapy Reevaluation and Treatment Patient Details Name: Samuel Roth MRN: [id number] DOB: 05-05-1951 Today's Date: 03/04/2017    History of Present Illness Samuel Roth is a 66 y.o. male with medical history significant of glaucoma who is legally blind presented with complaints of flu-like symptoms, cough with whitish expectoration associated with fever, chills, malaise, decreased appetite and decreased urine output, nausea and intermittent vomiting; xray R foot = Soft tissue gas and swelling suspicious for gas-forming/ necrotizing, s/p trans met amputation on 5/26 and BKA 5/27 PMH: Glaucoma, legally blind     PT Comments    Pt continues to make good progress towards his goals after his BKA surgery 03/03/17. Pt currently supervision for bed mobility, min guard for transfers and ambulation of 3 feet with RW. Pt requires skilled PT to progress his gait training and to improve his overall strength and endurance to mobilize his his discharge environment.     Follow Up Recommendations  SNF     Equipment Recommendations   (TBD )    Recommendations for Other Services       Precautions / Restrictions Precautions Precautions: Fall;Other (comment) Precaution Comments: legally blind Restrictions Weight Bearing Restrictions: Yes LLE Weight Bearing: Non weight bearing    Mobility  Bed Mobility Overal bed mobility: Needs Assistance Bed Mobility: Supine to Sit     Supine to sit: Supervision;HOB elevated     General bed mobility comments: for safety and line management  Transfers Overall transfer level: Needs assistance Equipment used: Rolling walker (2 wheeled) Transfers: Sit to/from Stand Sit to Stand: Min guard         General transfer comment: for safety and line management  Ambulation/Gait Ambulation/Gait assistance: Min guard Ambulation Distance (Feet): 3 Feet Assistive device: Rolling walker (2 wheeled) Gait Pattern/deviations:  (hop to pattern) Gait velocity:  slowed Gait velocity interpretation: Below normal speed for age/gender General Gait Details: Able to hop to chair using rolling walker while maintaining weight bearing status        Balance Overall balance assessment: Needs assistance Sitting-balance support: No upper extremity supported;Feet supported Sitting balance-Leahy Scale: Good     Standing balance support: Bilateral upper extremity supported Standing balance-Leahy Scale: Fair Standing balance comment: requires RW                            Cognition Arousal/Alertness: Awake/alert Behavior During Therapy: WFL for tasks assessed/performed Overall Cognitive Status: Within Functional Limits for tasks assessed                                        Exercises General Exercises - Lower Extremity Ankle Circles/Pumps: AROM;10 reps;Left Quad Sets: AROM;Right;20 reps;Seated        Pertinent Vitals/Pain Pain Assessment: Faces Faces Pain Scale: Hurts little more Pain Location: right leg Pain Descriptors / Indicators: Grimacing;Guarding;Throbbing Pain Intervention(s): Monitored during session;Repositioned  VSS           PT Goals (current goals can now be found in the care plan section) Acute Rehab PT Goals Patient Stated Goal: to get stronger  PT Goal Formulation: With patient Time For Goal Achievement: 03/07/17 Potential to Achieve Goals: Good Progress towards PT goals: Progressing toward goals    Frequency    Min 3X/week      PT Plan Current plan remains appropriate       AM-PAC PT "6 Clicks"  Daily Activity  Outcome Measure  Difficulty turning over in bed (including adjusting bedclothes, sheets and blankets)?: A Little Difficulty moving from lying on back to sitting on the side of the bed? : A Little Difficulty sitting down on and standing up from a chair with arms (e.g., wheelchair, bedside commode, etc,.)?: A Little Help needed moving to and from a bed to chair (including  a wheelchair)?: A Little Help needed walking in hospital room?: A Little Help needed climbing 3-5 steps with a railing? : A Lot 6 Click Score: 17    End of Session Equipment Utilized During Treatment: Gait belt Activity Tolerance: Patient tolerated treatment well Patient left: in chair;with family/visitor present;with call bell/phone within reach Nurse Communication: Mobility status PT Visit Diagnosis: Muscle weakness (generalized) (M62.81);Unsteadiness on feet (R26.81)     Time: 0029-8473 PT Time Calculation (min) (ACUTE ONLY): 24 min  Charges:  $Therapeutic Exercise: 8-22 mins $Therapeutic Activity: 8-22 mins                    G Codes:       Samuel Roth B. Migdalia Dk PT, DPT Acute Rehabilitation  [phone] Pager [phone]     [place] 03/04/2017, 2:53 PM

## 2017-03-04 NOTE — Progress Notes (Signed)
Patient ID: Samuel SimsJohn Ozturk, male   DOB: September 11, 1951, 66 y.o.   MRN: [id number] Postoperative day 1 transtibial amputation. Patient is alert and oriented this morning. Plan for discharge to skilled nursing. Patient was given instructions on knee extension exercises. Plan for prosthetic shrinker from [employer]. Wound VAC functioning well anticipate this will stay in place for 1 week.

## 2017-03-04 NOTE — Progress Notes (Signed)
Patient ID: Samuel Roth, male   DOB: Aug 19, 1951, 66 y.o.   MRN: [id number]  PROGRESS NOTE    Kenna Kirn  WUJ:[id number] DOB: [birth year] DOA: 02/27/2017 PCP: Patient, No Pcp Per   Brief Narrative:  66 y.o. male with medical history significant of glaucoma who is legally blind presented with complaints of flulike symptoms for the last 5-6 days with fever, chills cough and decreased urine output. He was diagnosed with flu and hyperglycemia at an urgent care facility. He was admitted with leukocytosis, acute kidney injury and influenza bronchitis. He was also found to have severe right foot cellulitis with probable osteomyelitis and almost necrotic toes. He was started on broad-spectrum antibiotics.  transferred to [facility] for   amputation  by Dr. Lajoyce Corners.    Assessment & Plan:   1. Right foot severe cellulitis with probable acute osteomyelitis with open wounds and almost necrotic appearing fourth and fifth toes: - Continue vancomycin and zosyn.  - s/p Transtibial amputation  5/27 - Blood Cx negative so far -appreciate ortho consult -stop Abx in 1-2days -pain control  Will need SNF will place social work consult Wound VAC for 1 week as per Dr. Lajoyce Corners   2. ? COPD/former smoker with Probable influenza bronchitis:  - Supportive treatment. Tamiflu for 5 days. Repeat chest x-ray does not show any acute cardio pulm disease.  - improving, wean off O2- now saturating 100% on 2L  4. Acute kidney injury: - Probably due to dehydration -resolved -stopped IVF -renal US- no hydronephrosis   4. New DM -HbA1c is 9.5 -CBg stable on SSI and lantus -DM coordinator consult, difficult situation since Pt and wife are blind   5. History of glaucoma and legally blind: Continue with home eyedrops. Hold oral medication  6. Hyponatremia and hypokalemia:  Potassium continues to be low 3.4 , replete  7. Leukocytosis: Probably secondary to #1. Continue antibiotics. Improving leukocytosis.   -CBC in  am, afebrile  8. Hypertension -stable now, on home regimen  9. Gastroesophageal reflux, started on PPI  DVT prophylaxis:  heparin  Code Status:   full  Family Communication:  none present at bedside   Disposition Plan , may need SNF  Consultants: Orthopedics  Procedures: Transtibial amputation, 5/27  Antimicrobials: Vancomycin and Zosyn discontinue   Objective: very naseous , pain controlled this am  Vitals:   03/03/17 1500 03/03/17 2048 03/03/17 2159 03/04/17 0500  BP: 140/63 (!) 148/82 (!) 161/80 138/75  Pulse: 90 79 81 78  Resp: 17 18    Temp: 98.7 F (37.1 C) 98.4 F (36.9 C) 97.8 F (36.6 C) 98.8 F (37.1 C)  TempSrc: Oral Oral Oral Oral  SpO2: 100% 100% 100% 100%  Weight:      Height:        Intake/Output Summary (Last 24 hours) at 03/04/17 1004 Last data filed at 03/03/17 1837  Gross per 24 hour  Intake                0 ml  Output             1075 ml  Net            -1075 ml   Filed Weights   02/27/17 2000  Weight: 76.7 kg (169 lb 1.5 oz)    Examination:  General exam:AAOx3, comfortable Respiratory system:  CTAB, good air movement Cardiovascular system: S1 & S2 heard, rate controlled Gastrointestinal system: Abdomen is nondistended, soft and nontender. Normal bowel sounds heard. Central nervous system:  Alert and oriented. No focal neurological deficits. Moving extremities Extremities: No cyanosis, clubbing, amputation site LLE Skin: s/p TT amputation with dressing    Data Reviewed: I have personally reviewed following labs and imaging studies  CBC:  Recent Labs Lab 02/27/17 1644 02/28/17 0436 03/01/17 0443 03/01/17 1332 03/02/17 0423 03/04/17 0500  WBC 29.4* 27.1* 23.5*  --  19.8* 11.3*  NEUTROABS 26.2* 23.9* 20.2*  --   --   --   HGB 13.2 10.3* 11.7* 13.9 11.4* 12.1*  HCT 36.8* 28.9* 33.4* 41.0 33.6* 35.4*  MCV 86.6 86.3 86.1  --  87.3 89.2  PLT 207 218 242  --  244 281   Basic Metabolic Panel:  Recent Labs Lab 03/01/17 0443  03/01/17 1332 03/01/17 1646 03/02/17 0423 03/03/17 0329 03/04/17 0500  NA 135 138 137 136 135 136  K 2.8* 3.2* 3.3* 3.1* 3.3* 3.4*  CL 101  --  103 102 99* 98*  CO2 27  --  26 26 27 28   GLUCOSE 244* 178* 176* 126* 128* 131*  BUN 20  --  19 17 13 9   CREATININE 1.23  --  1.08 1.12 1.02 1.02  CALCIUM 7.7*  --  7.6* 7.6* 7.4* 7.6*  MG 2.0  --   --   --   --   --    GFR: Estimated Creatinine Clearance: 72.2 mL/min (by C-G formula based on SCr of 1.02 mg/dL). Liver Function Tests:  Recent Labs Lab 02/27/17 1644 02/28/17 0436 03/01/17 0443  AST 20 15 16   ALT 13* 10* 9*  ALKPHOS 155* 124 131*  BILITOT 1.2 1.2 1.0  PROT 7.9 6.0* 6.0*  ALBUMIN 3.0* 2.1* 1.7*   No results for input(s): LIPASE, AMYLASE in the last 168 hours. No results for input(s): AMMONIA in the last 168 hours. Coagulation Profile: No results for input(s): INR, PROTIME in the last 168 hours. Cardiac Enzymes: No results for input(s): CKTOTAL, CKMB, CKMBINDEX, TROPONINI in the last 168 hours. BNP (last 3 results) No results for input(s): PROBNP in the last 8760 hours. HbA1C: No results for input(s): HGBA1C in the last 72 hours. CBG:  Recent Labs Lab 03/02/17 2239 03/03/17 0626 03/03/17 1028 03/03/17 1214 03/03/17 1656  GLUCAP 123* 151* 112* 109* 110*   Lipid Profile: No results for input(s): CHOL, HDL, LDLCALC, TRIG, CHOLHDL, LDLDIRECT in the last 72 hours. Thyroid Function Tests: No results for input(s): TSH, T4TOTAL, FREET4, T3FREE, THYROIDAB in the last 72 hours. Anemia Panel: No results for input(s): VITAMINB12, FOLATE, FERRITIN, TIBC, IRON, RETICCTPCT in the last 72 hours. Sepsis Labs:  Recent Labs Lab 02/27/17 1644 02/28/17 0436 02/28/17 0820 03/01/17 0443 03/01/17 1646 03/03/17 0329  PROCALCITON 4.43  --   --  1.79  --  0.58  LATICACIDVEN  --  1.1 2.4*  --  1.0  --     Recent Results (from the past 240 hour(s))  Blood culture (routine x 2)     Status: None (Preliminary result)    Collection Time: 02/27/17  4:40 PM  Result Value Ref Range Status   Specimen Description BLOOD RIGHT ANTECUBITAL  Final   Special Requests   Final    BOTTLES DRAWN AEROBIC AND ANAEROBIC Blood Culture adequate volume   Culture   Final    NO GROWTH 4 DAYS Performed at [facility], [address], [place], [place] [address]    Report Status PENDING  Incomplete  Blood culture (routine x 2)     Status: None (Preliminary result)  Collection Time: 02/27/17  4:41 PM  Result Value Ref Range Status   Specimen Description BLOOD LEFT ANTECUBITAL  Final   Special Requests   Final    BOTTLES DRAWN AEROBIC AND ANAEROBIC Blood Culture adequate volume   Culture   Final    NO GROWTH 4 DAYS Performed at [facility], [address] [place], [place] [address]    Report Status PENDING  Incomplete  Surgical pcr screen     Status: None   Collection Time: 02/28/17  8:11 PM  Result Value Ref Range Status   MRSA, PCR NEGATIVE NEGATIVE Final   Staphylococcus aureus NEGATIVE NEGATIVE Final    Comment:        The Xpert SA Assay (FDA approved for NASAL specimens in patients over 65 years of age), is one component of a comprehensive surveillance program.  Test performance has been validated by [facility] for patients greater than or equal to [address]107 year old. It is not intended to diagnose infection nor to guide or monitor treatment.          Radiology Studies: No results found.      Scheduled Meds: . brimonidine  1 drop Both Eyes TID  . brinzolamide  1 drop Left Eye TID  . docusate sodium  100 mg Oral BID  . heparin  5,000 Units Subcutaneous Q8H  . insulin aspart  0-15 Units Subcutaneous TID WC  . insulin aspart  0-5 Units Subcutaneous QHS  . insulin glargine  15 Units Subcutaneous QHS  . latanoprost  1 drop Left Eye QHS  . oseltamivir  30 mg Oral BID  . pantoprazole  40 mg Oral Daily  . pilocarpine  1 drop Left Eye QID  . sodium chloride flush  3 mL Intravenous Q12H  .  timolol  1 drop Left Eye BID   Continuous Infusions: . sodium chloride 10 mL/hr at 03/02/17 2341  . sodium chloride    . methocarbamol (ROBAXIN)  IV    . piperacillin-tazobactam (ZOSYN)  IV Stopped (03/04/17 0534)  . vancomycin Stopped (03/04/17 0403)     LOS: 4 days        Richarda OverlieNayana Shamonica Schadt, MD [facility] Pager [phone]  If 7PM-7AM, please contact night-coverage [email or web address] Password [email or web address] 03/04/2017, 10:04 AM

## 2017-03-04 NOTE — [place] FL2 (Signed)
[place] MEDICAID FL2 LEVEL OF CARE SCREENING TOOL     IDENTIFICATION  Patient Name: Samuel SimsJohn Roth Birthdate: 03/29/1951 Sex: male Admission Date (Current Location): 02/27/2017  [facility] [place]   Facility and Address:  [facility], [address], [place], [place] [address]      Provider Number: [id number]  Attending Physician Name and Address:  Richarda OverlieAbrol, Nayana, MD  Relative Name and Phone Number:       Current Level of Care: Hospital Recommended Level of Care: Skilled Nursing Facility Prior Approval Number:    Date Approved/Denied: 03/01/17 PASRR Number: [id number]  Discharge Plan: SNF    Current Diagnoses: Patient Active Problem List   Diagnosis Date Noted  . 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 Gangrene of right foot (HCC)   . Diabetes mellitus, new onset (HCC)   . Acute renal failure (HCC)   . Acute osteomyelitis (HCC) 02/28/2017  . Acute kidney injury (HCC) 02/27/2017  . Hyperglycemia 02/27/2017  . Leukocytosis 02/27/2017    Orientation RESPIRATION BLADDER Height & Weight     Self, Time, Situation, Place  Normal External catheter, Continent Weight: 169 lb 1.5 oz (76.7 kg) Height:  5\' 9"  (175.3 cm)  BEHAVIORAL SYMPTOMS/MOOD NEUROLOGICAL BOWEL NUTRITION STATUS      Continent Diet (See DC Summary)  AMBULATORY STATUS COMMUNICATION OF NEEDS Skin   Limited Assist Verbally Wound Vac, Surgical wounds (Right foot closed incision with compression wrap; Right Leg  incision with wound vac)                       Personal Care Assistance Level of Assistance  Bathing, Feeding, Dressing Bathing Assistance: Limited assistance Feeding assistance: Limited assistance Dressing Assistance: Limited assistance     Functional Limitations Info  Sight, Hearing, Speech Sight Info: Impaired Hearing Info: Adequate Speech Info: Adequate    SPECIAL CARE FACTORS FREQUENCY  PT (By licensed  PT), OT (By licensed OT)     PT Frequency: 5x week OT Frequency: 5xweek            Contractures      Additional Factors Info  Code Status, Allergies, Insulin Sliding Scale Code Status Info: Full Code Allergies Info: CIPROFLOXACIN    Insulin Sliding Scale Info: 15 units 3x's a day; 5 units at bed; 15 units       Current Medications (03/04/2017):  This is the current hospital active medication list Current Facility-Administered Medications  Medication Dose Route Frequency Provider Last Rate Last Dose  . 0.9 %  sodium chloride infusion   Intravenous Continuous Nadara Mustarduda, Marcus V, MD 10 mL/hr at 03/02/17 2341    . 0.9 %  sodium chloride infusion   Intravenous Continuous Nadara Mustarduda, Marcus V, MD      . acetaminophen (TYLENOL) tablet 650 mg  650 mg Oral Q6H PRN Nadara Mustarduda, Marcus V, MD       Or  . acetaminophen (TYLENOL) suppository 650 mg  650 mg Rectal Q6H PRN Nadara Mustarduda, Marcus V, MD      . alum & mag hydroxide-simeth (MAALOX/MYLANTA) 200-200-20 MG/5ML suspension 30 mL  30 mL Oral Q4H PRN Zannie CoveJoseph, Preetha, MD   30 mL at 03/04/17 0755  . bisacodyl (DULCOLAX) suppository 10 mg  10 mg Rectal Daily PRN Nadara Mustarduda, Marcus V, MD      . brimonidine (ALPHAGAN) 0.15 % ophthalmic solution 1 drop  1 drop Both Eyes TID Glade LloydAlekh, Kshitiz, MD   1 drop  at 03/04/17 1014  . brinzolamide (AZOPT) 1 % ophthalmic suspension 1 drop  1 drop Left Eye TID Glade Lloyd, MD   1 drop at 03/04/17 1015  . docusate sodium (COLACE) capsule 100 mg  100 mg Oral BID Nadara Mustard, MD   100 mg at 03/04/17 1006  . heparin injection 5,000 Units  5,000 Units Subcutaneous Q8H Alekh, Kshitiz, MD   5,000 Units at 03/04/17 1324  . hydrALAZINE (APRESOLINE) injection 10 mg  10 mg Intravenous Q4H PRN Kathlen Mody, MD      . HYDROmorphone (DILAUDID) injection 1 mg  1 mg Intravenous Q2H PRN Nadara Mustard, MD      . insulin aspart (novoLOG) injection 0-15 Units  0-15 Units Subcutaneous TID WC Glade Lloyd, MD   2 Units at 03/04/17 0700  . insulin aspart  (novoLOG) injection 0-5 Units  0-5 Units Subcutaneous QHS Glade Lloyd, MD   3 Units at 02/28/17 2225  . insulin glargine (LANTUS) injection 15 Units  15 Units Subcutaneous QHS Glade Lloyd, MD   15 Units at 03/03/17 2207  . latanoprost (XALATAN) 0.005 % ophthalmic solution 1 drop  1 drop Left Eye QHS Glade Lloyd, MD   1 drop at 03/03/17 2106  . magnesium citrate solution 1 Bottle  1 Bottle Oral Once PRN Nadara Mustard, MD      . methocarbamol (ROBAXIN) tablet 500 mg  500 mg Oral Q6H PRN Nadara Mustard, MD       Or  . methocarbamol (ROBAXIN) 500 mg in dextrose 5 % 50 mL IVPB  500 mg Intravenous Q6H PRN Nadara Mustard, MD      . metoCLOPramide (REGLAN) tablet 5-10 mg  5-10 mg Oral Q8H PRN Nadara Mustard, MD       Or  . metoCLOPramide (REGLAN) injection 5-10 mg  5-10 mg Intravenous Q8H PRN Nadara Mustard, MD      . ondansetron [facility]) tablet 4 mg  4 mg Oral Q6H PRN Nadara Mustard, MD       Or  . ondansetron [facility]) injection 4 mg  4 mg Intravenous Q6H PRN Nadara Mustard, MD      . oseltamivir (TAMIFLU) capsule 30 mg  30 mg Oral BID Glade Lloyd, MD   30 mg at 03/04/17 1006  . oxyCODONE (Oxy IR/ROXICODONE) immediate release tablet 5-10 mg  5-10 mg Oral Q3H PRN Nadara Mustard, MD      . pantoprazole (PROTONIX) injection 40 mg  40 mg Intravenous Q24H Richarda Overlie, MD   40 mg at 03/04/17 1322  . pilocarpine (PILOCAR) 2 % ophthalmic solution 1 drop  1 drop Left Eye QID Glade Lloyd, MD   1 drop at 03/04/17 1324  . piperacillin-tazobactam (ZOSYN) IVPB 3.375 g  3.375 g Intravenous Q8H Pham, Anh P, RPH   Stopped at 03/04/17 1406  . polyethylene glycol (MIRALAX / GLYCOLAX) packet 17 g  17 g Oral Daily PRN Nadara Mustard, MD   17 g at 03/04/17 1006  . potassium chloride (KLOR-CON) packet 40 mEq  40 mEq Oral BID Richarda Overlie, MD   40 mEq at 03/04/17 1322  . promethazine (PHENERGAN) injection 12.5 mg  12.5 mg Intravenous Q6H PRN Leda Gauze, NP   12.5 mg at 02/28/17 0255  . sodium  chloride flush (NS) 0.9 % injection 10-40 mL  10-40 mL Intracatheter PRN Glade Lloyd, MD   10 mL at 03/01/17 1005  . sodium chloride flush (NS) 0.9 % injection  3 mL  3 mL Intravenous Q12H Glade Lloyd, MD   3 mL at 02/28/17 0920  . timolol (TIMOPTIC) 0.5 % ophthalmic solution 1 drop  1 drop Left Eye BID Glade Lloyd, MD   1 drop at 03/04/17 1015  . vancomycin (VANCOCIN) IVPB 1000 mg/200 mL premix  1,000 mg Intravenous Q12H Lucia Gaskins, RPH   Stopped at 03/04/17 0403     Discharge Medications: Please see discharge summary for a list of discharge medications.  Relevant Imaging Results:  Relevant Lab Results:   Additional Information [id number]  Tresa Moore, LCSW

## 2017-03-05 DIAGNOSIS — N170 Acute kidney failure with tubular necrosis: Secondary | ICD-10-CM

## 2017-03-05 DIAGNOSIS — L0390 Cellulitis, unspecified: Secondary | ICD-10-CM

## 2017-03-05 DIAGNOSIS — L03115 Cellulitis of right lower limb: Secondary | ICD-10-CM

## 2017-03-05 LAB — COMPREHENSIVE METABOLIC PANEL
ALT: 16 U/L — ABNORMAL LOW (ref 17–63)
AST: 39 U/L (ref 15–41)
Albumin: 1.7 g/dL — ABNORMAL LOW (ref 3.5–5.0)
Alkaline Phosphatase: 117 U/L (ref 38–126)
Anion gap: 9 (ref 5–15)
BUN: 7 mg/dL (ref 6–20)
CO2: 27 mmol/L (ref 22–32)
Calcium: 7.5 mg/dL — ABNORMAL LOW (ref 8.9–10.3)
Chloride: 100 mmol/L — ABNORMAL LOW (ref 101–111)
Creatinine, Ser: 0.98 mg/dL (ref 0.61–1.24)
GFR calc Af Amer: >60 mL/min (ref >60)
GFR calc non Af Amer: >60 mL/min (ref >60)
Glucose, Bld: 80 mg/dL (ref 65–99)
Potassium: 3.3 mmol/L — ABNORMAL LOW (ref 3.5–5.1)
Sodium: 136 mmol/L (ref 135–145)
Total Bilirubin: 0.6 mg/dL (ref 0.3–1.2)
Total Protein: 6.3 g/dL — ABNORMAL LOW (ref 6.5–8.1)

## 2017-03-05 LAB — GLUCOSE, CAPILLARY
Glucose-Capillary: 121 mg/dL — ABNORMAL HIGH (ref 65–99)
Glucose-Capillary: 82 mg/dL (ref 65–99)
Glucose-Capillary: 121 mg/dL — ABNORMAL HIGH (ref 65–99)
Glucose-Capillary: 158 mg/dL — ABNORMAL HIGH (ref 65–99)
Glucose-Capillary: 171 mg/dL — ABNORMAL HIGH (ref 65–99)

## 2017-03-05 MED ORDER — ONDANSETRON HCL 4 MG PO TABS: 4.0000 mg | ORAL_TABLET | Freq: Every day | ORAL | 1 refills | Status: DC | PRN

## 2017-03-05 MED ORDER — INSULIN GLARGINE 100 UNIT/ML ~~LOC~~ SOLN: 15.0000 [IU] | Freq: Every day | SUBCUTANEOUS | 11 refills | Status: DC

## 2017-03-05 MED ORDER — PANTOPRAZOLE SODIUM 40 MG PO TBEC: 40.0000 mg | DELAYED_RELEASE_TABLET | Freq: Two times a day (BID) | ORAL | 1 refills | Status: DC

## 2017-03-05 MED ORDER — BRIMONIDINE TARTRATE 0.15 % OP SOLN
1.0000 [drp] | Freq: Three times a day (TID) | OPHTHALMIC | Status: DC
  Administered 2017-03-05 – 2017-03-06 (×4): 1 [drp] via OPHTHALMIC
  Filled 2017-03-05: qty 5

## 2017-03-05 MED ORDER — METHOCARBAMOL 500 MG PO TABS: 500.0000 mg | ORAL_TABLET | Freq: Four times a day (QID) | ORAL | 1 refills | Status: DC | PRN

## 2017-03-05 MED ORDER — ENOXAPARIN SODIUM 40 MG/0.4ML ~~LOC~~ SOLN: 40.0000 mg | SUBCUTANEOUS | 0 refills | Status: DC

## 2017-03-05 MED ORDER — INSULIN ASPART 100 UNIT/ML ~~LOC~~ SOLN: SUBCUTANEOUS | 11 refills | Status: AC

## 2017-03-05 MED ORDER — DOCUSATE SODIUM 100 MG PO CAPS: 100.0000 mg | ORAL_CAPSULE | Freq: Two times a day (BID) | ORAL | 0 refills | Status: DC

## 2017-03-05 MED ORDER — POLYETHYLENE GLYCOL 3350 17 G PO PACK: 17.0000 g | PACK | Freq: Every day | ORAL | 0 refills | Status: DC | PRN

## 2017-03-05 MED ORDER — POTASSIUM CHLORIDE CRYS ER 20 MEQ PO TBCR
60.0000 meq | EXTENDED_RELEASE_TABLET | Freq: Once | ORAL | Status: AC
  Administered 2017-03-05: 60 meq via ORAL
  Filled 2017-03-05: qty 3

## 2017-03-05 MED ORDER — PANTOPRAZOLE SODIUM 40 MG PO TBEC: 40.0000 mg | DELAYED_RELEASE_TABLET | Freq: Every day | ORAL | 1 refills | Status: DC

## 2017-03-05 NOTE — Progress Notes (Signed)
Inpatient Diabetes Program Recommendations  AACE/ADA: New Consensus Statement on Inpatient Glycemic Control (2015)  Target Ranges:  Prepandial:   less than 140 mg/dL      Peak postprandial:   less than 180 mg/dL (1-2 hours)      Critically ill patients:  140 - 180 mg/dL   Lab Results  Component Value Date   GLUCAP 121 (H) 03/05/2017   HGBA1C 9.5 (H) 02/28/2017   Spoke with patient about new diabetes diagnosis.  Discussed A1C results (9.5%) and explained what an A1C is and informed patient that his current A1C indicates an average glucose of 240 mg/dl over the past 2-3 months. Discussed basic pathophysiology of DM Type 2, basic home care, importance of checking CBGs and maintaining good CBG control to prevent long-term and short-term complications. Reviewed glucose and A1C goals and explained that patient will need to continue to  Reviewed signs and symptoms of hyperglycemia and hypoglycemia along with treatment for both. Discussed impact of nutrition, exercise, stress, sickness, and medications on diabetes control. Informed patient that he will be prescribed Lantus 15 units at time of d/c. Asked patient to check his glucose 1-2 times per day at home fasting and alternating second check. Explained how the doctor he follows up with can use the log book to continue to make insulin adjustments if needed.  Educated patient and spouse on insulin pen use at home. Reviewed all steps if insulin pen including attachment of needle, 2-unit air shot, dialing up dose, giving injection, removing needle, disposal of sharps, storage of unused insulin, disposal of insulin etc. Patient able to provide successful return demonstration. Also reviewed troubleshooting with insulin pen. Patient agreeable to Lantus at time of d/c. Reviewed and demonstrated how to draw up and administer insulin via insulin penwith vial and syringe. Patient was able to successfully demonstrate how to draw up and administer insulin with vial and  syringe. Patient prefers to be emailed d/c instructions via email, he has a computer that can read him emails and his phone can read him emails as well.    [email or web address]  Patient verbalized understanding of information discussed and he states that he has no further questions at this time related to diabetes. RNs to provide ongoing basic DM education at bedside with this patient and engage patient to actively check blood glucose and administer insulin injections.   Thanks, Christena DeemShannon Eveleen Mcnear RN, MSN, [facility] Inpatient Diabetes Coordinator Team Pager [phone] (8a-5p)

## 2017-03-05 NOTE — Progress Notes (Signed)
Orthopedic Tech Progress Note Patient Details:  Samuel Roth 03-22-1951 [id number]  Patient ID: Samuel Roth, male   DOB: 03-22-1951, [age] y.o.   MRN: [id number]   Samuel Roth 03/05/2017, 8:00 AMCalled [facility] for right BKA stump shrinker.

## 2017-03-05 NOTE — Care Management Important Message (Signed)
Important Message  Patient Details  Name: Samuel SimsJohn Roth MRN: [id number] Date of Birth: 1951-04-27   Medicare Important Message Given:  Yes    Kyla BalzarineShealy, Danel Requena Abena 03/05/2017, 4:01 PM

## 2017-03-05 NOTE — Discharge Summary (Addendum)
Physician Discharge Summary  Samuel Roth MRN: [id number] DOB/AGE: January 21, 1951 66 y.o.  PCP: Patient, No Pcp Per   Admit date: 02/27/2017 Discharge date: 03/05/2017  Discharge Diagnoses:    Principal Problem:   Acute kidney injury [facility]) Active Problems:   Hyperglycemia   Leukocytosis   Acute osteomyelitis ([place])   Gangrene of right foot ([place])   Diabetes mellitus, new onset ([place])   Acute renal failure ([place])   Diabetic polyneuropathy associated with type 1 diabetes mellitus ([place])    Follow-up recommendations Follow-up with PCP in 3-5 days , including all  additional recommended appointments as below Follow-up CBC, CMP in 3-5 days Continue wound VAC for 1 week    Addendum-patient would like to stay another day due to severe heartburn   Current Discharge Medication List    START taking these medications   Details  docusate sodium (COLACE) 100 MG capsule Take 1 capsule (100 mg total) by mouth 2 (two) times daily. Qty: 10 capsule, Refills: 0    enoxaparin (LOVENOX) 40 MG/0.4ML injection Inject 0.4 mLs (40 mg total) into the skin daily. Qty: 14 Syringe, Refills: 0    insulin aspart (NOVOLOG) 100 UNIT/ML injection Correction coverage: Moderate (average weight, post-op)  CBG < 70: implement hypoglycemia protocol  CBG 70 - 120: 0 units  CBG 121 - 150: 2 units  CBG 151 - 200: 3 units  CBG 201 - 250: 5 units  CBG 251 - 300: 8 units  CBG 301 - 350: 11 units  CBG 351 - 400: 15 units  CBG > 400 call MD and obtain STAT lab verification Qty: 10 mL, Refills: 11    insulin glargine (LANTUS) 100 UNIT/ML injection Inject 0.15 mLs (15 Units total) into the skin at bedtime. Qty: 10 mL, Refills: 11    methocarbamol (ROBAXIN) 500 MG tablet Take 1 tablet (500 mg total) by mouth every 6 (six) hours as needed for muscle spasms. Qty: 30 tablet, Refills: 1    ondansetron (ZOFRAN) 4 MG tablet Take 1 tablet (4 mg total) by mouth daily as needed for nausea or vomiting. Qty: 30 tablet,  Refills: 1    pantoprazole (PROTONIX) 40 MG tablet Take 1 tablet (40 mg total) BID  mouth daily. Qty: 30 tablet, Refills: 1    polyethylene glycol (MIRALAX / GLYCOLAX) packet Take 17 g by mouth daily as needed for mild constipation. Qty: 14 each, Refills: 0      CONTINUE these medications which have NOT CHANGED   Details  brimonidine (ALPHAGAN P) 0.1 % SOLN Place 1 drop into both eyes 3 (three) times daily.    brinzolamide (AZOPT) 1 % ophthalmic suspension Place 1 drop into the left eye 3 (three) times daily.    methazolamide (NEPTAZANE) 50 MG tablet Take 50 mg by mouth 3 (three) times daily.    pilocarpine (PILOCAR) 2 % ophthalmic solution Place 1 drop into the left eye 4 (four) times daily.    timolol (TIMOPTIC) 0.5 % ophthalmic solution Place 1 drop into the left eye 2 (two) times daily.    Travoprost, BAK Free, (TRAVATAN Z) 0.004 % SOLN ophthalmic solution Place 1 drop into the left eye daily.         Discharge Condition:Stable   Discharge Instructions Get Medicines reviewed and adjusted: Please take all your medications with you for your next visit with your Primary MD  Please request your Primary MD to go over all hospital tests and procedure/radiological results at the follow up, please ask your Primary MD  to get all Hospital records sent to his/her office.  If you experience worsening of your admission symptoms, develop shortness of breath, life threatening emergency, suicidal or homicidal thoughts you must seek medical attention immediately by calling 911 or calling your MD immediately if symptoms less severe.  You must read complete instructions/literature along with all the possible adverse reactions/side effects for all the Medicines you take and that have been prescribed to you. Take any new Medicines after you have completely understood and accpet all the possible adverse reactions/side effects.   Do not drive when taking Pain medications.   Do not take more  than prescribed Pain, Sleep and Anxiety Medications  Special Instructions: If you have smoked or chewed Tobacco in the last 2 yrs please stop smoking, stop any regular Alcohol and or any Recreational drug use.  Wear Seat belts while driving.  Please note  You were cared for by a hospitalist during your hospital stay. Once you are discharged, your primary care physician will handle any further medical issues. Please note that NO REFILLS for any discharge medications will be authorized once you are discharged, as it is imperative that you return to your primary care physician (or establish a relationship with a primary care physician if you do not have one) for your aftercare needs so that they can reassess your need for medications and monitor your lab values.     Allergies  Allergen Reactions  . Ciprofloxacin Hives      Disposition: SNF   Consults:  Orthopedics    Significant Diagnostic Studies:  Dg Chest 2 View  Result Date: 02/27/2017 CLINICAL DATA:  Acute onset of productive cough and hyperglycemia. Back aches and malaise. Anorexia. Low-grade fever. Initial encounter. EXAM: CHEST  2 VIEW COMPARISON:  None. FINDINGS: The lungs are well-aerated and clear. There is no evidence of focal opacification, pleural effusion or pneumothorax. The heart is normal in size; the mediastinal contour is within normal limits. No acute osseous abnormalities are seen. IMPRESSION: No acute cardiopulmonary process seen. Electronically Signed   By: Garald Balding M.D.   On: 02/27/2017 18:30   US Renal  Result Date: 02/28/2017 CLINICAL DATA:  Acute onset of renal failure.  Initial encounter. EXAM: RENAL / URINARY TRACT ULTRASOUND COMPLETE COMPARISON:  CT of the abdomen and pelvis performed 08/15/2007 FINDINGS: Right Kidney: Length: 9.2 cm. Echogenicity within normal limits. No mass or hydronephrosis visualized. Left Kidney: Length: 11.0 cm. Echogenicity within normal limits. No mass or hydronephrosis  visualized. Bladder: Appears normal for degree of bladder distention. IMPRESSION: Unremarkable renal ultrasound.  No evidence of hydronephrosis. Electronically Signed   By: Garald Balding M.D.   On: 02/28/2017 01:52   Dg Foot 2 Views Right  Result Date: 02/28/2017 CLINICAL DATA:  Left foot open wound and cellulitis. EXAM: RIGHT FOOT - 2 VIEW COMPARISON:  None FINDINGS: Diffuse soft tissue swelling is noted. Gas is noted within the soft tissues of the foot, greatest along the first metatarsal and great toe. No radiographic evidence of acute osteomyelitis noted. No evidence of acute fracture, subluxation or dislocation. A small to moderate calcaneal spur is present. IMPRESSION: Soft tissue gas and swelling suspicious for gas-forming/ necrotizing infection. No radiographic evidence of acute osteomyelitis. Electronically Signed   By: Margarette Canada M.D.   On: 02/28/2017 08:45        Filed Weights   02/27/17 2000  Weight: 76.7 kg (169 lb 1.5 oz)     Microbiology: Recent Results (from the past 240 hour(s))  Blood culture (routine x 2)     Status: None   Collection Time: 02/27/17  4:40 PM  Result Value Ref Range Status   Specimen Description BLOOD RIGHT ANTECUBITAL  Final   Special Requests   Final    BOTTLES DRAWN AEROBIC AND ANAEROBIC Blood Culture adequate volume   Culture   Final    NO GROWTH 5 DAYS Performed at [facility], [address], [place], [place] [address]    Report Status 03/04/2017 FINAL  Final  Blood culture (routine x 2)     Status: None   Collection Time: 02/27/17  4:41 PM  Result Value Ref Range Status   Specimen Description BLOOD LEFT ANTECUBITAL  Final   Special Requests   Final    BOTTLES DRAWN AEROBIC AND ANAEROBIC Blood Culture adequate volume   Culture   Final    NO GROWTH 5 DAYS Performed at [facility], [place] [address] [place], [place] [address]    Report Status 03/04/2017 FINAL  Final  Surgical pcr screen     Status: None   Collection Time:  02/28/17  8:11 PM  Result Value Ref Range Status   MRSA, PCR NEGATIVE NEGATIVE Final   Staphylococcus aureus NEGATIVE NEGATIVE Final    Comment:        The Xpert SA Assay (FDA approved for NASAL specimens in patients over 54 years of age), is one component of a comprehensive surveillance program.  Test performance has been validated by [facility] for patients greater than or equal to 89 year old. It is not intended to diagnose infection nor to guide or monitor treatment.        Blood Culture    Component Value Date/Time   SDES BLOOD LEFT ANTECUBITAL 02/27/2017 1641   SPECREQUEST  02/27/2017 1641    BOTTLES DRAWN AEROBIC AND ANAEROBIC Blood Culture adequate volume   CULT  02/27/2017 1641    NO GROWTH 5 DAYS Performed at [facility], [place] [address] [place], [place] [address]    REPTSTATUS 03/04/2017 FINAL 02/27/2017 1641      Labs: Results for orders placed or performed during the hospital encounter of 02/27/17 (from the past 48 hour(s))  Glucose, capillary     Status: Abnormal   Collection Time: 03/03/17 10:28 AM  Result Value Ref Range   Glucose-Capillary 112 (H) 65 - 99 mg/dL  Glucose, capillary     Status: Abnormal   Collection Time: 03/03/17 12:14 PM  Result Value Ref Range   Glucose-Capillary 109 (H) 65 - 99 mg/dL  Glucose, capillary     Status: Abnormal   Collection Time: 03/03/17  4:56 PM  Result Value Ref Range   Glucose-Capillary 110 (H) 65 - 99 mg/dL  Basic metabolic panel     Status: Abnormal   Collection Time: 03/04/17  5:00 AM  Result Value Ref Range   Sodium 136 135 - 145 mmol/L   Potassium 3.4 (L) 3.5 - 5.1 mmol/L   Chloride 98 (L) 101 - 111 mmol/L   CO2 28 22 - 32 mmol/L   Glucose, Bld 131 (H) 65 - 99 mg/dL   BUN 9 6 - 20 mg/dL   Creatinine, Ser 1.02 0.61 - 1.24 mg/dL   Calcium 7.6 (L) 8.9 - 10.3 mg/dL   GFR calc non Af Amer >60 >60 mL/min   GFR calc Af Amer >60 >60 mL/min    Comment: (NOTE) The eGFR has been calculated using the  CKD EPI equation.  This calculation has not been validated in all clinical situations. eGFR's persistently <60 mL/min signify possible Chronic Kidney Disease.    Anion gap 10 5 - 15  CBC     Status: Abnormal   Collection Time: 03/04/17  5:00 AM  Result Value Ref Range   WBC 11.3 (H) 4.0 - 10.5 K/uL   RBC 3.97 (L) 4.22 - 5.81 MIL/uL   Hemoglobin 12.1 (L) 13.0 - 17.0 g/dL   HCT 35.4 (L) 39.0 - 52.0 %   MCV 89.2 78.0 - 100.0 fL   MCH 30.5 26.0 - 34.0 pg   MCHC 34.2 30.0 - 36.0 g/dL   RDW 13.2 11.5 - 15.5 %   Platelets 281 150 - 400 K/uL  Glucose, capillary     Status: Abnormal   Collection Time: 03/04/17 12:15 PM  Result Value Ref Range   Glucose-Capillary 107 (H) 65 - 99 mg/dL  Glucose, capillary     Status: Abnormal   Collection Time: 03/04/17  6:23 PM  Result Value Ref Range   Glucose-Capillary 135 (H) 65 - 99 mg/dL  Glucose, capillary     Status: None   Collection Time: 03/04/17 10:39 PM  Result Value Ref Range   Glucose-Capillary 90 65 - 99 mg/dL  Comprehensive metabolic panel     Status: Abnormal   Collection Time: 03/05/17  3:16 AM  Result Value Ref Range   Sodium 136 135 - 145 mmol/L   Potassium 3.3 (L) 3.5 - 5.1 mmol/L   Chloride 100 (L) 101 - 111 mmol/L   CO2 27 22 - 32 mmol/L   Glucose, Bld 80 65 - 99 mg/dL   BUN 7 6 - 20 mg/dL   Creatinine, Ser 0.98 0.61 - 1.24 mg/dL   Calcium 7.5 (L) 8.9 - 10.3 mg/dL   Total Protein 6.3 (L) 6.5 - 8.1 g/dL   Albumin 1.7 (L) 3.5 - 5.0 g/dL   AST 39 15 - 41 U/L   ALT 16 (L) 17 - 63 U/L   Alkaline Phosphatase 117 38 - 126 U/L   Total Bilirubin 0.6 0.3 - 1.2 mg/dL   GFR calc non Af Amer >60 >60 mL/min   GFR calc Af Amer >60 >60 mL/min    Comment: (NOTE) The eGFR has been calculated using the CKD EPI equation. This calculation has not been validated in all clinical situations. eGFR's persistently <60 mL/min signify possible Chronic Kidney Disease.    Anion gap 9 5 - 15  Glucose, capillary     Status: None   Collection Time:  03/05/17  7:14 AM  Result Value Ref Range   Glucose-Capillary 82 65 - 99 mg/dL     Lipid Panel  No results found for: CHOL, TRIG, HDL, CHOLHDL, VLDL, LDLCALC, LDLDIRECT   Lab Results  Component Value Date   HGBA1C 9.5 (H) 02/28/2017      HPI :  67 y.o.malewith medical history significant of glaucoma who is legally blind presented with complaints of flulike symptoms for the last 5-6 days with fever, chills cough and decreased urine output. He was diagnosed with flu and hyperglycemia at an urgent care facility. He was admitted with leukocytosis, acute kidney injury and influenza bronchitis. He was also found to have severe right foot cellulitis with probable osteomyelitis and almost necrotic toes. He was started on broad-spectrum antibiotics.  transferred to [facility] , underwent  amputation  by Dr. Sharol Given 5/27.    HOSPITAL COURSE:    1. Right foot severe cellulitis with probable  acute osteomyelitis with open wounds and almost necrotic appearing fourth and fifth toes: - Initially placed on vancomycin and zosyn.  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 Antibiotics discontinued subsequently after discussion with orthopedics - Blood Cx negative so far -appreciate ortho consult -pain control , DVT prophylaxis per orthopedics Will need SNF   Wound VAC for 1 week as per Dr. Sharol Given   2. ? COPD/former smoker with Probable influenza bronchitis:  - Supportive treatment. Tamiflu for 5 days. Repeat chest x-ray does not show any acute cardio pulm disease.  - improving, wean off O2- now saturating 100% on 2L  4. Acute kidney injury: - Probably due to dehydration -resolved -stopped IVF -renal US- no hydronephrosis   4. New DM -HbA1c is 9.5 -CBg stable on SSI and lantus -DM coordinator consult, difficult situation since Pt and wife are blind   5. History of glaucoma and legally blind: Continue with home eyedrops. Hold oral medication  6. Hyponatremia and hypokalemia: Potassium  continues to be low 3.4 , repleted  7. Leukocytosis: Probably secondary to #1. Continue antibiotics. Improving leukocytosis.   -CBC in am, afebrile  8. Hypertension -stable now, on home regimen  9. Gastroesophageal reflux, started on PPI    Discharge Exam:  Blood pressure 136/72, pulse 69, temperature 98.4 F (36.9 C), temperature source Oral, resp. rate 18, height 5' 9" (1.753 m), weight 76.7 kg (169 lb 1.5 oz), SpO2 98 %.   General exam:AAOx3, comfortable Respiratory system:  CTAB, good air movement Cardiovascular system: S1 & S2 heard, rate controlled Gastrointestinal system: Abdomen is nondistended, soft and nontender. Normal bowel sounds heard. Central nervous system: Alert and oriented. No focal neurological deficits. Moving extremities Extremities: No cyanosis, clubbing, amputation site LLE Skin: s/p TT amputation with dressing   Follow-up Information    Newt Minion, MD Follow up in 1 week(s).   Specialty:  Orthopedic Surgery Contact information: [place] [address] [phone]        Primary care provider. Schedule an appointment as soon as possible for a visit.   Why:  3-5 days, follow CBC, CMP          Signed:   03/05/2017, 9:00 AM        Time spent >1 hour

## 2017-03-06 DIAGNOSIS — R739 Hyperglycemia, unspecified: Secondary | ICD-10-CM | POA: Diagnosis not present

## 2017-03-06 DIAGNOSIS — E1042 Type 1 diabetes mellitus with diabetic polyneuropathy: Secondary | ICD-10-CM | POA: Diagnosis not present

## 2017-03-06 DIAGNOSIS — M8610 Other acute osteomyelitis, unspecified site: Secondary | ICD-10-CM | POA: Diagnosis not present

## 2017-03-06 DIAGNOSIS — S88019A Complete traumatic amputation at knee level, unspecified lower leg, initial encounter: Secondary | ICD-10-CM | POA: Diagnosis not present

## 2017-03-06 DIAGNOSIS — D72829 Elevated white blood cell count, unspecified: Secondary | ICD-10-CM | POA: Diagnosis not present

## 2017-03-06 DIAGNOSIS — I96 Gangrene, not elsewhere classified: Secondary | ICD-10-CM | POA: Diagnosis not present

## 2017-03-06 DIAGNOSIS — R278 Other lack of coordination: Secondary | ICD-10-CM | POA: Diagnosis not present

## 2017-03-06 DIAGNOSIS — R488 Other symbolic dysfunctions: Secondary | ICD-10-CM | POA: Diagnosis not present

## 2017-03-06 DIAGNOSIS — S91301A Unspecified open wound, right foot, initial encounter: Secondary | ICD-10-CM | POA: Diagnosis not present

## 2017-03-06 DIAGNOSIS — H409 Unspecified glaucoma: Secondary | ICD-10-CM | POA: Diagnosis not present

## 2017-03-06 DIAGNOSIS — J101 Influenza due to other identified influenza virus with other respiratory manifestations: Secondary | ICD-10-CM | POA: Diagnosis not present

## 2017-03-06 DIAGNOSIS — Z4889 Encounter for other specified surgical aftercare: Secondary | ICD-10-CM | POA: Diagnosis not present

## 2017-03-06 DIAGNOSIS — N289 Disorder of kidney and ureter, unspecified: Secondary | ICD-10-CM | POA: Diagnosis not present

## 2017-03-06 DIAGNOSIS — S88111D Complete traumatic amputation at level between knee and ankle, right lower leg, subsequent encounter: Secondary | ICD-10-CM | POA: Diagnosis not present

## 2017-03-06 DIAGNOSIS — L0390 Cellulitis, unspecified: Secondary | ICD-10-CM | POA: Diagnosis not present

## 2017-03-06 DIAGNOSIS — L03115 Cellulitis of right lower limb: Secondary | ICD-10-CM | POA: Diagnosis not present

## 2017-03-06 DIAGNOSIS — Z89511 Acquired absence of right leg below knee: Secondary | ICD-10-CM | POA: Diagnosis not present

## 2017-03-06 DIAGNOSIS — M6281 Muscle weakness (generalized): Secondary | ICD-10-CM | POA: Diagnosis not present

## 2017-03-06 DIAGNOSIS — N19 Unspecified kidney failure: Secondary | ICD-10-CM | POA: Diagnosis not present

## 2017-03-06 DIAGNOSIS — E119 Type 2 diabetes mellitus without complications: Secondary | ICD-10-CM | POA: Diagnosis not present

## 2017-03-06 LAB — CREATININE, SERUM
Creatinine, Ser: 1.07 mg/dL (ref 0.61–1.24)
GFR calc Af Amer: >60 mL/min (ref >60)
GFR calc non Af Amer: >60 mL/min (ref >60)

## 2017-03-06 LAB — GLUCOSE, CAPILLARY
Glucose-Capillary: 139 mg/dL — ABNORMAL HIGH (ref 65–99)
Glucose-Capillary: 152 mg/dL — ABNORMAL HIGH (ref 65–99)

## 2017-03-06 MED ORDER — PANTOPRAZOLE SODIUM 40 MG PO TBEC
40.0000 mg | DELAYED_RELEASE_TABLET | ORAL | Status: DC
  Administered 2017-03-06: 40 mg via ORAL
  Filled 2017-03-06: qty 1

## 2017-03-06 NOTE — Clinical Social Work Note (Signed)
Clinical Social Work Assessment  Patient Details  Name: Samuel Roth MRN: [id number] Date of Birth: 1951/06/11  Date of referral:  03/01/17               Reason for consult:  Facility Placement                Permission sought to share information with:  Facility Art therapist granted to share information::  Yes, Verbal Permission Granted  Name::        Agency::  SNF  Relationship::     Contact Information:     Housing/Transportation Living arrangements for the past 2 months:  Single Family Home Source of Information:  Patient Patient Interpreter Needed:  None Criminal Activity/Legal Involvement Pertinent to Current Situation/Hospitalization:  No - Comment as needed Significant Relationships:  Spouse Lives with:  Spouse Do you feel safe going back to the place where you live?  No Need for family participation in patient care:  No (Coment)  Care giving concerns:  Patient has vision impairment and resides with spouse who cannot care for him. Patient not safe to DC home at this time.  Social Worker assessment / plan:  CSW met with patient at bedside to discuss DC recommendation by clinical team for SNF placement. CSW explained role and SNF placement/options. Patient gave permission to send out offers to facilities for bed offers. Patient married and concerned about his wife, while is in SNF. Patient will f/u with financial concerns with spouse.  FL2 obtained, passr completed. Offers sent.  Employment status:  [employer] information:  Medicare PT Recommendations:  [place] / Referral to community resources:  [place]  Patient/Family's Response to care:  Patient appreciative of assistance from [place]. No issues or concerns identified with care at this time.  Patient/Family's Understanding of and Emotional Response to Diagnosis, Current Treatment, and Prognosis:  Patient has good understanding of diagnosis, current  treatment and prognosis. Pt hopeful that he will improve with rehabilitation. No issues identified at this time.   Emotional Assessment Appearance:  Appears stated age Attitude/Demeanor/Rapport:   (Cooperative) Affect (typically observed):  Accepting, Appropriate Orientation:  Oriented to Self, Oriented to Place, Oriented to  Time, Oriented to Situation Alcohol / Substance use:  Not Applicable Psych involvement (Current and /or in the community):  No (Comment)  Discharge Needs  Concerns to be addressed:  Care Coordination Readmission within the last 30 days:  No Current discharge risk:  Physical Impairment, Dependent with Mobility Barriers to Discharge:  No Barriers Identified   Normajean Baxter, LCSW 03/06/2017, 12:28 PM

## 2017-03-06 NOTE — Progress Notes (Signed)
RN called report to SNF. Patient is awaiting PTAR to transport to SNF. Patient belongings with patient. Patient is not in distress. Provena wound vac applied.

## 2017-03-06 NOTE — Progress Notes (Signed)
Pharmacy Antibiotic Note  Samuel SimsJohn Roth is a 66 y.o. male presented to the ED from Urgent Care  on 02/27/2017 with necrotic wound infection of foot.  Patient continues on vancomycin and zosyn for gangrene ulceration right forefoot, negative for osteo.  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  Patient remains afebrile and his WBC is improving.  AM vancomycin dose was not given on 03/05/17 and patient's discharged is delayed.   Plan: Continue vanc 1gm IV Q12H Continue Zosyn 3.375gm IV Q8H, 4 hr infusion Monitor renal fxn, clinical progress, vanc trough tomorrow if still not here F/U abx LOT   Height: 5\' 9"  (175.3 cm) Weight: 169 lb 1.5 oz (76.7 kg) IBW/kg (Calculated) : 70.7  Temp (24hrs), Avg:98.7 F (37.1 C), Min:98.6 F (37 C), Max:98.8 F (37.1 C)   Recent Labs Lab 02/27/17 1644 02/28/17 0436 02/28/17 0820 03/01/17 0443 03/01/17 1646 03/02/17 0423 03/03/17 0329 03/04/17 0500 03/05/17 0316 03/06/17 0532  WBC 29.4* 27.1*  --  23.5*  --  19.8*  --  11.3*  --   --   CREATININE 2.10* 1.24  --  1.23 1.08 1.12 1.02 1.02 0.98 1.07  LATICACIDVEN  --  1.1 2.4*  --  1.0  --   --   --   --   --     Estimated Creatinine Clearance: 68.8 mL/min (by C-G formula based on SCr of 1.07 mg/dL).    Allergies  Allergen Reactions  . Ciprofloxacin Hives    Tamiflu 5/24 >> 5/29 Vanc 5/24 >> Zosyn 5/24 >>  5/23 BCx - negative 5/24 surgical PCR - negative    Samuel Roth D. Samuel Roth, PharmD, BCPS Pager:  [phone] 03/06/2017, 9:49 AM

## 2017-03-06 NOTE — Progress Notes (Signed)
Physical Therapy Treatment Patient Details Name: Samuel Roth MRN: [id number] DOB: [birth year] Today's Date: 03/06/2017    History of Present Illness Samuel Roth is a 66 y.o. male with medical history significant of glaucoma who is legally blind presented with complaints of flu-like symptoms, cough with whitish expectoration associated with fever, chills, malaise, decreased appetite and decreased urine output, nausea and intermittent vomiting; xray R foot = Soft tissue gas and swelling suspicious for gas-forming/ necrotizing, s/p trans met amputation on 5/26 and BKA 5/27 PMH: Glaucoma, legally blind     PT Comments    Pt is making good progress towards his goals. Pt is limited in his ambulation by fatigue of his UE. Pt currently supervision for bed mobility, and min guard for transfers to RW and ambulation of 12 feet with RW. Pt requires skilled PT to continue to progress his ambulation and improve overall strength and endurance to safely mobilize in his discharge environment.    Follow Up Recommendations  SNF     Equipment Recommendations   (TBD )    Recommendations for Other Services       Precautions / Restrictions Precautions Precautions: Fall;Other (comment) Precaution Comments: legally blind Restrictions Weight Bearing Restrictions: Yes RLE Weight Bearing: Non weight bearing    Mobility  Bed Mobility Overal bed mobility: Needs Assistance Bed Mobility: Supine to Sit       Sit to supine: Supervision   General bed mobility comments: for line management  Transfers Overall transfer level: Needs assistance Equipment used: Rolling walker (2 wheeled) Transfers: Sit to/from Stand Sit to Stand: Min guard         General transfer comment: for safety and line management  Ambulation/Gait Ambulation/Gait assistance: Min guard Ambulation Distance (Feet): 12 Feet Assistive device: Rolling walker (2 wheeled) Gait Pattern/deviations: Trunk flexed (hop to pattern) Gait  velocity: slowed Gait velocity interpretation: Below normal speed for age/gender General Gait Details: able to utilize UE strength to advance L LE vc for more upright posture to maximize UE forces          Balance Overall balance assessment: Needs assistance Sitting-balance support: No upper extremity supported;Feet supported Sitting balance-Leahy Scale: Good     Standing balance support: Bilateral upper extremity supported Standing balance-Leahy Scale: Fair Standing balance comment: requires RW                            Cognition Arousal/Alertness: Awake/alert Behavior During Therapy: WFL for tasks assessed/performed Overall Cognitive Status: Within Functional Limits for tasks assessed                                               Pertinent Vitals/Pain Pain Assessment: No/denies pain Pain Location: right leg Pain Descriptors / Indicators: Throbbing (Throbbing in residual limb in dependent position) Pain Intervention(s): Monitored during session  VSS           PT Goals (current goals can now be found in the care plan section) Acute Rehab PT Goals Patient Stated Goal: to get stronger  PT Goal Formulation: With patient Time For Goal Achievement: 03/07/17 Potential to Achieve Goals: Good Progress towards PT goals: Progressing toward goals    Frequency    Min 3X/week      PT Plan Current plan remains appropriate       AM-PAC PT "6 Clicks" Daily Activity  Outcome Measure  Difficulty turning over in bed (including adjusting bedclothes, sheets and blankets)?: A Little Difficulty moving from lying on back to sitting on the side of the bed? : A Little Difficulty sitting down on and standing up from a chair with arms (e.g., wheelchair, bedside commode, etc,.)?: A Little Help needed moving to and from a bed to chair (including a wheelchair)?: A Little Help needed walking in hospital room?: A Lot Help needed climbing 3-5 steps with a  railing? : Total 6 Click Score: 15    End of Session Equipment Utilized During Treatment: Gait belt Activity Tolerance: Patient tolerated treatment well Patient left: in chair;with family/visitor present;with call bell/phone within reach Nurse Communication: Mobility status PT Visit Diagnosis: Muscle weakness (generalized) (M62.81);Unsteadiness on feet (R26.81)     Time: 5894-8347 PT Time Calculation (min) (ACUTE ONLY): 17 min  Charges:  $Gait Training: 8-22 mins                    G Codes:       Shmuel Girgis B. Migdalia Dk PT, DPT Acute Rehabilitation  [phone] Pager [phone]     [place]uth 03/06/2017, 2:17 PM

## 2017-03-06 NOTE — Progress Notes (Signed)
Patient discharged to SNF via PTAR. Patient is not in distress.

## 2017-03-06 NOTE — Clinical Social Work Placement (Signed)
   CLINICAL SOCIAL WORK PLACEMENT  NOTE  Date:  03/06/2017  Patient Details  Name: Samuel Roth MRN: [id number] Date of Birth: January 09, 1951  Clinical Social Work is seeking post-discharge placement for this patient at the Skilled  Nursing Facility level of care (*CSW will initial, date and re-position this form in  chart as items are completed):  Yes   Patient/family provided with [facility] Clinical Social Work Department's list of facilities offering this level of care within the geographic area requested by the patient (or if unable, by the patient's family).  Yes   Patient/family informed of their freedom to choose among providers that offer the needed level of care, that participate in Medicare, Medicaid or managed care program needed by the patient, have an available bed and are willing to accept the patient.  Yes   Patient/family informed of [facility]'s ownership interest in [facility] and [facility], as well as of the fact that they are under no obligation to receive care at these facilities.  PASRR submitted to EDS on 03/04/17     PASRR number received on 03/04/17     Existing PASRR number confirmed on 03/04/17     FL2 transmitted to all facilities in geographic area requested by pt/family on 03/04/17     FL2 transmitted to all facilities within larger geographic area on 03/04/17     Patient informed that his/her managed care company has contracts with or will negotiate with certain facilities, including the following:        Yes   Patient/family informed of bed offers received.  [facility] bed [facility]     Physician recommends and patient chooses bed at      Patient to be transferred to [facility] on 03/06/17.  Patient to be transferred to facility by [facility]     Patient family notified on 03/06/17 of transfer.  Name of family member notified:  wife     PHYSICIAN Please prepare priority discharge summary, including medications, Please prepare  prescriptions, Please sign FL2     Additional Comment:    _______________________________________________ Tresa MoorePatricia V Clydell Sposito, LCSW 03/06/2017, 12:51 PM

## 2017-03-06 NOTE — Social Work (Addendum)
Clinical Social Worker facilitated patient discharge including contacting patient family and facility to confirm patient discharge plans.  Clinical information faxed to facility and family agreeable with plan.  CSW arranged ambulance transport via [facility] to [facility].  RN to call [phone] report prior to discharge. Patient going to [address].  Clinical Social Worker will sign off for now as social work intervention is no longer needed. Please consult us again if new need arises.  Keene BreathPatricia Dossie Ocanas, LCSW Clinical Social Worker [phone]

## 2017-03-13 ENCOUNTER — Encounter (INDEPENDENT_AMBULATORY_CARE_PROVIDER_SITE_OTHER): Payer: Self-pay | Admitting: Family

## 2017-03-13 ENCOUNTER — Ambulatory Visit (INDEPENDENT_AMBULATORY_CARE_PROVIDER_SITE_OTHER): Payer: Medicare Other | Admitting: Family

## 2017-03-13 VITALS — Ht 69.0 in | Wt 169.0 lb

## 2017-03-13 DIAGNOSIS — I96 Gangrene, not elsewhere classified: Secondary | ICD-10-CM | POA: Diagnosis not present

## 2017-03-13 DIAGNOSIS — H4089 Other specified glaucoma: Secondary | ICD-10-CM | POA: Diagnosis not present

## 2017-03-13 DIAGNOSIS — Z89511 Acquired absence of right leg below knee: Secondary | ICD-10-CM | POA: Insufficient documentation

## 2017-03-13 DIAGNOSIS — E1042 Type 1 diabetes mellitus with diabetic polyneuropathy: Secondary | ICD-10-CM

## 2017-03-13 DIAGNOSIS — R7309 Other abnormal glucose: Secondary | ICD-10-CM | POA: Diagnosis not present

## 2017-03-13 DIAGNOSIS — Z7189 Other specified counseling: Secondary | ICD-10-CM | POA: Diagnosis not present

## 2017-03-13 NOTE — Progress Notes (Signed)
   Post-Op Visit Note   Patient: Samuel Roth           Date of Birth: 06/16/1951           MRN: [id number] Visit Date: 03/13/2017 PCP: Patient, No Pcp Per  Chief Complaint:  Chief Complaint  Patient presents with  . Right Leg - Routine Post Op    03/03/17 right BKA with prevena and shrinker    HPI:  The patient is a 66 year old gentleman who presents today status post right below the knee amputation on May 27. The wound VAC was removed today. Staples are in place. He has no concerns. No phantom pain. Wonders when he can return to work.    Ortho Exam Incision well approximated with staples there is no erythema no drainage no odor no sign of infection there is minimal swelling.  Visit Diagnoses:  1. Diabetic polyneuropathy associated with type 1 diabetes mellitus (HCC)   2. Acquired absence of right leg below knee (HCC)     Plan: Orders provided to cleanse the incision daily. Apply dry dressing daily. Wear the shrinker around-the-clock. Discussed that he likely will be able to return for seated work following his next postop.  Follow-Up Instructions: Return in about 2 weeks (around 03/27/2017).   Imaging: No results found.  Orders:  No orders of the defined types were placed in this encounter.  No orders of the defined types were placed in this encounter.    PMFS History: Patient Active Problem List   Diagnosis Date Noted  . Acquired absence of right leg below knee (HCC) 03/13/2017  . 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 Diabetes mellitus, new onset (HCC)   . Acute renal failure (HCC)   . Acute kidney injury (HCC) 02/27/2017  . Hyperglycemia 02/27/2017  . Leukocytosis 02/27/2017   Past Medical History:  Diagnosis Date  . Glaucoma   . Legally blind     No family history on file.  Past Surgical History:  Procedure Laterality Date  . AMPUTATION Right 03/01/2017   Procedure: RIGHT TRANS METATARSAL AMPUTATION;  Surgeon: Nadara Mustarduda,  Marcus V, MD;  [facility];  Service: Orthopedics;  Laterality: Right;  . AMPUTATION Right 03/03/2017   Procedure: RIGHT BKA;  Surgeon: Nadara Mustarduda, Marcus V, MD;  [facility];  Service: Orthopedics;  Laterality: Right;  . EYE SURGERY    . TIBIA FRACTURE SURGERY     Social History   Occupational History  . Not on file.   Social History Main Topics  . Smoking status: Former Games developermoker  . Smokeless tobacco: Former NeurosurgeonUser  . Alcohol use Yes     Comment: social  . Drug use: No  . Sexual activity: Not on file

## 2017-03-27 ENCOUNTER — Ambulatory Visit (INDEPENDENT_AMBULATORY_CARE_PROVIDER_SITE_OTHER): Payer: Medicare Other | Admitting: Orthopedic Surgery

## 2017-03-27 DIAGNOSIS — Z89511 Acquired absence of right leg below knee: Secondary | ICD-10-CM

## 2017-03-27 DIAGNOSIS — E1042 Type 1 diabetes mellitus with diabetic polyneuropathy: Secondary | ICD-10-CM

## 2017-03-28 ENCOUNTER — Encounter (INDEPENDENT_AMBULATORY_CARE_PROVIDER_SITE_OTHER): Payer: Self-pay | Admitting: Orthopedic Surgery

## 2017-03-28 ENCOUNTER — Telehealth (INDEPENDENT_AMBULATORY_CARE_PROVIDER_SITE_OTHER): Payer: Self-pay | Admitting: Orthopedic Surgery

## 2017-03-28 NOTE — Progress Notes (Signed)
   Office Visit Note   Patient: Samuel Roth Isenhower           Date of Birth: Jan 12, 1951           MRN: [id number] Visit Date: 03/27/2017              Requested by: No referring provider defined for this encounter. PCP: Patient, No Pcp Per  Chief Complaint  Patient presents with  . Right Leg - Follow-up    03/03/17 RBKA      HPI: Patient is 3 weeks status post right transtibial amputation he states his little bit of drainage on the medial side.  Assessment & Plan: Visit Diagnoses:  1. Acquired absence of right leg below knee (HCC)   2. Diabetic polyneuropathy associated with type 1 diabetes mellitus (HCC)     Plan: Staples removed shrinker reapplied. Patient will wear the shrinker around-the-clock work on knee extension wash the leg was soaked in water. He will follow-up with [facility] for a prosthesis and stump shrinker's.  Follow-Up Instructions: Return in about 3 weeks (around 04/17/2017).   Ortho Exam  Patient is alert, oriented, no adenopathy, well-dressed, normal affect, normal respiratory effort. Examination there is no redness no cellulitis no signs of infection. The wound edges are well approximated there is a little bit of drainage medially with clear serosanguineous fluid no signs of infection.  Imaging: No results found.  Labs: Lab Results  Component Value Date   HGBA1C 9.5 (H) 02/28/2017   CRP 29.1 (H) 03/01/2017   REPTSTATUS 03/04/2017 FINAL 02/27/2017   CULT  02/27/2017    NO GROWTH 5 DAYS Performed at [facility], [address] [place], [place] [address]     Orders:  No orders of the defined types were placed in this encounter.  No orders of the defined types were placed in this encounter.    Procedures: No procedures performed  Clinical Data: No additional findings.  ROS:  All other systems negative, except as noted in the HPI. Review of Systems  Objective: Vital Signs: There were no vitals taken for this visit.  Specialty Comments:    No specialty comments available.  PMFS History: Patient Active Problem List   Diagnosis Date Noted  . Acquired absence of right leg below knee (HCC) 03/13/2017  . 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 Diabetes mellitus, new onset (HCC)   . Acute renal failure (HCC)   . Acute kidney injury (HCC) 02/27/2017  . Hyperglycemia 02/27/2017  . Leukocytosis 02/27/2017   Past Medical History:  Diagnosis Date  . Glaucoma   . Legally blind     History reviewed. No pertinent family history.  Past Surgical History:  Procedure Laterality Date  . AMPUTATION Right 03/01/2017   Procedure: RIGHT TRANS METATARSAL AMPUTATION;  Surgeon: Nadara Mustarduda, Marcus V, MD;  [facility];  Service: Orthopedics;  Laterality: Right;  . AMPUTATION Right 03/03/2017   Procedure: RIGHT BKA;  Surgeon: Nadara Mustarduda, Marcus V, MD;  [facility];  Service: Orthopedics;  Laterality: Right;  . EYE SURGERY    . TIBIA FRACTURE SURGERY     Social History   Occupational History  . Not on file.   Social History Main Topics  . Smoking status: Former Games developermoker  . Smokeless tobacco: Former NeurosurgeonUser  . Alcohol use Yes     Comment: social  . Drug use: No  . Sexual activity: Not on file

## 2017-03-28 NOTE — Telephone Encounter (Signed)
Star (Child psychotherapistocial Worker) from [facility] called advised received a Rx for [facility] that have diag. Code, functional level but, Rx does not state what it is for. The number to contact Star is [phone]

## 2017-03-28 NOTE — Telephone Encounter (Signed)
Patient needs a BKA prosthesis from [facility] for K2 level ambulator. Patient needs to be wearing a medical compression stocking daily washes leg with soap and water daily and wearing of the stocking around-the-clock.

## 2017-03-28 NOTE — Telephone Encounter (Signed)
This pt was seen in the office yesterday but there is not a note in his chart? Nothing to indicate he had a visit yesterday. The skilled facility is calling stating that he was given an rx for [facility] but nothing was written on it.  03/03/17 right bka staples removed yesterday.

## 2017-03-29 DIAGNOSIS — E119 Type 2 diabetes mellitus without complications: Secondary | ICD-10-CM | POA: Diagnosis not present

## 2017-03-29 DIAGNOSIS — R5381 Other malaise: Secondary | ICD-10-CM | POA: Diagnosis not present

## 2017-03-29 DIAGNOSIS — H4089 Other specified glaucoma: Secondary | ICD-10-CM | POA: Diagnosis not present

## 2017-03-29 DIAGNOSIS — I96 Gangrene, not elsewhere classified: Secondary | ICD-10-CM | POA: Diagnosis not present

## 2017-03-29 NOTE — Telephone Encounter (Signed)
I called to advise and was asked to call back in an hour or so she ws not in and reception did not know how to transfer me to her voicemail.

## 2017-04-02 NOTE — Telephone Encounter (Signed)
Called and lm on vm for social worker and to call if she needs this faxed. Will need a fax number to send the order.

## 2017-04-04 ENCOUNTER — Telehealth (INDEPENDENT_AMBULATORY_CARE_PROVIDER_SITE_OTHER): Payer: Self-pay | Admitting: Orthopedic Surgery

## 2017-04-04 DIAGNOSIS — H409 Unspecified glaucoma: Secondary | ICD-10-CM | POA: Diagnosis not present

## 2017-04-04 DIAGNOSIS — K219 Gastro-esophageal reflux disease without esophagitis: Secondary | ICD-10-CM | POA: Diagnosis not present

## 2017-04-04 DIAGNOSIS — Z4781 Encounter for orthopedic aftercare following surgical amputation: Secondary | ICD-10-CM | POA: Diagnosis not present

## 2017-04-04 DIAGNOSIS — I10 Essential (primary) hypertension: Secondary | ICD-10-CM | POA: Diagnosis not present

## 2017-04-04 DIAGNOSIS — E1042 Type 1 diabetes mellitus with diabetic polyneuropathy: Secondary | ICD-10-CM | POA: Diagnosis not present

## 2017-04-04 DIAGNOSIS — H548 Legal blindness, as defined in USA: Secondary | ICD-10-CM | POA: Diagnosis not present

## 2017-04-04 NOTE — Telephone Encounter (Signed)
Patient called asked for a call back advised he is not in [facility] anymore. Patient said he has been home since Friday. Patient said he has not had (PT, OT), Walker, bath chair. Patient said he does not have anything. Patient said he had to buy his own wheelchair. The number to contact patient is [phone] Cell

## 2017-04-04 NOTE — Telephone Encounter (Signed)
I called the pt. We spent roughly 10 min on the phone. The pt was upset about the DME he had been given and decided to purchase his own. He states that the OT and PT has been set up with [facility]. They are sch to come out on Friday (tomorrow) and that he will give the home health agency the opportunity to work with him. He was very upset with his time at [facility] and much of the time was spent listening to the pt. He did feel better and will call if he has any other concerns.

## 2017-04-04 NOTE — Telephone Encounter (Signed)
Returned call to patient left message to call back. 

## 2017-04-04 NOTE — Telephone Encounter (Signed)
error 

## 2017-04-05 DIAGNOSIS — I10 Essential (primary) hypertension: Secondary | ICD-10-CM | POA: Diagnosis not present

## 2017-04-05 DIAGNOSIS — E1042 Type 1 diabetes mellitus with diabetic polyneuropathy: Secondary | ICD-10-CM | POA: Diagnosis not present

## 2017-04-05 DIAGNOSIS — H409 Unspecified glaucoma: Secondary | ICD-10-CM | POA: Diagnosis not present

## 2017-04-05 DIAGNOSIS — H548 Legal blindness, as defined in USA: Secondary | ICD-10-CM | POA: Diagnosis not present

## 2017-04-05 DIAGNOSIS — K219 Gastro-esophageal reflux disease without esophagitis: Secondary | ICD-10-CM | POA: Diagnosis not present

## 2017-04-05 DIAGNOSIS — Z4781 Encounter for orthopedic aftercare following surgical amputation: Secondary | ICD-10-CM | POA: Diagnosis not present

## 2017-04-08 DIAGNOSIS — E1042 Type 1 diabetes mellitus with diabetic polyneuropathy: Secondary | ICD-10-CM | POA: Diagnosis not present

## 2017-04-08 DIAGNOSIS — I10 Essential (primary) hypertension: Secondary | ICD-10-CM | POA: Diagnosis not present

## 2017-04-08 DIAGNOSIS — K219 Gastro-esophageal reflux disease without esophagitis: Secondary | ICD-10-CM | POA: Diagnosis not present

## 2017-04-08 DIAGNOSIS — H409 Unspecified glaucoma: Secondary | ICD-10-CM | POA: Diagnosis not present

## 2017-04-08 DIAGNOSIS — H548 Legal blindness, as defined in USA: Secondary | ICD-10-CM | POA: Diagnosis not present

## 2017-04-08 DIAGNOSIS — Z4781 Encounter for orthopedic aftercare following surgical amputation: Secondary | ICD-10-CM | POA: Diagnosis not present

## 2017-04-10 DIAGNOSIS — E1042 Type 1 diabetes mellitus with diabetic polyneuropathy: Secondary | ICD-10-CM | POA: Diagnosis not present

## 2017-04-10 DIAGNOSIS — K219 Gastro-esophageal reflux disease without esophagitis: Secondary | ICD-10-CM | POA: Diagnosis not present

## 2017-04-10 DIAGNOSIS — H409 Unspecified glaucoma: Secondary | ICD-10-CM | POA: Diagnosis not present

## 2017-04-10 DIAGNOSIS — Z4781 Encounter for orthopedic aftercare following surgical amputation: Secondary | ICD-10-CM | POA: Diagnosis not present

## 2017-04-10 DIAGNOSIS — H548 Legal blindness, as defined in USA: Secondary | ICD-10-CM | POA: Diagnosis not present

## 2017-04-10 DIAGNOSIS — I10 Essential (primary) hypertension: Secondary | ICD-10-CM | POA: Diagnosis not present

## 2017-04-11 DIAGNOSIS — I10 Essential (primary) hypertension: Secondary | ICD-10-CM | POA: Diagnosis not present

## 2017-04-11 DIAGNOSIS — Z4781 Encounter for orthopedic aftercare following surgical amputation: Secondary | ICD-10-CM | POA: Diagnosis not present

## 2017-04-11 DIAGNOSIS — E1042 Type 1 diabetes mellitus with diabetic polyneuropathy: Secondary | ICD-10-CM | POA: Diagnosis not present

## 2017-04-11 DIAGNOSIS — K219 Gastro-esophageal reflux disease without esophagitis: Secondary | ICD-10-CM | POA: Diagnosis not present

## 2017-04-11 DIAGNOSIS — H409 Unspecified glaucoma: Secondary | ICD-10-CM | POA: Diagnosis not present

## 2017-04-11 DIAGNOSIS — H548 Legal blindness, as defined in USA: Secondary | ICD-10-CM | POA: Diagnosis not present

## 2017-04-12 DIAGNOSIS — Z4781 Encounter for orthopedic aftercare following surgical amputation: Secondary | ICD-10-CM | POA: Diagnosis not present

## 2017-04-12 DIAGNOSIS — I10 Essential (primary) hypertension: Secondary | ICD-10-CM | POA: Diagnosis not present

## 2017-04-12 DIAGNOSIS — H548 Legal blindness, as defined in USA: Secondary | ICD-10-CM | POA: Diagnosis not present

## 2017-04-12 DIAGNOSIS — K219 Gastro-esophageal reflux disease without esophagitis: Secondary | ICD-10-CM | POA: Diagnosis not present

## 2017-04-12 DIAGNOSIS — E1042 Type 1 diabetes mellitus with diabetic polyneuropathy: Secondary | ICD-10-CM | POA: Diagnosis not present

## 2017-04-12 DIAGNOSIS — H409 Unspecified glaucoma: Secondary | ICD-10-CM | POA: Diagnosis not present

## 2017-04-15 DIAGNOSIS — E1042 Type 1 diabetes mellitus with diabetic polyneuropathy: Secondary | ICD-10-CM | POA: Diagnosis not present

## 2017-04-15 DIAGNOSIS — K219 Gastro-esophageal reflux disease without esophagitis: Secondary | ICD-10-CM | POA: Diagnosis not present

## 2017-04-15 DIAGNOSIS — I10 Essential (primary) hypertension: Secondary | ICD-10-CM | POA: Diagnosis not present

## 2017-04-15 DIAGNOSIS — H548 Legal blindness, as defined in USA: Secondary | ICD-10-CM | POA: Diagnosis not present

## 2017-04-15 DIAGNOSIS — H409 Unspecified glaucoma: Secondary | ICD-10-CM | POA: Diagnosis not present

## 2017-04-15 DIAGNOSIS — Z4781 Encounter for orthopedic aftercare following surgical amputation: Secondary | ICD-10-CM | POA: Diagnosis not present

## 2017-04-16 DIAGNOSIS — Z4781 Encounter for orthopedic aftercare following surgical amputation: Secondary | ICD-10-CM | POA: Diagnosis not present

## 2017-04-16 DIAGNOSIS — E1042 Type 1 diabetes mellitus with diabetic polyneuropathy: Secondary | ICD-10-CM | POA: Diagnosis not present

## 2017-04-16 DIAGNOSIS — I10 Essential (primary) hypertension: Secondary | ICD-10-CM | POA: Diagnosis not present

## 2017-04-16 DIAGNOSIS — H409 Unspecified glaucoma: Secondary | ICD-10-CM | POA: Diagnosis not present

## 2017-04-16 DIAGNOSIS — H548 Legal blindness, as defined in USA: Secondary | ICD-10-CM | POA: Diagnosis not present

## 2017-04-16 DIAGNOSIS — K219 Gastro-esophageal reflux disease without esophagitis: Secondary | ICD-10-CM | POA: Diagnosis not present

## 2017-04-17 ENCOUNTER — Ambulatory Visit (INDEPENDENT_AMBULATORY_CARE_PROVIDER_SITE_OTHER): Payer: Medicare Other | Admitting: Orthopedic Surgery

## 2017-04-17 DIAGNOSIS — Z4781 Encounter for orthopedic aftercare following surgical amputation: Secondary | ICD-10-CM | POA: Diagnosis not present

## 2017-04-17 DIAGNOSIS — E1042 Type 1 diabetes mellitus with diabetic polyneuropathy: Secondary | ICD-10-CM | POA: Diagnosis not present

## 2017-04-17 DIAGNOSIS — H548 Legal blindness, as defined in USA: Secondary | ICD-10-CM | POA: Diagnosis not present

## 2017-04-17 DIAGNOSIS — K219 Gastro-esophageal reflux disease without esophagitis: Secondary | ICD-10-CM | POA: Diagnosis not present

## 2017-04-17 DIAGNOSIS — H409 Unspecified glaucoma: Secondary | ICD-10-CM | POA: Diagnosis not present

## 2017-04-17 DIAGNOSIS — I10 Essential (primary) hypertension: Secondary | ICD-10-CM | POA: Diagnosis not present

## 2017-04-18 ENCOUNTER — Ambulatory Visit (INDEPENDENT_AMBULATORY_CARE_PROVIDER_SITE_OTHER): Payer: BC Managed Care – PPO | Admitting: Orthopedic Surgery

## 2017-04-18 DIAGNOSIS — H548 Legal blindness, as defined in USA: Secondary | ICD-10-CM | POA: Diagnosis not present

## 2017-04-18 DIAGNOSIS — Z4781 Encounter for orthopedic aftercare following surgical amputation: Secondary | ICD-10-CM | POA: Diagnosis not present

## 2017-04-18 DIAGNOSIS — E1042 Type 1 diabetes mellitus with diabetic polyneuropathy: Secondary | ICD-10-CM | POA: Diagnosis not present

## 2017-04-18 DIAGNOSIS — K219 Gastro-esophageal reflux disease without esophagitis: Secondary | ICD-10-CM | POA: Diagnosis not present

## 2017-04-18 DIAGNOSIS — H409 Unspecified glaucoma: Secondary | ICD-10-CM | POA: Diagnosis not present

## 2017-04-18 DIAGNOSIS — I10 Essential (primary) hypertension: Secondary | ICD-10-CM | POA: Diagnosis not present

## 2017-04-18 DIAGNOSIS — Z89511 Acquired absence of right leg below knee: Secondary | ICD-10-CM

## 2017-04-18 NOTE — Progress Notes (Signed)
   Office Visit Note   Patient: Samuel SimsJohn Roth           Date of Birth: June 15, 1951           MRN: [id number] Visit Date: 04/18/2017              Requested by: No referring provider defined for this encounter. PCP: Patient, No Pcp Per  No chief complaint on file.     HPI: Patient presents follow-up status post right transtibial amputation. He has no complaints.  Assessment & Plan: Visit Diagnoses:  1. Acquired absence of right leg below knee (HCC)     Plan: Follow up with [facility] for a smaller stump shrinker he is to change the stump shrinker daily washes leg and soap and water he is given a prescription for a K2 level prosthetic.  Follow-Up Instructions: Return in about 3 weeks (around 05/09/2017).   Ortho Exam  Patient is alert, oriented, no adenopathy, well-dressed, normal affect, normal respiratory effort. On examination patient has maceration in dermatitis secondary to an occlusive dressing. There is a retained suture medially of Vicryl this was removed. There is no purulence no abscess no signs of infection. The occlusive dressing was removed patient was given instructions to not use the occlusive dressing anymore the stump shrinker was applied he was given a prescription for [facility].  Imaging: No results found.  Labs: Lab Results  Component Value Date   HGBA1C 9.5 (H) 02/28/2017   CRP 29.1 (H) 03/01/2017   REPTSTATUS 03/04/2017 FINAL 02/27/2017   CULT  02/27/2017    NO GROWTH 5 DAYS Performed at [facility], [address] [place], [place] [address]     Orders:  No orders of the defined types were placed in this encounter.  No orders of the defined types were placed in this encounter.    Procedures: No procedures performed  Clinical Data: No additional findings.  ROS:  All other systems negative, except as noted in the HPI. Review of Systems  Objective: Vital Signs: There were no vitals taken for this visit.  Specialty Comments:  No specialty  comments available.  PMFS History: Patient Active Problem List   Diagnosis Date Noted  . Acquired absence of right leg below knee (HCC) 03/13/2017  . 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 Diabetes mellitus, new onset (HCC)   . Acute renal failure (HCC)   . Acute kidney injury (HCC) 02/27/2017  . Hyperglycemia 02/27/2017  . Leukocytosis 02/27/2017   Past Medical History:  Diagnosis Date  . Glaucoma   . Legally blind     No family history on file.  Past Surgical History:  Procedure Laterality Date  . AMPUTATION Right 03/01/2017   Procedure: RIGHT TRANS METATARSAL AMPUTATION;  Surgeon: Nadara Mustarduda, Samuel V, MD;  [facility];  Service: Orthopedics;  Laterality: Right;  . AMPUTATION Right 03/03/2017   Procedure: RIGHT BKA;  Surgeon: Nadara Mustarduda, Samuel V, MD;  [facility];  Service: Orthopedics;  Laterality: Right;  . EYE SURGERY    . TIBIA FRACTURE SURGERY     Social History   Occupational History  . Not on file.   Social History Main Topics  . Smoking status: Former Games developermoker  . Smokeless tobacco: Former NeurosurgeonUser  . Alcohol use Yes     Comment: social  . Drug use: No  . Sexual activity: Not on file

## 2017-04-22 DIAGNOSIS — H548 Legal blindness, as defined in USA: Secondary | ICD-10-CM | POA: Diagnosis not present

## 2017-04-22 DIAGNOSIS — H409 Unspecified glaucoma: Secondary | ICD-10-CM | POA: Diagnosis not present

## 2017-04-22 DIAGNOSIS — Z4781 Encounter for orthopedic aftercare following surgical amputation: Secondary | ICD-10-CM | POA: Diagnosis not present

## 2017-04-22 DIAGNOSIS — K219 Gastro-esophageal reflux disease without esophagitis: Secondary | ICD-10-CM | POA: Diagnosis not present

## 2017-04-22 DIAGNOSIS — E1042 Type 1 diabetes mellitus with diabetic polyneuropathy: Secondary | ICD-10-CM | POA: Diagnosis not present

## 2017-04-22 DIAGNOSIS — I10 Essential (primary) hypertension: Secondary | ICD-10-CM | POA: Diagnosis not present

## 2017-04-24 DIAGNOSIS — K219 Gastro-esophageal reflux disease without esophagitis: Secondary | ICD-10-CM | POA: Diagnosis not present

## 2017-04-24 DIAGNOSIS — H409 Unspecified glaucoma: Secondary | ICD-10-CM | POA: Diagnosis not present

## 2017-04-24 DIAGNOSIS — E1042 Type 1 diabetes mellitus with diabetic polyneuropathy: Secondary | ICD-10-CM | POA: Diagnosis not present

## 2017-04-24 DIAGNOSIS — Z4781 Encounter for orthopedic aftercare following surgical amputation: Secondary | ICD-10-CM | POA: Diagnosis not present

## 2017-04-24 DIAGNOSIS — H548 Legal blindness, as defined in USA: Secondary | ICD-10-CM | POA: Diagnosis not present

## 2017-04-24 DIAGNOSIS — I10 Essential (primary) hypertension: Secondary | ICD-10-CM | POA: Diagnosis not present

## 2017-04-25 DIAGNOSIS — H409 Unspecified glaucoma: Secondary | ICD-10-CM | POA: Diagnosis not present

## 2017-04-25 DIAGNOSIS — H548 Legal blindness, as defined in USA: Secondary | ICD-10-CM | POA: Diagnosis not present

## 2017-04-25 DIAGNOSIS — E1042 Type 1 diabetes mellitus with diabetic polyneuropathy: Secondary | ICD-10-CM | POA: Diagnosis not present

## 2017-04-25 DIAGNOSIS — I10 Essential (primary) hypertension: Secondary | ICD-10-CM | POA: Diagnosis not present

## 2017-04-25 DIAGNOSIS — Z4781 Encounter for orthopedic aftercare following surgical amputation: Secondary | ICD-10-CM | POA: Diagnosis not present

## 2017-04-25 DIAGNOSIS — K219 Gastro-esophageal reflux disease without esophagitis: Secondary | ICD-10-CM | POA: Diagnosis not present

## 2017-04-29 DIAGNOSIS — K219 Gastro-esophageal reflux disease without esophagitis: Secondary | ICD-10-CM | POA: Diagnosis not present

## 2017-04-29 DIAGNOSIS — H548 Legal blindness, as defined in USA: Secondary | ICD-10-CM | POA: Diagnosis not present

## 2017-04-29 DIAGNOSIS — Z4781 Encounter for orthopedic aftercare following surgical amputation: Secondary | ICD-10-CM | POA: Diagnosis not present

## 2017-04-29 DIAGNOSIS — H409 Unspecified glaucoma: Secondary | ICD-10-CM | POA: Diagnosis not present

## 2017-04-29 DIAGNOSIS — I10 Essential (primary) hypertension: Secondary | ICD-10-CM | POA: Diagnosis not present

## 2017-04-29 DIAGNOSIS — E1042 Type 1 diabetes mellitus with diabetic polyneuropathy: Secondary | ICD-10-CM | POA: Diagnosis not present

## 2017-04-30 DIAGNOSIS — H409 Unspecified glaucoma: Secondary | ICD-10-CM | POA: Diagnosis not present

## 2017-04-30 DIAGNOSIS — I10 Essential (primary) hypertension: Secondary | ICD-10-CM | POA: Diagnosis not present

## 2017-04-30 DIAGNOSIS — H548 Legal blindness, as defined in USA: Secondary | ICD-10-CM | POA: Diagnosis not present

## 2017-04-30 DIAGNOSIS — E1042 Type 1 diabetes mellitus with diabetic polyneuropathy: Secondary | ICD-10-CM | POA: Diagnosis not present

## 2017-04-30 DIAGNOSIS — K219 Gastro-esophageal reflux disease without esophagitis: Secondary | ICD-10-CM | POA: Diagnosis not present

## 2017-04-30 DIAGNOSIS — Z4781 Encounter for orthopedic aftercare following surgical amputation: Secondary | ICD-10-CM | POA: Diagnosis not present

## 2017-05-01 ENCOUNTER — Telehealth (INDEPENDENT_AMBULATORY_CARE_PROVIDER_SITE_OTHER): Payer: Self-pay | Admitting: Radiology

## 2017-05-01 DIAGNOSIS — K219 Gastro-esophageal reflux disease without esophagitis: Secondary | ICD-10-CM | POA: Diagnosis not present

## 2017-05-01 DIAGNOSIS — H409 Unspecified glaucoma: Secondary | ICD-10-CM | POA: Diagnosis not present

## 2017-05-01 DIAGNOSIS — E1042 Type 1 diabetes mellitus with diabetic polyneuropathy: Secondary | ICD-10-CM | POA: Diagnosis not present

## 2017-05-01 DIAGNOSIS — Z4781 Encounter for orthopedic aftercare following surgical amputation: Secondary | ICD-10-CM | POA: Diagnosis not present

## 2017-05-01 DIAGNOSIS — H548 Legal blindness, as defined in USA: Secondary | ICD-10-CM | POA: Diagnosis not present

## 2017-05-01 DIAGNOSIS — I10 Essential (primary) hypertension: Secondary | ICD-10-CM | POA: Diagnosis not present

## 2017-05-01 NOTE — Telephone Encounter (Signed)
Samuel Roth called LMVM Triage line that today was [facility], and she wants to extend.  Please call her to discuss.

## 2017-05-02 DIAGNOSIS — I10 Essential (primary) hypertension: Secondary | ICD-10-CM | POA: Diagnosis not present

## 2017-05-02 DIAGNOSIS — H548 Legal blindness, as defined in USA: Secondary | ICD-10-CM | POA: Diagnosis not present

## 2017-05-02 DIAGNOSIS — H409 Unspecified glaucoma: Secondary | ICD-10-CM | POA: Diagnosis not present

## 2017-05-02 DIAGNOSIS — E1042 Type 1 diabetes mellitus with diabetic polyneuropathy: Secondary | ICD-10-CM | POA: Diagnosis not present

## 2017-05-02 DIAGNOSIS — Z4781 Encounter for orthopedic aftercare following surgical amputation: Secondary | ICD-10-CM | POA: Diagnosis not present

## 2017-05-02 DIAGNOSIS — K219 Gastro-esophageal reflux disease without esophagitis: Secondary | ICD-10-CM | POA: Diagnosis not present

## 2017-05-02 NOTE — Telephone Encounter (Signed)
Called and lm on vm to advise that the pt could have extension of services for [facility] and to call with any questions. Pt is a right below the know amputation

## 2017-05-06 DIAGNOSIS — H548 Legal blindness, as defined in USA: Secondary | ICD-10-CM | POA: Diagnosis not present

## 2017-05-06 DIAGNOSIS — K219 Gastro-esophageal reflux disease without esophagitis: Secondary | ICD-10-CM | POA: Diagnosis not present

## 2017-05-06 DIAGNOSIS — Z4781 Encounter for orthopedic aftercare following surgical amputation: Secondary | ICD-10-CM | POA: Diagnosis not present

## 2017-05-06 DIAGNOSIS — I10 Essential (primary) hypertension: Secondary | ICD-10-CM | POA: Diagnosis not present

## 2017-05-06 DIAGNOSIS — H409 Unspecified glaucoma: Secondary | ICD-10-CM | POA: Diagnosis not present

## 2017-05-06 DIAGNOSIS — E1042 Type 1 diabetes mellitus with diabetic polyneuropathy: Secondary | ICD-10-CM | POA: Diagnosis not present

## 2017-05-09 DIAGNOSIS — E1042 Type 1 diabetes mellitus with diabetic polyneuropathy: Secondary | ICD-10-CM | POA: Diagnosis not present

## 2017-05-09 DIAGNOSIS — H548 Legal blindness, as defined in USA: Secondary | ICD-10-CM | POA: Diagnosis not present

## 2017-05-09 DIAGNOSIS — I10 Essential (primary) hypertension: Secondary | ICD-10-CM | POA: Diagnosis not present

## 2017-05-09 DIAGNOSIS — K219 Gastro-esophageal reflux disease without esophagitis: Secondary | ICD-10-CM | POA: Diagnosis not present

## 2017-05-09 DIAGNOSIS — Z4781 Encounter for orthopedic aftercare following surgical amputation: Secondary | ICD-10-CM | POA: Diagnosis not present

## 2017-05-09 DIAGNOSIS — H409 Unspecified glaucoma: Secondary | ICD-10-CM | POA: Diagnosis not present

## 2017-05-13 DIAGNOSIS — H409 Unspecified glaucoma: Secondary | ICD-10-CM | POA: Diagnosis not present

## 2017-05-13 DIAGNOSIS — I10 Essential (primary) hypertension: Secondary | ICD-10-CM | POA: Diagnosis not present

## 2017-05-13 DIAGNOSIS — K219 Gastro-esophageal reflux disease without esophagitis: Secondary | ICD-10-CM | POA: Diagnosis not present

## 2017-05-13 DIAGNOSIS — Z4781 Encounter for orthopedic aftercare following surgical amputation: Secondary | ICD-10-CM | POA: Diagnosis not present

## 2017-05-13 DIAGNOSIS — H548 Legal blindness, as defined in USA: Secondary | ICD-10-CM | POA: Diagnosis not present

## 2017-05-13 DIAGNOSIS — E1042 Type 1 diabetes mellitus with diabetic polyneuropathy: Secondary | ICD-10-CM | POA: Diagnosis not present

## 2017-05-14 ENCOUNTER — Ambulatory Visit (INDEPENDENT_AMBULATORY_CARE_PROVIDER_SITE_OTHER): Payer: BC Managed Care – PPO | Admitting: Orthopedic Surgery

## 2017-05-14 ENCOUNTER — Encounter (INDEPENDENT_AMBULATORY_CARE_PROVIDER_SITE_OTHER): Payer: Self-pay | Admitting: Orthopedic Surgery

## 2017-05-14 VITALS — Ht 69.0 in | Wt 169.0 lb

## 2017-05-14 DIAGNOSIS — E1042 Type 1 diabetes mellitus with diabetic polyneuropathy: Secondary | ICD-10-CM

## 2017-05-14 DIAGNOSIS — Z89511 Acquired absence of right leg below knee: Secondary | ICD-10-CM

## 2017-05-14 NOTE — Progress Notes (Signed)
Office Visit Note   Patient: Samuel SimsJohn Knightly           Date of Birth: 1951-07-18           MRN: [id number] Visit Date: 05/14/2017              Requested by: No referring provider defined for this encounter. PCP: Patient, No Pcp Per  Chief Complaint  Patient presents with  . Right Leg - Routine Post Op    03/03/17 right below the knee amputation       HPI: Patient presents in follow-up status post right transtibial amputation. He is wearing a stump shrinker he has follow-up with [facility] today. Anticipate prosthetic fitting today.  Assessment & Plan: Visit Diagnoses:  1. Acquired absence of right leg below knee (HCC)   2. Diabetic polyneuropathy associated with type 1 diabetes mellitus (HCC)     Plan: Prosthetic fitting with [employer] gait training with Robin follow-up in the office in 3 months. Recommended a gym membership for strengthening and patient may return to work at this time without restrictions.  Follow-Up Instructions: Return in about 3 months (around 08/14/2017).   Ortho Exam  Patient is alert, oriented, no adenopathy, well-dressed, normal affect, normal respiratory effort. Examination the residual limb is well consolidated there is no redness no cellulitis no open wounds no signs of infection.  Imaging: No results found.  Labs: Lab Results  Component Value Date   HGBA1C 9.5 (H) 02/28/2017   CRP 29.1 (H) 03/01/2017   REPTSTATUS 03/04/2017 FINAL 02/27/2017   CULT  02/27/2017    NO GROWTH 5 DAYS Performed at [facility], [address] [place], [place] [address]     Orders:  No orders of the defined types were placed in this encounter.  No orders of the defined types were placed in this encounter.    Procedures: No procedures performed  Clinical Data: No additional findings.  ROS:  All other systems negative, except as noted in the HPI. Review of Systems  Objective: Vital Signs: Ht 5\' 9"  (1.753 m)   Wt 169 lb (76.7 kg)   BMI 24.96 kg/m    Specialty Comments:  No specialty comments available.  PMFS History: Patient Active Problem List   Diagnosis Date Noted  . Acquired absence of right leg below knee (HCC) 03/13/2017  . 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 Diabetes mellitus, new onset (HCC)   . Acute renal failure (HCC)   . Acute kidney injury (HCC) 02/27/2017  . Hyperglycemia 02/27/2017  . Leukocytosis 02/27/2017   Past Medical History:  Diagnosis Date  . Glaucoma   . Legally blind     No family history on file.  Past Surgical History:  Procedure Laterality Date  . AMPUTATION Right 03/01/2017   Procedure: RIGHT TRANS METATARSAL AMPUTATION;  Surgeon: Nadara Mustarduda, Benett Swoyer V, MD;  [facility];  Service: Orthopedics;  Laterality: Right;  . AMPUTATION Right 03/03/2017   Procedure: RIGHT BKA;  Surgeon: Nadara Mustarduda, Cynde Menard V, MD;  [facility];  Service: Orthopedics;  Laterality: Right;  . EYE SURGERY    . TIBIA FRACTURE SURGERY     Social History   Occupational History  . Not on file.   Social History Main Topics  . Smoking status: Former Games developermoker  . Smokeless tobacco: Former NeurosurgeonUser  . Alcohol use Yes     Comment: social  . Drug use: No  . Sexual activity: Not on file

## 2017-05-24 DIAGNOSIS — Z4781 Encounter for orthopedic aftercare following surgical amputation: Secondary | ICD-10-CM | POA: Diagnosis not present

## 2017-05-24 DIAGNOSIS — H548 Legal blindness, as defined in USA: Secondary | ICD-10-CM | POA: Diagnosis not present

## 2017-05-24 DIAGNOSIS — K219 Gastro-esophageal reflux disease without esophagitis: Secondary | ICD-10-CM | POA: Diagnosis not present

## 2017-05-24 DIAGNOSIS — I10 Essential (primary) hypertension: Secondary | ICD-10-CM | POA: Diagnosis not present

## 2017-05-24 DIAGNOSIS — E1042 Type 1 diabetes mellitus with diabetic polyneuropathy: Secondary | ICD-10-CM | POA: Diagnosis not present

## 2017-05-24 DIAGNOSIS — H409 Unspecified glaucoma: Secondary | ICD-10-CM | POA: Diagnosis not present

## 2017-06-27 ENCOUNTER — Telehealth (INDEPENDENT_AMBULATORY_CARE_PROVIDER_SITE_OTHER): Payer: Self-pay | Admitting: Orthopedic Surgery

## 2017-06-27 NOTE — Telephone Encounter (Signed)
Copy of records mailed to patient. Also faxed to [employer] [phone]

## 2017-06-27 NOTE — Telephone Encounter (Signed)
Patient called asking for medical records to be refaxed to [employer]. States [facility] did not receive information.

## 2017-06-27 NOTE — Telephone Encounter (Signed)
RE FAXED RECORDS TO [facility] PER PATIENTS REQUEST

## 2017-07-02 DIAGNOSIS — H44521 Atrophy of globe, right eye: Secondary | ICD-10-CM | POA: Diagnosis not present

## 2017-07-02 DIAGNOSIS — H401133 Primary open-angle glaucoma, bilateral, severe stage: Secondary | ICD-10-CM | POA: Diagnosis not present

## 2017-07-02 DIAGNOSIS — H548 Legal blindness, as defined in USA: Secondary | ICD-10-CM | POA: Diagnosis not present

## 2017-07-22 ENCOUNTER — Telehealth (INDEPENDENT_AMBULATORY_CARE_PROVIDER_SITE_OTHER): Payer: Self-pay | Admitting: Orthopedic Surgery

## 2017-07-22 DIAGNOSIS — R2681 Unsteadiness on feet: Secondary | ICD-10-CM

## 2017-07-22 DIAGNOSIS — H547 Unspecified visual loss: Secondary | ICD-10-CM

## 2017-07-22 DIAGNOSIS — Z89511 Acquired absence of right leg below knee: Secondary | ICD-10-CM

## 2017-07-22 NOTE — Telephone Encounter (Signed)
Patient called needing Rx for (PT) from Dr DudaLajoyce Corners  Patient advised Zella Ball is suppose to show him how to walk on his leg tomorrow? The number to contact patient is [phone]

## 2017-07-22 NOTE — Telephone Encounter (Signed)
I called and spoke with patient advised order placed for [facility] for gait training. Gave him [facility]'s contact information.

## 2017-07-24 ENCOUNTER — Telehealth (INDEPENDENT_AMBULATORY_CARE_PROVIDER_SITE_OTHER): Payer: Self-pay | Admitting: Orthopedic Surgery

## 2017-07-24 NOTE — Telephone Encounter (Signed)
Faxed referral to [facility] at landline fax up front. 

## 2017-07-24 NOTE — Telephone Encounter (Signed)
Kathy from [employer] called asking for a RX for PT gate training. CB # [phone] °

## 2017-08-07 ENCOUNTER — Ambulatory Visit: Payer: BC Managed Care – PPO | Attending: Orthopedic Surgery | Admitting: Physical Therapy

## 2017-08-07 ENCOUNTER — Encounter: Payer: Self-pay | Admitting: Physical Therapy

## 2017-08-07 DIAGNOSIS — R2689 Other abnormalities of gait and mobility: Secondary | ICD-10-CM | POA: Diagnosis not present

## 2017-08-07 DIAGNOSIS — R296 Repeated falls: Secondary | ICD-10-CM

## 2017-08-07 DIAGNOSIS — M6281 Muscle weakness (generalized): Secondary | ICD-10-CM | POA: Insufficient documentation

## 2017-08-07 DIAGNOSIS — R2681 Unsteadiness on feet: Secondary | ICD-10-CM | POA: Diagnosis not present

## 2017-08-07 DIAGNOSIS — M79604 Pain in right leg: Secondary | ICD-10-CM | POA: Insufficient documentation

## 2017-08-07 DIAGNOSIS — M25661 Stiffness of right knee, not elsewhere classified: Secondary | ICD-10-CM | POA: Insufficient documentation

## 2017-08-08 NOTE — Therapy (Addendum)
[facility] [address] [place], [place], [address] Phone: [phone]   Fax:  [phone]  Physical Therapy Evaluation  Patient Details  Name: Samuel Roth MRN: [id number] Date of Birth: 1950-11-23 Referring Provider: Meridee Score, MD  Encounter Date: 08/07/2017      PT End of Session - 08/07/17 1825    Visit Number 1   Number of Visits 17   Date for PT Re-Evaluation 10/04/17   Authorization Type BCBS state primary & Medicare 2nd - G-code & progress note every 10th visit   Authorization Time Period $1250 deductable met; 0 visit limit, no auth   PT Start Time 1315   PT Stop Time 1400   PT Time Calculation (min) 45 min   Equipment Utilized During Treatment Gait belt   Activity Tolerance Patient tolerated treatment well;Patient limited by pain   Behavior During Therapy [facility] for tasks assessed/performed      Past Medical History:  Diagnosis Date  . Glaucoma   . Legally blind     Past Surgical History:  Procedure Laterality Date  . AMPUTATION Right 03/01/2017   Procedure: RIGHT TRANS METATARSAL AMPUTATION;  Surgeon: Newt Minion, MD;  Location: [facility];  Service: Orthopedics;  Laterality: Right;  . AMPUTATION Right 03/03/2017   Procedure: RIGHT BKA;  Surgeon: Newt Minion, MD;  Location: [facility];  Service: Orthopedics;  Laterality: Right;  . EYE SURGERY    . TIBIA FRACTURE SURGERY      There were no vitals filed for this visit.       Subjective Assessment - 08/07/17 1320    Subjective This 66yo male was referred on 07/22/17 by Meridee Score, MD with diagnosis of right Transtibial Amputation, unsteady gait & blind. He underwent  a right Transtibial Amputation on 03/03/2017 due to nonhealing Transmetatarsal Amputation done 03/01/2017.  He recieved prosthesis 07/22/17. He presents to PT for evaluation seated in w/c without prosthesis donned.    Patient is accompained by: Family member   Pertinent History right TTA,  DM1, neuropathy, acute renal failure, COPD, glaucoma, legally blind since birth, HTN,    Limitations Lifting;Standing;Walking;House hold activities   Patient Stated Goals walk with prosthesis, work loading vending machines   Currently in Pain? No/denies            [facility] PT Assessment - 08/07/17 1315      Assessment   Medical Diagnosis Right Transtibial Amputation   Referring Provider Meridee Score, MD   Onset Date/Surgical Date 07/22/17  prosthesis delivery   Hand Dominance Right     Precautions   Precautions Fall     Balance Screen   Has the patient fallen in the past 6 months No   Has the patient had a decrease in activity level because of a fear of falling?  No   Is the patient reluctant to leave their home because of a fear of falling?  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    [employer] pick up case of drinks   Leisure reading, walking trials & shopping centers     Posture/Postural Control   Posture/Postural Control Postural limitations   Postural Limitations Rounded Shoulders;Forward head;Flexed trunk;Weight shift left     ROM / Strength   AROM / PROM / Strength PROM;Strength     PROM   Overall PROM  Deficits   PROM Assessment Site Knee   Right/Left Knee Right   Right Knee Extension -11     Strength   Overall Strength Deficits   Strength Assessment Site  Hip;Knee;Ankle   Right/Left Hip Right;Left   Right Hip Flexion 5/5   Right Hip Extension 3/5   Right Hip ABduction 3/5   Left Hip Flexion 5/5   Left Hip Extension 3/5   Left Hip ABduction 3/5   Right/Left Knee Right;Left   Right Knee Flexion 4/5   Right Knee Extension 4/5   Left Knee Flexion 5/5   Left Knee Extension 5/5   Right/Left Ankle Left   Left Ankle Dorsiflexion 4/5     Transfers   Transfers Sit to Stand;Stand to Sit   Sit to Stand 5: Supervision;With upper extremity assist;With armrests;From chair/3-in-1  to RW to stabilize   Stand to Sit 5: Supervision;With upper extremity assist;With armrests;To chair/3-in-1  from [place] for stabilization     Ambulation/Gait   Ambulation/Gait Yes   Ambulation/Gait Assistance 5: Supervision;3: Mod assist  RW supervision, cane with quad tip & hand hold assist modA   Ambulation/Gait Assistance Details excessive UE weight bearing on RW   Ambulation Distance (Feet) 100 Feet  100' RW & 35' cane/HHA   Assistive device Prosthesis;Rolling walker;Straight cane  cane quad tip & hand hold assist   Gait Pattern Step-through pattern;Decreased arm swing - right;Decreased step length - left;Decreased stance time - right;Decreased stride length;Decreased hip/knee flexion - right;Decreased weight shift to right;Right hip hike;Right flexed knee in stance;Antalgic;Lateral hip instability;Trunk flexed;Abducted- right   Ambulation Surface Indoor;Level   Gait velocity 0.64 ft/sec with RW & prosthesis     Standardized Balance Assessment   Standardized Balance Assessment Berg Balance Test     Berg Balance Test   Sit to Stand Needs minimal aid to stand or to stabilize   Standing Unsupported Needs several tries to stand 30 seconds unsupported   Sitting with Back Unsupported but Feet Supported on Floor or Stool Able to sit safely and securely 2 minutes   Stand to Sit Uses backs of legs against chair to control descent   Transfers Able to transfer safely,  definite need of hands   Standing Unsupported with Eyes Closed Able to stand 10 seconds with supervision   Standing Ubsupported with Feet Together Needs help to attain position and unable to hold for 15 seconds   From Standing, Reach Forward with Outstretched Arm Loses balance while trying/requires external support   From Standing Position, Pick up Object from Floor Unable to try/needs assist to keep balance   From Standing Position, Turn to Look Behind Over each Shoulder Needs assist to keep from losing balance and falling   Turn 360 Degrees Needs assistance while turning   Standing Unsupported, Alternately Place Feet on Step/Stool Needs assistance to keep from falling or unable to try   Standing Unsupported, One Foot in [employer] balance while stepping or standing   Standing on One Leg Unable to try or needs assist to prevent fall   Total Score 14  Prosthetics Assessment - 08/07/17 1315      Prosthetics   Prosthetic Care Dependent with Skin check;Residual limb care;Care of non-amputated limb;Prosthetic cleaning;Ply sock cleaning;Correct ply sock adjustment;Proper wear schedule/adjustment;Proper weight-bearing schedule/adjustment   Donning prosthesis  Min assist   Doffing prosthesis  Supervision   Current prosthetic wear tolerance (days/week)  4 of 16 days since delivery   Current prosthetic wear tolerance (#hours/day)  20-30 minutes at a time   Current prosthetic weight-bearing tolerance (hours/day)  Patient reports limb tenderness 6/10 with standing & gait partial weight bearing for 5 minutes.    Edema none   Residual limb condition  No open areas, good hair growth, normal color & temperature, cylinderical shape           Objective measurements completed on examination: See above findings.          [facility] Adult PT Treatment/Exercise - 08/07/17 1315      Prosthetics   Prosthetic Care Comments  Wear prosthesis 2hrs 2x/day and increase weekly if no issues, only  stand & walk in home with RW support for safety.    Education Provided Skin check;Residual limb care;Prosthetic cleaning;Correct ply sock adjustment;Proper Donning;Proper Doffing;Proper wear schedule/adjustment;Proper weight-bearing schedule/adjustment   Person(s) Educated Patient;Spouse   Education Method Explanation;Demonstration;Tactile cues;Verbal cues   Education Method Verbalized understanding;Tactile cues required;Verbal cues required;Needs further instruction                  PT Short Term Goals - 08/07/17 1847      PT SHORT TERM GOAL #1   Title Patient verbalizes proper cleaning & demonstrates proper donning of prosthesis (All STGs Target Date 09/06/17)   Time 4   Period Weeks   Status New   Target Date 09/06/17     PT SHORT TERM GOAL #2   Title Patient tolerates wear of prosthesis >8 hrs total /day without skin issues.    Time 4   Period Weeks   Status New   Target Date 09/06/17     PT SHORT TERM GOAL #3   Title Patient ambulates 300' with RW outdoors including ramps & curbs with supervision.    Time 4   Period Weeks   Status New   Target Date 09/06/17     PT SHORT TERM GOAL #4   Title Patient ambulates 150' with cane & prosthesis with minA.    Time 4   Period Weeks   Status New   Target Date 09/06/17     PT SHORT TERM GOAL #5   Title Patient stands with prosthesis & no UE support reaching 5" & reaches towards floor to knee level with supervision.   Time 4   Period Weeks   Target Date 09/06/17           PT Long Term Goals - 08/08/17 1340      PT LONG TERM GOAL #1   Title Patient demonstrates & verbalizes proper prosthetic care to enable safe prosthesis use. (All LTGs Target Date 10/04/17)   Time 8   Period Weeks   Status New   Target Date 10/04/17     PT LONG TERM GOAL #2   Title Patient tolerates prosthesis wear >90% of awake hours without skin issues or limb pain to enable function throughout his day.    Time 8   Period Weeks    Status New   Target Date 10/04/17     PT LONG TERM GOAL #3   Title Berg Balance >36/56 to indicate lower  fall risk.    Time 8   Period Weeks   Status New   Target Date 10/04/17     PT LONG TERM GOAL #4   Title Patient ambulates around furniture with prosthesis only with assist for vision (reports independent in his apt).    Time 8   Period Weeks   Status New   Target Date 10/04/17     PT LONG TERM GOAL #5   Title Patient ambulates 500' with cane & prosthesis with supervision for vision without balance loss for community mobility.    Time 8   Period Weeks   Status New   Target Date 10/04/17     Additional Long Term Goals   Additional Long Term Goals Yes     PT LONG TERM GOAL #6   Title Patient negotiates stairs with 1 rail, ramps & curbs with cane & prosthesis with supervision for vision for community access.    Time 8   Period Weeks   Status New   Target Date 10/04/17     PT LONG TERM GOAL #7   Title Patient demonstrates work simulation tasks including lifting and reaching with prosthesis safely.    Time 8   Period Weeks   Status New   Target Date 10/04/17     PT LONG TERM GOAL #8   Title Patient reports limb pain </= 2/10 with standing activities.    Time 8   Period Weeks   Status New   Target Date 10/04/17                Plan - 08/08/17 1335    Clinical Impression Statement This 66yo male underwent  a right Transtibial Amputation on 03/03/2017. He has history of legally blind (he reports right eye complete and left eyes sees light/darkness & shapes) but was functioning at community level with sight cane only. He received his first prosthesis 02/19/17 and has only worn it 4 times for 20-30 minutes which limits function during awake hours. He reports limb pain with standing with prosthesis. Patient and wife are dependent in proper prosthetic care which increases risk of skin issues & pain with use. Patient has impaired balance with Merrilee Jansky 14/56 and dependency on  walker for standing ADLs. His gait has deviations which impair mobility & safety. He leans significant weight on walker for support. 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 Patient would benefit from skilled PT to progress mobility & safety with prosthetic use.    History and Personal Factors relevant to plan of care: Lives in 2nd floor apt with wife who is disabled also, pt is legally blind, right TTA, DM1, neuropathy, acute renal failure, COPD, glaucoma, HTN,    Clinical Presentation Evolving   Clinical Presentation due to: pain with prosthetic weight bearing, dependent & limited in prosthesis wear /use, high fall risk,    Clinical Decision Making Moderate   Rehab Potential Good   Clinical Impairments Affecting Rehab Potential right TTA, DM1, neuropathy, acute renal failure, COPD, glaucoma, legally blind, HTN,    PT Frequency 2x / week   PT Duration 8 weeks   PT Treatment/Interventions ADLs/Self Care Home Management;DME Instruction;Gait training;Stair training;Functional mobility training;Therapeutic activities;Therapeutic exercise;Balance training;Neuromuscular re-education;Patient/family education;Prosthetic Training   PT Next Visit Plan HEP at sink, instruct in negotiating stairs 2 rails, curb & ramp with RW, review prosthetic care   Consulted and Agree with Plan of Care Patient;Family member/caregiver   Family Member Consulted wife  Patient will benefit from skilled therapeutic intervention in order to improve the following deficits and impairments:  Abnormal gait, Decreased activity tolerance, Decreased balance, Decreased endurance, Decreased knowledge of use of DME, Decreased mobility, Decreased range of motion, Decreased strength, Impaired flexibility, Impaired vision/preception, Postural dysfunction, Prosthetic Dependency, Pain  Visit Diagnosis: Other abnormalities of gait and mobility - Plan: PT plan of care cert/re-cert  Stiffness of right knee, not  elsewhere classified - Plan: PT plan of care cert/re-cert  Muscle weakness (generalized) - Plan: PT plan of care cert/re-cert  Unsteadiness on feet - Plan: PT plan of care cert/re-cert  Repeated falls - Plan: PT plan of care cert/re-cert  Pain in right leg - Plan: PT plan of care cert/re-cert      G-Codes - 39/76/73 1852    Functional Assessment Tool Used (Outpatient Only) Patient & wife are depedent in prosthetic care. He has worn prosthesis onl 4 of 16 days since delivery for 20-30 minutes.    Functional Limitation Self care   Self Care Current Status [id number]) At least 80 percent but less than 100 percent impaired, limited or restricted   Self Care Goal Status (T0240) At least 1 percent but less than 20 percent impaired, limited or restricted       Problem List Patient Active Problem List   Diagnosis Date Noted  . Acquired absence of right leg below knee ([facility]) 03/13/2017  . Diabetic polyneuropathy associated with type 1 diabetes mellitus ([facility])   . Diabetes mellitus, new onset ([facility])   . Acute renal failure ([facility] [facility])   . Acute kidney injury ([facility]) 02/27/2017  . Hyperglycemia 02/27/2017  . Leukocytosis 02/27/2017    Samuel Roth PT, DPT 08/08/2017, 2:04 PM  [facility] [address] [place], [place], [address] Phone: [phone]   Fax:  [phone]  Name: Samuel Roth MRN: [id number] Date of Birth: [birth year]

## 2017-08-12 ENCOUNTER — Ambulatory Visit: Payer: BC Managed Care – PPO | Attending: Orthopedic Surgery | Admitting: Physical Therapy

## 2017-08-12 DIAGNOSIS — M25661 Stiffness of right knee, not elsewhere classified: Secondary | ICD-10-CM | POA: Diagnosis not present

## 2017-08-12 DIAGNOSIS — M6281 Muscle weakness (generalized): Secondary | ICD-10-CM

## 2017-08-12 DIAGNOSIS — R2681 Unsteadiness on feet: Secondary | ICD-10-CM

## 2017-08-12 DIAGNOSIS — M79604 Pain in right leg: Secondary | ICD-10-CM

## 2017-08-12 DIAGNOSIS — R2689 Other abnormalities of gait and mobility: Secondary | ICD-10-CM | POA: Diagnosis not present

## 2017-08-12 DIAGNOSIS — R296 Repeated falls: Secondary | ICD-10-CM

## 2017-08-12 NOTE — Therapy (Signed)
[facility] [address] [place], [place], [address] Phone: [phone]   Fax:  [phone]  Physical Therapy Treatment  Patient Details  Name: Samuel Roth MRN: [id number] Date of Birth: Mar 02, 1951 Referring Provider: Meridee Score, MD   Encounter Date: 08/12/2017  PT End of Session - 08/12/17 1515    Visit Number  2    Number of Visits  17    Date for PT Re-Evaluation  10/04/17    Authorization Type  BCBS state primary & Medicare 2nd - G-code & progress note every 10th visit    Authorization Time Period  $1250 deductable met; 0 visit limit, no auth    PT Start Time  0401    PT Stop Time  0446    PT Time Calculation (min)  45 min    Equipment Utilized During Treatment  Gait belt    Activity Tolerance  Patient tolerated treatment well    Behavior During Therapy  [facility] for tasks assessed/performed       Past Medical History:  Diagnosis Date  . Glaucoma   . Legally blind     Past Surgical History:  Procedure Laterality Date  . EYE SURGERY    . TIBIA FRACTURE SURGERY      There were no vitals filed for this visit.  Subjective Assessment - 08/12/17 1506    Subjective  Pt states, in a joking manner, that he always feels pain, but he doesn't care. Was unable to get an accurate measure on pain level, as it does not seem to concern him. Pt came to therapy in a wheelchair, accompanied by his wife. No current issues or complaints. Pt is wearing prosthesis different amounts each day. Today he has had it on for 6 hours.    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  No/denies       [facility] Adult PT Treatment/Exercise - 08/12/17 1600       Prosthetics   Prosthetic Care Comments   pt reports wearing up to 6 hours straight now that he is back to work. Disucssed needed to take prosthesis off for skin check/sweat removal. Educated pt/spouse on signs of sweating and need to remove all moisture so to prevent skin breakdown. Both verbalized understanding. Did not increase wear time today. Check skin and increase at next visit if all is well.    Current prosthetic wear tolerance (days/week)   daily    Current prosthetic wear tolerance (#hours/day)   3 hours 2x day    Current prosthetic weight-bearing tolerance (hours/day)   Patient reports limb tenderness 6/10 with standing & gait partial weight bearing for 5 minutes.     Edema  none    Residual limb condition   intact per pt/spouse report    Education Provided  Proper wear schedule/adjustment;Proper weight-bearing schedule/adjustment;Residual limb care;Other (comment) see comments above   see comments above   Person(s) Educated  Patient;Spouse    Education Method  Explanation;Demonstration;Verbal cues    Education Method  Verbalized understanding;Verbal cues required;Needs further instruction    Donning Prosthesis  Minimal assist    Doffing Prosthesis  Minimal assist          [facility] Adult PT Treatment/Exercise -  08/12/17 0001      Neuro Re-ed    Neuro Re-ed Details   Educated with sink HEP for neuro re-ed for prosthetic. Refer to patient instruction for details. Pt needed multimodal cues to perform correctly and moderate redirection to task through out session.            PT Education - 08/12/17 1538    Education provided  Yes    Education Details  HEP at sink for neuro re-ed for prosthetic.    Person(s) Educated  Patient    Methods  Explanation;Demonstration;Tactile cues;Verbal cues;Handout    Comprehension  Verbalized understanding;Returned demonstration;Verbal cues required;Tactile cues required       PT Short Term Goals - 08/07/17 1847      PT SHORT TERM GOAL #1    Title  Patient verbalizes proper cleaning & demonstrates proper donning of prosthesis (All STGs Target Date 09/06/17)    Time  4    Period  Weeks    Status  New    Target Date  09/06/17      PT SHORT TERM GOAL #2   Title  Patient tolerates wear of prosthesis >8 hrs total /day without skin issues.     Time  4    Period  Weeks    Status  New    Target Date  09/06/17      PT SHORT TERM GOAL #3   Title  Patient ambulates 300' with RW outdoors including ramps & curbs with supervision.     Time  4    Period  Weeks    Status  New    Target Date  09/06/17      PT SHORT TERM GOAL #4   Title  Patient ambulates 150' with cane & prosthesis with minA.     Time  4    Period  Weeks    Status  New    Target Date  09/06/17      PT SHORT TERM GOAL #5   Title  Patient stands with prosthesis & no UE support reaching 5" & reaches towards floor to knee level with supervision.    Time  4    Period  Weeks    Target Date  09/06/17        PT Long Term Goals - 08/08/17 1340      PT LONG TERM GOAL #1   Title  Patient demonstrates & verbalizes proper prosthetic care to enable safe prosthesis use. (All LTGs Target Date 10/04/17)    Time  8    Period  Weeks    Status  New    Target Date  10/04/17      PT LONG TERM GOAL #2   Title  Patient tolerates prosthesis wear >90% of awake hours without skin issues or limb pain to enable function throughout his day.     Time  8    Period  Weeks    Status  New    Target Date  10/04/17      PT LONG TERM GOAL #3   Title  Berg Balance >36/56 to indicate lower fall risk.     Time  8    Period  Weeks    Status  New    Target Date  10/04/17      PT LONG TERM GOAL #4   Title  Patient ambulates around furniture with prosthesis only with assist for vision (reports independent in his apt).     Time  8  Period  Weeks    Status  New    Target Date  10/04/17      PT LONG TERM GOAL #5   Title  Patient ambulates 500' with cane & prosthesis with  supervision for vision without balance loss for community mobility.     Time  8    Period  Weeks    Status  New    Target Date  10/04/17      Additional Long Term Goals   Additional Long Term Goals  Yes      PT LONG TERM GOAL #6   Title  Patient negotiates stairs with 1 rail, ramps & curbs with cane & prosthesis with supervision for vision for community access.     Time  8    Period  Weeks    Status  New    Target Date  10/04/17      PT LONG TERM GOAL #7   Title  Patient demonstrates work simulation tasks including lifting and reaching with prosthesis safely.     Time  8    Period  Weeks    Status  New    Target Date  10/04/17      PT LONG TERM GOAL #8   Title  Patient reports limb pain </= 2/10 with standing activities.     Time  8    Period  Weeks    Status  New    Target Date  10/04/17            Plan - 08/12/17 1517    Clinical Impression Statement  Pt is legally blind (right eye complete and left eye sees light/darkness & shapes). Pt performed HEP at-sink neuro re-ed exercises. Pt performed activities well with standing and weight shifting, with no breaks throughout session. Pt will benefit from further PT sessions to increase mobility, safety and functional independence.    Rehab Potential  Good    Clinical Impairments Affecting Rehab Potential  right TTA, DM1, neuropathy, acute renal failure, COPD, glaucoma, legally blind, HTN,     PT Frequency  2x / week    PT Duration  8 weeks    PT Treatment/Interventions  ADLs/Self Care Home Management;DME Instruction;Gait training;Stair training;Functional mobility training;Therapeutic activities;Therapeutic exercise;Balance training;Neuromuscular re-education;Patient/family education;Prosthetic Training    PT Next Visit Plan  Review HEP, instruct in negotiating stairs 2 rails, curb & ramp with RW, review prosthetic care.    Consulted and Agree with Plan of Care  Patient;Family member/caregiver    Family Member Consulted   wife       Patient will benefit from skilled therapeutic intervention in order to improve the following deficits and impairments:  Abnormal gait, Decreased activity tolerance, Decreased balance, Decreased endurance, Decreased knowledge of use of DME, Decreased mobility, Decreased range of motion, Decreased strength, Impaired flexibility, Impaired vision/preception, Postural dysfunction, Prosthetic Dependency, Pain  Visit Diagnosis: Other abnormalities of gait and mobility  Stiffness of right knee, not elsewhere classified  Muscle weakness (generalized)  Unsteadiness on feet  Repeated falls  Pain in right leg     Problem List Patient Active Problem List   Diagnosis Date Noted  . Acquired absence of right leg below knee ([facility]) 03/13/2017  . 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 Diabetes mellitus, new onset ([facility])   . Acute renal failure ([facility])   . Acute kidney injury ([facility]) 02/27/2017  . Hyperglycemia 02/27/2017  . Leukocytosis 02/27/2017    Katherine Hild, SPTA 08/12/2017, 3:39   PM  [facility] [address] [place], [place], [address] Phone: [phone]   Fax:  [phone]  Name: Lliam Hoh MRN: [id number] Date of Birth: Feb 16, 1951   This note has been reviewed and edited by supervising CI. Added in prosthetic care section as PTA provided this education.  Willow Ora, PTA, [facility] [address] [place], [place] [address] [phone] 08/13/17, 8:55 AM

## 2017-08-12 NOTE — Patient Instructions (Signed)

## 2017-08-14 ENCOUNTER — Ambulatory Visit (INDEPENDENT_AMBULATORY_CARE_PROVIDER_SITE_OTHER): Payer: BC Managed Care – PPO | Admitting: Orthopedic Surgery

## 2017-08-14 ENCOUNTER — Encounter (INDEPENDENT_AMBULATORY_CARE_PROVIDER_SITE_OTHER): Payer: Self-pay | Admitting: Orthopedic Surgery

## 2017-08-14 DIAGNOSIS — Z89511 Acquired absence of right leg below knee: Secondary | ICD-10-CM

## 2017-08-14 NOTE — Progress Notes (Signed)
   Office Visit Note   Patient: Samuel SimsJohn Roth           Date of Birth: 1951-06-29           MRN: [id number] Visit Date: 08/14/2017              Requested by: No referring provider defined for this encounter. PCP: Patient, No Pcp Per  Chief Complaint  Patient presents with  . Right Leg - Follow-up      HPI: Patient is a 66 year old gentleman who is 5 months status post right transtibial amputation.  He is currently working with Zella Ballobin for physical therapy he has had his prosthesis fabricated by [employer] he is wearing 1 sock.  Patient states he is only been to 2 physical therapy appointments.  Patient complains of pain anterior laterally over the tibia.  Assessment & Plan: Visit Diagnoses:  1. History of right below knee amputation (HCC)     Plan: Continue with gait training with Robin increase his activities as tolerated.  Follow-Up Instructions: Return if symptoms worsen or fail to improve.   Ortho Exam  Patient is alert, oriented, no adenopathy, well-dressed, normal affect, normal respiratory effort. Examination patient has good hair growth on the residual limb it is consolidated well there is no redness no cellulitis no ulcer no drainage no swelling.  The area of tenderness is a pressure point but there is no skin breakdown no redness no signs of infection.  There is no end bearing callus.  Imaging: No results found. No images are attached to the encounter.  Labs: Lab Results  Component Value Date   HGBA1C 9.5 (H) 02/28/2017   CRP 29.1 (H) 03/01/2017   REPTSTATUS 03/04/2017 FINAL 02/27/2017   CULT  02/27/2017    NO GROWTH 5 DAYS Performed at [facility], [address] [place], [place] [address]     Orders:  No orders of the defined types were placed in this encounter.  No orders of the defined types were placed in this encounter.    Procedures: No procedures performed  Clinical Data: No additional findings.  ROS:  All other systems negative, except  as noted in the HPI. Review of Systems  Objective: Vital Signs: There were no vitals taken for this visit.  Specialty Comments:  No specialty comments available.  PMFS History: Patient Active Problem List   Diagnosis Date Noted  . Acquired absence of right leg below knee (HCC) 03/13/2017  . 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 Diabetes mellitus, new onset (HCC)   . Acute renal failure (HCC)   . Acute kidney injury (HCC) 02/27/2017  . Hyperglycemia 02/27/2017  . Leukocytosis 02/27/2017   Past Medical History:  Diagnosis Date  . Glaucoma   . Legally blind     History reviewed. No pertinent family history.  Past Surgical History:  Procedure Laterality Date  . EYE SURGERY    . TIBIA FRACTURE SURGERY     Social History   Occupational History  . Not on file  Tobacco Use  . Smoking status: Former Games developermoker  . Smokeless tobacco: Former Engineer, waterUser  Substance and Sexual Activity  . Alcohol use: Yes    Comment: social  . Drug use: No  . Sexual activity: Not on file

## 2017-08-15 ENCOUNTER — Ambulatory Visit: Payer: BC Managed Care – PPO | Admitting: Physical Therapy

## 2017-08-15 DIAGNOSIS — R2681 Unsteadiness on feet: Secondary | ICD-10-CM | POA: Diagnosis not present

## 2017-08-15 DIAGNOSIS — R2689 Other abnormalities of gait and mobility: Secondary | ICD-10-CM | POA: Diagnosis not present

## 2017-08-15 DIAGNOSIS — M79604 Pain in right leg: Secondary | ICD-10-CM

## 2017-08-15 DIAGNOSIS — M25661 Stiffness of right knee, not elsewhere classified: Secondary | ICD-10-CM | POA: Diagnosis not present

## 2017-08-15 DIAGNOSIS — M6281 Muscle weakness (generalized): Secondary | ICD-10-CM | POA: Diagnosis not present

## 2017-08-15 DIAGNOSIS — R296 Repeated falls: Secondary | ICD-10-CM

## 2017-08-15 NOTE — Therapy (Signed)
[facility] [address] [place], [place], [address] Phone: [phone]   Fax:  [phone]  Physical Therapy Treatment  Patient Details  Name: Samuel Roth MRN: [id number] Date of Birth: [birth year] Referring Provider: Meridee Score, MD   Encounter Date: 08/15/2017  PT End of Session - 08/15/17 1258    Visit Number  3    Number of Visits  17    Date for PT Re-Evaluation  10/04/17    Authorization Type  BCBS state primary & Medicare 2nd - G-code & progress note every 10th visit    Authorization Time Period  $1250 deductable met; 0 visit limit, no auth    PT Start Time  1300    PT Stop Time  1350    PT Time Calculation (min)  50 min    Equipment Utilized During Treatment  Gait belt    Activity Tolerance  Patient tolerated treatment well    Behavior During Therapy  [facility] for tasks assessed/performed       Past Medical History:  Diagnosis Date  . Glaucoma   . Legally blind     Past Surgical History:  Procedure Laterality Date  . EYE SURGERY    . TIBIA FRACTURE SURGERY      There were no vitals filed for this visit.  Subjective Assessment - 08/15/17 1358    Subjective  Pt reports he has no pain. Pt states he is wearing the prosthetic most of the day, but is not taking it off to clean or inspect. Pt felt some pain yesterday lateral of the tibial crest.     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  No/denies        [facility] Adult PT Treatment/Exercise - 08/15/17 1700      Transfers   Transfers  Sit to Stand;Stand to Sit;Squat Pivot Transfers x5 reps    Sit to Stand  5: Supervision;With upper extremity assist    Sit to Stand Details   Verbal cues for sequencing;Verbal cues for technique;Tactile cues for sequencing;Tactile cues for weight shifting;Tactile cues for placement;Verbal cues for safe use of DME/AE;Verbal cues for precautions/safety    Stand to Sit  5: Supervision;With upper extremity assist;To bed;To chair/3-in-1    Stand to Sit Details (indicate cue type and reason)  Tactile cues for sequencing;Tactile cues for weight shifting;Tactile cues for placement;Verbal cues for sequencing;Verbal cues for technique;Verbal cues for safe use of DME/AE    Squat Pivot Transfers  4: Min guard;5: Supervision    Comments  Pt's LE were getting twisted when transferring from w/c to mat table. VC's and demonstration provided for proper weight shift onto prosthesis and technique for squat pivot and sit/stand transfers. Pt verbalized understanding and demonstrated proper technique after skilled instruction with verbal/tactile cues on sequencing/technique.      Ambulation/Gait   Ambulation/Gait  Yes    Ambulation/Gait Assistance  5: Supervision    Ambulation Distance (Feet)  115 Feet x1, plus around gym with other activities    Assistive device  Prosthesis;Rolling walker    Gait Pattern  Decreased step length - left;Decreased stance time - right;Decreased stride length;Decreased hip/knee flexion - right;Decreased weight shift to right;Right hip hike;Right flexed knee  in stance;Antalgic;Lateral hip instability;Trunk flexed;Abducted- right;Step-to pattern    Stairs  Yes    Stairs Assistance  4: Min guard    Stair Management Technique  Step to pattern;One rail Right;Forwards One rail right ascending, left descending    Number of Stairs  4    Height of Stairs  6    Gait Comments  Pt currently ascending/descending stairs at his second-floor apt. in sitting. Performed 4 stairs x 5 sets with [employer]. Varied techniques to include bil handrails, progressing to  Rt handrail for ascent and descent using single UE on rail (nothing opposite)  progressing to bilat UE (both hands on same rail) with improved pt ability and safety in this way. Pt safest with use of both hands on single rail, howver needs continued practice before doing them out of PT sessions.       Balance   Balance Assessed  Yes      Dynamic Standing Balance   Dynamic Standing - Balance Support  Bilateral upper extremity supported    Dynamic Standing - Level of Assistance  4: Min assist    Dynamic Standing - Balance Activities  Lateral lean/weight shifting;Forward lean/weight shifting      Prosthetics   Prosthetic Care Comments   Pt reports wearing prosthesis most of the day. Pt educated on the need to take prosthesis off for skin check/sweat removal. Check skin and increase at next visit if all is well. Pt educated on increasing/decreasing # of socks based on residual limb shrinkage, to prevent sores or blisters and for comfort in the prosthesis. Pt might also need padding to improve proper sizing inside prosthesis.    Current prosthetic wear tolerance (days/week)   daily    Current prosthetic wear tolerance (#hours/day)   6+ hours/day    Current prosthetic weight-bearing tolerance (hours/day)   Patient reports pain at lateral tibial crest and presents with multiple red areas of residual limb.    Edema  none    Residual limb condition   intact    Education Provided  Skin check;Residual limb care;Correct ply sock adjustment            PT Education - 08/16/17 1130    Education provided  Yes    Education Details  Educated benefit of additional socks. See prosthetic section for details.    Person(s) Educated  Patient    Methods  Explanation;Demonstration;Verbal cues    Comprehension  Verbalized understanding       PT Short Term Goals - 08/07/17 1847      PT SHORT TERM GOAL #1   Title  Patient verbalizes proper cleaning & demonstrates proper donning of prosthesis (All STGs Target Date 09/06/17)    Time  4    Period  Weeks    Status  New    Target Date   09/06/17      PT SHORT TERM GOAL #2   Title  Patient tolerates wear of prosthesis >8 hrs total /day without skin issues.     Time  4    Period  Weeks    Status  New    Target Date  09/06/17      PT SHORT TERM GOAL #3   Title  Patient ambulates 300' with RW outdoors including ramps & curbs with supervision.     Time  4    Period  Weeks    Status  New    Target Date  09/06/17      PT SHORT TERM GOAL #  4   Title  Patient ambulates 150' with cane & prosthesis with minA.     Time  4    Period  Weeks    Status  New    Target Date  09/06/17      PT SHORT TERM GOAL #5   Title  Patient stands with prosthesis & no UE support reaching 5" & reaches towards floor to knee level with supervision.    Time  4    Period  Weeks    Target Date  09/06/17        PT Long Term Goals - 08/08/17 1340      PT LONG TERM GOAL #1   Title  Patient demonstrates & verbalizes proper prosthetic care to enable safe prosthesis use. (All LTGs Target Date 10/04/17)    Time  8    Period  Weeks    Status  New    Target Date  10/04/17      PT LONG TERM GOAL #2   Title  Patient tolerates prosthesis wear >90% of awake hours without skin issues or limb pain to enable function throughout his day.     Time  8    Period  Weeks    Status  New    Target Date  10/04/17      PT LONG TERM GOAL #3   Title  Berg Balance >36/56 to indicate lower fall risk.     Time  8    Period  Weeks    Status  New    Target Date  10/04/17      PT LONG TERM GOAL #4   Title  Patient ambulates around furniture with prosthesis only with assist for vision (reports independent in his apt).     Time  8    Period  Weeks    Status  New    Target Date  10/04/17      PT LONG TERM GOAL #5   Title  Patient ambulates 500' with cane & prosthesis with supervision for vision without balance loss for community mobility.     Time  8    Period  Weeks    Status  New    Target Date  10/04/17      Additional Long Term Goals   Additional  Long Term Goals  Yes      PT LONG TERM GOAL #6   Title  Patient negotiates stairs with 1 rail, ramps & curbs with cane & prosthesis with supervision for vision for community access.     Time  8    Period  Weeks    Status  New    Target Date  10/04/17      PT LONG TERM GOAL #7   Title  Patient demonstrates work simulation tasks including lifting and reaching with prosthesis safely.     Time  8    Period  Weeks    Status  New    Target Date  10/04/17      PT LONG TERM GOAL #8   Title  Patient reports limb pain </= 2/10 with standing activities.     Time  8    Period  Weeks    Status  New    Target Date  10/04/17            Plan - 08/15/17 1407    Clinical Impression Statement  Pt felt pain yesterday lateral of the tibial crest. Upon inspection by PTA, pt has several  red areas on affected limb and was given an extra 3-ply to bring sock count to 8-ply to alleviate pain and redness and increase comfort. Pt ambulated with RW and negotiated stairs to improve functional activity in the community. Pt will continue to benefit from PT to further progress with functional independence.     Clinical Impairments Affecting Rehab Potential  right TTA, DM1, neuropathy, acute renal failure, COPD, glaucoma, legally blind, HTN,     PT Frequency  2x / week    PT Duration  8 weeks    PT Treatment/Interventions  ADLs/Self Care Home Management;DME Instruction;Gait training;Stair training;Functional mobility training;Therapeutic activities;Therapeutic exercise;Balance training;Neuromuscular re-education;Patient/family education;Prosthetic Training    PT Next Visit Plan  Stair training, curb, ramp with RW, weight shifting and balance activities.    Consulted and Agree with Plan of Care  Patient;Family member/caregiver    Family Member Consulted  wife       Patient will benefit from skilled therapeutic intervention in order to improve the following deficits and impairments:  Abnormal gait, Decreased  activity tolerance, Decreased balance, Decreased endurance, Decreased knowledge of use of DME, Decreased mobility, Decreased range of motion, Decreased strength, Impaired flexibility, Impaired vision/preception, Postural dysfunction, Prosthetic Dependency, Pain  Visit Diagnosis: Other abnormalities of gait and mobility  Stiffness of right knee, not elsewhere classified  Muscle weakness (generalized)  Unsteadiness on feet  Repeated falls  Pain in right leg     Problem List Patient Active Problem List   Diagnosis Date Noted  . Acquired absence of right leg below knee ([place]) 03/13/2017  . Diabetic polyneuropathy associated with type 1 diabetes mellitus ([place])   . Diabetes mellitus, new onset ([place])   . Acute renal failure ([place])   . Acute kidney injury ([place]) 02/27/2017  . Hyperglycemia 02/27/2017  . Leukocytosis 02/27/2017    Andria Meuse, SPTA 08/16/2017, 11:36 AM  [facility] [address] [place], [place], [address] Phone: [phone]   Fax:  [phone]  Name: Amil Bouwman MRN: [id number] Date of Birth: 02-08-1951  This note has been reviewed and edited by supervising CI. Additional details added to treatment section.  Willow Ora, PTA, [facility] [address], [place], [place] [address] [phone] 08/16/17, 4:45 PM

## 2017-08-20 ENCOUNTER — Ambulatory Visit: Payer: BC Managed Care – PPO | Admitting: Physical Therapy

## 2017-08-20 ENCOUNTER — Encounter: Payer: Self-pay | Admitting: Physical Therapy

## 2017-08-20 DIAGNOSIS — M6281 Muscle weakness (generalized): Secondary | ICD-10-CM

## 2017-08-20 DIAGNOSIS — M25661 Stiffness of right knee, not elsewhere classified: Secondary | ICD-10-CM | POA: Diagnosis not present

## 2017-08-20 DIAGNOSIS — R2681 Unsteadiness on feet: Secondary | ICD-10-CM | POA: Diagnosis not present

## 2017-08-20 DIAGNOSIS — M79604 Pain in right leg: Secondary | ICD-10-CM | POA: Diagnosis not present

## 2017-08-20 DIAGNOSIS — R296 Repeated falls: Secondary | ICD-10-CM | POA: Diagnosis not present

## 2017-08-20 DIAGNOSIS — R2689 Other abnormalities of gait and mobility: Secondary | ICD-10-CM

## 2017-08-20 NOTE — Therapy (Signed)
[facility] [address] [place], [place], [address] Phone: [phone]   Fax:  [phone]  Physical Therapy Treatment  Patient Details  Name: Samuel Roth MRN: [id number] Date of Birth: 11/13/1950 Referring Provider: Meridee Score, MD   Encounter Date: 08/20/2017  PT End of Session - 08/20/17 1557    Visit Number  4    Number of Visits  17    Date for PT Re-Evaluation  10/04/17    Authorization Type  BCBS state primary & Medicare 2nd - G-code & progress note every 10th visit    Authorization Time Period  $1250 deductable met; 0 visit limit, no auth    PT Start Time  1315    PT Stop Time  1400    PT Time Calculation (min)  45 min    Equipment Utilized During Treatment  Gait belt    Activity Tolerance  Patient tolerated treatment well    Behavior During Therapy  [facility] for tasks assessed/performed       Past Medical History:  Diagnosis Date  . Glaucoma   . Legally blind     Past Surgical History:  Procedure Laterality Date  . EYE SURGERY    . TIBIA FRACTURE SURGERY      There were no vitals filed for this visit.  Subjective Assessment - 08/20/17 1316    Subjective  He has been wearing the prosthesis ~6 hrs/day. The prosthesis is tight.     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  No/denies                      [facility] Adult PT Treatment/Exercise - 08/20/17 1315      Transfers   Transfers  Sit to Stand;Stand to Sit;Squat Pivot Transfers x5 reps    Sit to Stand  5: Supervision;With upper extremity assist;With armrests;From chair/3-in-1 to RW with TTA prosthesis    Sit to Stand Details  Verbal cues for sequencing;Verbal cues for technique;Tactile cues for  sequencing;Tactile cues for weight shifting;Tactile cues for placement;Verbal cues for safe use of DME/AE;Verbal cues for precautions/safety    Stand to Sit  5: Supervision;With upper extremity assist;To chair/3-in-1;With armrests from RW, with TTA prosthesis    Stand to Sit Details (indicate cue type and reason)  Tactile cues for sequencing;Tactile cues for weight shifting;Tactile cues for placement;Verbal cues for sequencing;Verbal cues for technique;Verbal cues for safe use of DME/AE    Squat Pivot Transfers  --    Comments  --      Ambulation/Gait   Ambulation/Gait  Yes    Ambulation/Gait Assistance  5: Supervision    Ambulation/Gait Assistance Details  verbal & tactile cues on step thru pattern with proper prosthesis step length and moving RW after LE to facilitate upright posture & wt transfer to stance limb.     Ambulation Distance (Feet)  250 Feet 30' X 2 working on sock ply perception, 250' X 2    Assistive device  Prosthesis;Rolling walker    Gait Pattern  Decreased step length - left;Decreased stance time - right;Decreased stride length;Decreased hip/knee flexion - right;Decreased weight shift to right;Right hip hike;Right flexed knee in stance;Antalgic;Lateral hip instability;Trunk flexed;Abducted- right;Step-to pattern    Ambulation Surface  Indoor;Level  Stairs  Yes    Stairs Assistance  4: Min guard;5: Supervision    Stairs Assistance Details (indicate cue type and reason)  technique with TTA prosthesis. Using prosthesis to percieve edge of step descending.  Progressed to use of cane opposite rail when one rail available    Stair Management Technique  One rail Left;Step to pattern;Forwards left ascending, right descending    Number of Stairs  4 5 reps (1x 2 rails, 1xBUEs 1 rail, 3x rail & cane)    Height of Stairs  6    Ramp  4: Min assist RW & prosthesis, 3 reps    Ramp Details (indicate cue type and reason)  PT verbal, tactile cues on upright posture & proper wt shift      Curb  4: Min assist;5: Supervision RW & prosthesis, 5 reps    Curb Details (indicate cue type and reason)  verbal & tactile cues on technique including foot position, RW movement and wt shift    Gait Comments  --      Balance   Balance Assessed  --      Dynamic Standing Balance   Dynamic Standing - Balance Support  --    Dynamic Standing - Level of Assistance  --    Dynamic Standing - Balance Activities  --      Prosthetics   Prosthetic Care Comments   PT instructed in adjusting ply socks using perception with too few, too many & proper ply. Wearing prosthesis 2x/day to increase total wear.     Current prosthetic wear tolerance (days/week)   daily    Current prosthetic wear tolerance (#hours/day)   6+ hours/day    Current prosthetic weight-bearing tolerance (hours/day)   --    Edema  none    Residual limb condition   intact    Education Provided  Skin check;Residual limb care;Correct ply sock adjustment;Proper Donning;Proper wear schedule/adjustment;Other (comment) see prosthetic care comment    Person(s) Educated  Patient    Education Method  Explanation;Demonstration;Verbal cues;Tactile cues    Education Method  Verbalized understanding;Verbal cues required;Needs further instruction               PT Short Term Goals - 08/20/17 1558      PT SHORT TERM GOAL #1   Title  Patient verbalizes proper cleaning & demonstrates proper donning of prosthesis (All STGs Target Date 09/06/17)    Time  4    Period  Weeks    Status  On-going    Target Date  09/06/17      PT SHORT TERM GOAL #2   Title  Patient tolerates wear of prosthesis >8 hrs total /day without skin issues.     Time  4    Period  Weeks    Status  On-going    Target Date  09/06/17      PT SHORT TERM GOAL #3   Title  Patient ambulates 300' with RW outdoors including ramps & curbs with supervision.     Time  4    Period  Weeks    Status  On-going    Target Date  09/06/17      PT SHORT TERM GOAL #4   Title   Patient ambulates 150' with cane & prosthesis with minA.     Time  4    Period  Weeks    Status  On-going    Target Date  09/06/17      PT SHORT TERM GOAL #5  Title  Patient stands with prosthesis & no UE support reaching 5" & reaches towards floor to knee level with supervision.    Time  4    Period  Weeks    Status  On-going    Target Date  09/06/17        PT Long Term Goals - 08/20/17 1558      PT LONG TERM GOAL #1   Title  Patient demonstrates & verbalizes proper prosthetic care to enable safe prosthesis use. (All LTGs Target Date 10/04/17)    Time  8    Period  Weeks    Status  On-going    Target Date  10/04/17      PT LONG TERM GOAL #2   Title  Patient tolerates prosthesis wear >90% of awake hours without skin issues or limb pain to enable function throughout his day.     Time  8    Period  Weeks    Status  On-going    Target Date  10/04/17      PT LONG TERM GOAL #3   Title  Berg Balance >36/56 to indicate lower fall risk.     Time  8    Period  Weeks    Status  On-going    Target Date  10/04/17      PT LONG TERM GOAL #4   Title  Patient ambulates around furniture with prosthesis only with assist for vision (reports independent in his apt).     Time  8    Period  Weeks    Status  On-going    Target Date  10/04/17      PT LONG TERM GOAL #5   Title  Patient ambulates 500' with cane & prosthesis with supervision for vision without balance loss for community mobility.     Time  8    Period  Weeks    Status  On-going    Target Date  10/04/17      PT LONG TERM GOAL #6   Title  Patient negotiates stairs with 1 rail, ramps & curbs with cane & prosthesis with supervision for vision for community access.     Time  8    Period  Weeks    Status  On-going    Target Date  10/04/17      PT LONG TERM GOAL #7   Title  Patient demonstrates work simulation tasks including lifting and reaching with prosthesis safely.     Time  8    Period  Weeks    Status  On-going     Target Date  10/04/17      PT LONG TERM GOAL #8   Title  Patient reports limb pain </= 2/10 with standing activities.     Time  8    Period  Weeks    Status  On-going    Target Date  10/04/17            Plan - 08/20/17 1559    Clinical Impression Statement  Patient improved understanding of adjusting ply socks with varying with weight bearing. Patient also improved stairs with single rail like at his apt with rail & cane. He needs additional training for ramps & curbs but did well for initial time.     Rehab Potential  Good    Clinical Impairments Affecting Rehab Potential  right TTA, DM1, neuropathy, acute renal failure, COPD, glaucoma, legally blind, HTN,     PT Frequency  2x /  week    PT Duration  8 weeks    PT Treatment/Interventions  ADLs/Self Care Home Management;DME Instruction;Gait training;Stair training;Functional mobility training;Therapeutic activities;Therapeutic exercise;Balance training;Neuromuscular re-education;Patient/family education;Prosthetic Training    PT Next Visit Plan  Prosthetic gait with RW, Stair training, curb, ramp with RW, weight shifting and balance activities. Work on picking up objects from floor & reaching.     Consulted and Agree with Plan of Care  Patient       Patient will benefit from skilled therapeutic intervention in order to improve the following deficits and impairments:  Abnormal gait, Decreased activity tolerance, Decreased balance, Decreased endurance, Decreased knowledge of use of DME, Decreased mobility, Decreased range of motion, Decreased strength, Impaired flexibility, Impaired vision/preception, Postural dysfunction, Prosthetic Dependency, Pain  Visit Diagnosis: Other abnormalities of gait and mobility  Stiffness of right knee, not elsewhere classified  Muscle weakness (generalized)  Unsteadiness on feet     Problem List Patient Active Problem List   Diagnosis Date Noted  . Acquired absence of right leg below knee  ([place]) 03/13/2017  . Diabetic polyneuropathy associated with type 1 diabetes mellitus ([place])   . Diabetes mellitus, new onset ([place])   . Acute renal failure ([place])   . Acute kidney injury ([place]) 02/27/2017  . Hyperglycemia 02/27/2017  . Leukocytosis 02/27/2017    Cloa Bushong PT, DPT 08/20/2017, 4:03 PM  [facility] [address] [place], [place], [address] Phone: [phone]   Fax:  [phone]  Name: Samuel Roth MRN: [id number] Date of Birth: Jul 20, 1951

## 2017-08-23 ENCOUNTER — Encounter: Payer: Self-pay | Admitting: Physical Therapy

## 2017-08-23 ENCOUNTER — Ambulatory Visit: Payer: BC Managed Care – PPO | Admitting: Physical Therapy

## 2017-08-23 DIAGNOSIS — M25661 Stiffness of right knee, not elsewhere classified: Secondary | ICD-10-CM | POA: Diagnosis not present

## 2017-08-23 DIAGNOSIS — M6281 Muscle weakness (generalized): Secondary | ICD-10-CM | POA: Diagnosis not present

## 2017-08-23 DIAGNOSIS — M79604 Pain in right leg: Secondary | ICD-10-CM | POA: Diagnosis not present

## 2017-08-23 DIAGNOSIS — R296 Repeated falls: Secondary | ICD-10-CM | POA: Diagnosis not present

## 2017-08-23 DIAGNOSIS — R2681 Unsteadiness on feet: Secondary | ICD-10-CM | POA: Diagnosis not present

## 2017-08-23 DIAGNOSIS — R2689 Other abnormalities of gait and mobility: Secondary | ICD-10-CM

## 2017-08-23 NOTE — Therapy (Signed)
[facility] [address] [place], [place], [address] Phone: [phone]   Fax:  [phone]  Physical Therapy Treatment  Patient Details  Name: Samuel Roth MRN: [id number] Date of Birth: 03-30-1951 Referring Provider: Meridee Score, MD   Encounter Date: 08/23/2017  PT End of Session - 08/23/17 1102    Visit Number  5    Number of Visits  17    Date for PT Re-Evaluation  10/04/17    Authorization Type  BCBS state primary & Medicare 2nd - G-code & progress note every 10th visit    Authorization Time Period  $1250 deductable met; 0 visit limit, no auth    PT Start Time  1100    PT Stop Time  1145    PT Time Calculation (min)  45 min    Equipment Utilized During Treatment  Gait belt    Activity Tolerance  Patient tolerated treatment well    Behavior During Therapy  WFL for tasks assessed/performed       Past Medical History:  Diagnosis Date  . Glaucoma   . Legally blind     Past Surgical History:  Procedure Laterality Date  . EYE SURGERY    . RIGHT BKA Right 03/03/2017   Performed by Newt Minion, MD at [facility] Right 03/01/2017   Performed by Newt Minion, MD at [facility]  . TIBIA FRACTURE SURGERY      There were no vitals filed for this visit.  Subjective Assessment - 08/23/17 1102    Subjective  No new complaints, no falls. Pt reports left knee pain pt reported he forgot to take advil for the pain.     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  Yes    Pain Score  3     Pain Location  Knee    Pain Orientation  Left    Pain Type  Chronic pain    Pain Frequency  Intermittent       [facility] Adult  PT Treatment/Exercise - 08/23/17 1202      Ambulation/Gait   Ambulation/Gait  Yes    Ambulation/Gait Assistance  4: Min guard    Ambulation/Gait Assistance Details  Pt ambulated with RW around blue circle x2 with a stop at the counter after first lap for activity. Pt required constant redirection and cues. Pt min guard for gait.     Ambulation Distance (Feet)  230 Feet    Assistive device  Rolling walker;Prosthesis    Gait Pattern  Decreased stance time - right;Decreased hip/knee flexion - right;Right hip hike;Lateral hip instability;Trunk flexed;Step-to pattern    Ambulation Surface  Level;Indoor    Stairs  Yes    Stairs Assistance  4: Min guard;4: Min assist    Stairs Assistance Details (indicate cue type and reason)  Pt ascended/descended stairs x2 with straight cane. Pt required cues for sequencing with AD, min guard/min assist.     Stair Management Technique  One rail Left;Step to pattern;Forwards;With cane    Number of Stairs  4 x2    Height of Stairs  6    Ramp  4: Min assist    Ramp Details (  indicate cue type and reason)  Pt min assist with multiple cues to focus on task.     Curb  4: Min assist    Curb Details (indicate cue type and reason)  Pt min assist with max cues to redirect and for sequencing/technique with RW.      Neuro Re-ed    Neuro Re-ed Details   Pt standing in front of mat with with BUE support on walker performing weight shift forward/back and left/right. Pt required cues to lock L knee when shifting weight to left side, pt decreased time when shifting on RLE. Cues for pt to advance foot of the ground while shifting. Pt then progressed to stepping left to right and forward/back to center.   Pt standing in front of mat with walker in front for support bending down to pick up object from floor with staggered stance technique, pt tolerated this well with min guard.  Pt standing at counter performing UE reaches while off weighting opposite foot up to toes then alt. to other  side x10. Pt required max cues during this task and constant redirection.       Exercises   Exercises  Knee/Hip      Knee/Hip Exercises: Standing   Hip Extension  AROM;Stengthening;Both;10 reps;Knee straight    Extension Limitations  Pt standing at counter for support, cues for technique, posture and slow controlled movements.        PT Short Term Goals - 08/20/17 1558      PT SHORT TERM GOAL #1   Title  Patient verbalizes proper cleaning & demonstrates proper donning of prosthesis (All STGs Target Date 09/06/17)    Time  4    Period  Weeks    Status  On-going    Target Date  09/06/17      PT SHORT TERM GOAL #2   Title  Patient tolerates wear of prosthesis >8 hrs total /day without skin issues.     Time  4    Period  Weeks    Status  On-going    Target Date  09/06/17      PT SHORT TERM GOAL #3   Title  Patient ambulates 300' with RW outdoors including ramps & curbs with supervision.     Time  4    Period  Weeks    Status  On-going    Target Date  09/06/17      PT SHORT TERM GOAL #4   Title  Patient ambulates 150' with cane & prosthesis with minA.     Time  4    Period  Weeks    Status  On-going    Target Date  09/06/17      PT SHORT TERM GOAL #5   Title  Patient stands with prosthesis & no UE support reaching 5" & reaches towards floor to knee level with supervision.    Time  4    Period  Weeks    Status  On-going    Target Date  09/06/17        PT Long Term Goals - 08/20/17 1558      PT LONG TERM GOAL #1   Title  Patient demonstrates & verbalizes proper prosthetic care to enable safe prosthesis use. (All LTGs Target Date 10/04/17)    Time  8    Period  Weeks    Status  On-going    Target Date  10/04/17      PT LONG TERM GOAL #2  Title  Patient tolerates prosthesis wear >90% of awake hours without skin issues or limb pain to enable function throughout his day.     Time  8    Period  Weeks    Status  On-going    Target Date  10/04/17      PT LONG  TERM GOAL #3   Title  Berg Balance >36/56 to indicate lower fall risk.     Time  8    Period  Weeks    Status  On-going    Target Date  10/04/17      PT LONG TERM GOAL #4   Title  Patient ambulates around furniture with prosthesis only with assist for vision (reports independent in his apt).     Time  8    Period  Weeks    Status  On-going    Target Date  10/04/17      PT LONG TERM GOAL #5   Title  Patient ambulates 500' with cane & prosthesis with supervision for vision without balance loss for community mobility.     Time  8    Period  Weeks    Status  On-going    Target Date  10/04/17      PT LONG TERM GOAL #6   Title  Patient negotiates stairs with 1 rail, ramps & curbs with cane & prosthesis with supervision for vision for community access.     Time  8    Period  Weeks    Status  On-going    Target Date  10/04/17      PT LONG TERM GOAL #7   Title  Patient demonstrates work simulation tasks including lifting and reaching with prosthesis safely.     Time  8    Period  Weeks    Status  On-going    Target Date  10/04/17      PT LONG TERM GOAL #8   Title  Patient reports limb pain </= 2/10 with standing activities.     Time  8    Period  Weeks    Status  On-going    Target Date  10/04/17            Plan - 08/23/17 1301    Clinical Impression Statement  Pt tolerated treatment well with no limitations due to pain or fatigue. Todays session focused on Prosthetic gait training and balance activities. Pt required multiple cues with tasks and continuous redirection. Pt would benefit from continued PT sessions to progress towards goals.     Rehab Potential  Good    Clinical Impairments Affecting Rehab Potential  right TTA, DM1, neuropathy, acute renal failure, COPD, glaucoma, legally blind, HTN,     PT Frequency  2x / week    PT Duration  8 weeks    PT Treatment/Interventions  ADLs/Self Care Home Management;DME Instruction;Gait training;Stair training;Functional  mobility training;Therapeutic activities;Therapeutic exercise;Balance training;Neuromuscular re-education;Patient/family education;Prosthetic Training    PT Next Visit Plan  Continue Prosthetic gait with RW, Stair training, curb, ramp with RW, weight shifting and balance activities. Work on activities reaching outside [place].     Consulted and Agree with Plan of Care  Patient       Patient will benefit from skilled therapeutic intervention in order to improve the following deficits and impairments:  Abnormal gait, Decreased activity tolerance, Decreased balance, Decreased endurance, Decreased knowledge of use of DME, Decreased mobility, Decreased range of motion, Decreased strength, Impaired flexibility, Impaired vision/preception, Postural dysfunction, Prosthetic  Dependency, Pain  Visit Diagnosis: Other abnormalities of gait and mobility  Stiffness of right knee, not elsewhere classified  Muscle weakness (generalized)  Unsteadiness on feet     Problem List Patient Active Problem List   Diagnosis Date Noted  . Acquired absence of right leg below knee ([place]) 03/13/2017  . Diabetic polyneuropathy associated with type 1 diabetes mellitus ([place])   . Diabetes mellitus, new onset ([place] [facility])   . Acute renal failure ([facility])   . Acute kidney injury ([facility]) 02/27/2017  . Hyperglycemia 02/27/2017  . Leukocytosis 02/27/2017   Sawyer Kahan, SPTA  Thong Feeny 08/23/2017, 1:05 PM  [facility] [address] [place], [place], [address] Phone: [phone]   Fax:  [phone]  Name: Alwyn Cordner MRN: [id number] Date of Birth: 1950-12-27

## 2017-08-27 ENCOUNTER — Ambulatory Visit: Payer: BC Managed Care – PPO | Admitting: Physical Therapy

## 2017-08-27 ENCOUNTER — Encounter: Payer: Self-pay | Admitting: Physical Therapy

## 2017-08-27 DIAGNOSIS — R2689 Other abnormalities of gait and mobility: Secondary | ICD-10-CM | POA: Diagnosis not present

## 2017-08-27 DIAGNOSIS — M25661 Stiffness of right knee, not elsewhere classified: Secondary | ICD-10-CM

## 2017-08-27 DIAGNOSIS — M79604 Pain in right leg: Secondary | ICD-10-CM | POA: Diagnosis not present

## 2017-08-27 DIAGNOSIS — R296 Repeated falls: Secondary | ICD-10-CM | POA: Diagnosis not present

## 2017-08-27 DIAGNOSIS — M6281 Muscle weakness (generalized): Secondary | ICD-10-CM

## 2017-08-27 DIAGNOSIS — R2681 Unsteadiness on feet: Secondary | ICD-10-CM

## 2017-08-27 NOTE — Therapy (Signed)
[facility] [address] [place], [place], [address] Phone: [phone]   Fax:  [phone]  Physical Therapy Treatment  Patient Details  Name: Samuel Roth MRN: [id number] Date of Birth: 10/07/1951 Referring Provider: Meridee Score, MD   Encounter Date: 08/27/2017  PT End of Session - 08/27/17 1106    Visit Number  6    Number of Visits  17    Date for PT Re-Evaluation  10/04/17    Authorization Type  BCBS state primary & Medicare 2nd - G-code & progress note every 10th visit    Authorization Time Period  $1250 deductable met; 0 visit limit, no auth    PT Start Time  1100    PT Stop Time  1148    PT Time Calculation (min)  48 min    Equipment Utilized During Treatment  Gait belt    Activity Tolerance  Patient tolerated treatment well    Behavior During Therapy  WFL for tasks assessed/performed       Past Medical History:  Diagnosis Date  . Glaucoma   . Legally blind     Past Surgical History:  Procedure Laterality Date  . AMPUTATION Right 03/01/2017   Procedure: RIGHT TRANS METATARSAL AMPUTATION;  Surgeon: Newt Minion, MD;  Location: [facility];  Service: Orthopedics;  Laterality: Right;  . AMPUTATION Right 03/03/2017   Procedure: RIGHT BKA;  Surgeon: Newt Minion, MD;  Location: [facility];  Service: Orthopedics;  Laterality: Right;  . EYE SURGERY    . TIBIA FRACTURE SURGERY      There were no vitals filed for this visit.  Subjective Assessment - 08/27/17 1103    Subjective  Pt complaints of breakout on skin under prosthesis, no falls to report.     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  Yes    Pain Score  3     Pain Location   Knee    Pain Orientation  Left    Pain Type  Chronic pain       [facility] Adult PT Treatment/Exercise - 08/27/17 1409      Ambulation/Gait   Ambulation/Gait  Yes    Ambulation/Gait Assistance  4: Min guard;5: Supervision Initially Min guard & progressed to supervision    Ambulation/Gait Assistance Details  Pt ambulating with RW, min guard for safety. Cues for posture, to look straight ahead with gait and to increase step length with LLE. Therapist slowly advancing RW from front to simulate a step throught pattern.     Ambulation Distance (Feet)  230 Feet    Assistive device  Rolling walker    Gait Pattern  Trunk flexed;Step-through pattern;Decreased step length - left    Stairs  Yes    Stairs Assistance  4: Min guard;4: Min assist;5: Supervision progressed to supervision for last rep with verbal cues    Stairs Assistance Details (indicate cue type and reason)  Pt performing step to pattern with cane.  Cues for cane/foot placement, sequencing, and weight shift over stance limb with descent.     Stair Management Technique  One rail Left;With cane;Forwards;Step to pattern    Number of Stairs  4 x6    Height of  Stairs  6    Ramp  5: Supervision    Ramp Details (indicate cue type and reason)  Cues to weight shift anterior over prosthetic foot with toe pressure ascending ramp/ heel pressure descending ramp to avoid fall risk.    Curb  5: Supervision    Curb Details (indicate cue type and reason)  Cues for technique/sequencing with RW      Self-Care   Self-Care  --    Other Self-Care Comments   --      Prosthetics   Prosthetic Care Comments   Therapist checking skin breakdown under prosthesis, applying Tegaderm to area and educating pt on the importance of on/off time, when to take off the prosthesis and dry the area and products (antiperspirant) that would decrease sweat in that area.     Current prosthetic wear tolerance (days/week)   daily    Current prosthetic wear tolerance (#hours/day)    6+ hours/day PT cued 4-5hrs 2x/day to control sweat better    Edema  none    Residual limb condition   medial knee with superficial skin breakdown that appears to be blister that broke. No signs of infection noted.     Education Provided  Skin check;Residual limb care;Correct ply sock adjustment;Proper Donning;Proper wear schedule/adjustment;Other (comment) see prosthetic care comments    Person(s) Educated  Patient    Education Method  Explanation;Demonstration;Verbal cues    Education Method  Verbalized understanding;Verbal cues required;Needs further instruction       PT Education - 08/27/17 1106    Education provided  Yes    Education Details  Pt educated on prosthesis wear time of 5hrs/ remove after 2.5 hrs to wipe off excess sweat/re apply, leave off for 2 hrs, reapply for another 5 hrs with same instruction in a single day. Pt also educated on the benefits of Antiperspirant to decrease sweat under prosthesis and avoid skin breakdown.      Person(s) Educated  Patient    Methods  Explanation    Comprehension  Verbalized understanding       PT Short Term Goals - 08/20/17 1558      PT SHORT TERM GOAL #1   Title  Patient verbalizes proper cleaning & demonstrates proper donning of prosthesis (All STGs Target Date 09/06/17)    Time  4    Period  Weeks    Status  On-going    Target Date  09/06/17      PT SHORT TERM GOAL #2   Title  Patient tolerates wear of prosthesis >8 hrs total /day without skin issues.     Time  4    Period  Weeks    Status  On-going    Target Date  09/06/17      PT SHORT TERM GOAL #3   Title  Patient ambulates 300' with RW outdoors including ramps & curbs with supervision.     Time  4    Period  Weeks    Status  On-going    Target Date  09/06/17      PT SHORT TERM GOAL #4   Title  Patient ambulates 150' with cane & prosthesis with minA.     Time  4    Period  Weeks    Status  On-going    Target Date  09/06/17      PT SHORT TERM GOAL #5   Title   Patient stands with prosthesis & no UE support reaching 5" & reaches towards floor to knee  level with supervision.    Time  4    Period  Weeks    Status  On-going    Target Date  09/06/17        PT Long Term Goals - 08/20/17 1558      PT LONG TERM GOAL #1   Title  Patient demonstrates & verbalizes proper prosthetic care to enable safe prosthesis use. (All LTGs Target Date 10/04/17)    Time  8    Period  Weeks    Status  On-going    Target Date  10/04/17      PT LONG TERM GOAL #2   Title  Patient tolerates prosthesis wear >90% of awake hours without skin issues or limb pain to enable function throughout his day.     Time  8    Period  Weeks    Status  On-going    Target Date  10/04/17      PT LONG TERM GOAL #3   Title  Berg Balance >36/56 to indicate lower fall risk.     Time  8    Period  Weeks    Status  On-going    Target Date  10/04/17      PT LONG TERM GOAL #4   Title  Patient ambulates around furniture with prosthesis only with assist for vision (reports independent in his apt).     Time  8    Period  Weeks    Status  On-going    Target Date  10/04/17      PT LONG TERM GOAL #5   Title  Patient ambulates 500' with cane & prosthesis with supervision for vision without balance loss for community mobility.     Time  8    Period  Weeks    Status  On-going    Target Date  10/04/17      PT LONG TERM GOAL #6   Title  Patient negotiates stairs with 1 rail, ramps & curbs with cane & prosthesis with supervision for vision for community access.     Time  8    Period  Weeks    Status  On-going    Target Date  10/04/17      PT LONG TERM GOAL #7   Title  Patient demonstrates work simulation tasks including lifting and reaching with prosthesis safely.     Time  8    Period  Weeks    Status  On-going    Target Date  10/04/17      PT LONG TERM GOAL #8   Title  Patient reports limb pain </= 2/10 with standing activities.     Time  8    Period  Weeks    Status   On-going    Target Date  10/04/17            Plan - 08/27/17 1429    Clinical Impression Statement  Pt tolerated treatment well with no limitations due to pain or fatigue. Todays session focused on prothetic gait training and education on prosthesis wear/care. Pt would benefit from continued PT session to progress towards goals.     Rehab Potential  Good    Clinical Impairments Affecting Rehab Potential  right TTA, DM1, neuropathy, acute renal failure, COPD, glaucoma, legally blind, HTN,     PT Frequency  2x / week    PT Duration  8 weeks    PT Treatment/Interventions  ADLs/Self Care Home Management;DME Instruction;Gait training;Stair training;Functional mobility training;Therapeutic  activities;Therapeutic exercise;Balance training;Neuromuscular re-education;Patient/family education;Prosthetic Training    PT Next Visit Plan  PT requested wife to attend next session for education on stair negotiation, Continue Prosthetic gait with RW, weight shifting and balance activities. Work on activities reaching outside [place].     Consulted and Agree with Plan of Care  Patient       Patient will benefit from skilled therapeutic intervention in order to improve the following deficits and impairments:  Abnormal gait, Decreased activity tolerance, Decreased balance, Decreased endurance, Decreased knowledge of use of DME, Decreased mobility, Decreased range of motion, Decreased strength, Impaired flexibility, Impaired vision/preception, Postural dysfunction, Prosthetic Dependency, Pain  Visit Diagnosis: Other abnormalities of gait and mobility  Stiffness of right knee, not elsewhere classified  Muscle weakness (generalized)  Unsteadiness on feet     Problem List Patient Active Problem List   Diagnosis Date Noted  . Acquired absence of right leg below knee ([facility]) 03/13/2017  . Diabetic polyneuropathy associated with type 1 diabetes mellitus ([facility])   . Diabetes mellitus, new onset ([facility])   . Acute  renal failure ([place])   . Acute kidney injury ([facility]) 02/27/2017  . Hyperglycemia 02/27/2017  . Leukocytosis 02/27/2017   Chassity Felts, SPTA 08/27/2017, 2:34 PM  Jamey Reas, PT, DPT 08/28/2017, 9:06 AM  [facility] [address] [place], [place], [address] Phone: [phone]   Fax:  [phone]  Name: Eutimio Gharibian MRN: [id number] Date of Birth: 28-Jul-1951

## 2017-08-28 ENCOUNTER — Ambulatory Visit: Payer: BC Managed Care – PPO | Admitting: Physical Therapy

## 2017-08-28 DIAGNOSIS — M25661 Stiffness of right knee, not elsewhere classified: Secondary | ICD-10-CM | POA: Diagnosis not present

## 2017-08-28 DIAGNOSIS — R296 Repeated falls: Secondary | ICD-10-CM | POA: Diagnosis not present

## 2017-08-28 DIAGNOSIS — R2689 Other abnormalities of gait and mobility: Secondary | ICD-10-CM

## 2017-08-28 DIAGNOSIS — M6281 Muscle weakness (generalized): Secondary | ICD-10-CM | POA: Diagnosis not present

## 2017-08-28 DIAGNOSIS — M79604 Pain in right leg: Secondary | ICD-10-CM | POA: Diagnosis not present

## 2017-08-28 DIAGNOSIS — R2681 Unsteadiness on feet: Secondary | ICD-10-CM

## 2017-08-28 NOTE — Therapy (Signed)
[facility] [address] [place], [place], [address] Phone: [phone]   Fax:  [phone]  Physical Therapy Treatment  Patient Details  Name: Samuel Roth MRN: [id number] Date of Birth: May 03, 1951 Referring Provider: Meridee Score, MD   Encounter Date: 08/28/2017  PT End of Session - 08/28/17 1101    Visit Number  7    Number of Visits  17    Date for PT Re-Evaluation  10/04/17    Authorization Type  BCBS state primary & Medicare 2nd - G-code & progress note every 10th visit    Authorization Time Period  $1250 deductable met; 0 visit limit, no auth    PT Start Time  0930    PT Stop Time  1015    PT Time Calculation (min)  45 min    Equipment Utilized During Treatment  Gait belt    Activity Tolerance  Patient tolerated treatment well       Past Medical History:  Diagnosis Date  . Glaucoma   . Legally blind     Past Surgical History:  Procedure Laterality Date  . AMPUTATION Right 03/01/2017   Procedure: RIGHT TRANS METATARSAL AMPUTATION;  Surgeon: Newt Minion, MD;  Location: [facility];  Service: Orthopedics;  Laterality: Right;  . AMPUTATION Right 03/03/2017   Procedure: RIGHT BKA;  Surgeon: Newt Minion, MD;  Location: [facility];  Service: Orthopedics;  Laterality: Right;  . EYE SURGERY    . TIBIA FRACTURE SURGERY      There were no vitals filed for this visit.  Subjective Assessment - 08/27/17 1103    Subjective  Pt complaints of breakout on skin under prosthesis, no falls to report.     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  Yes    Pain Score  3     Pain Location  Knee    Pain Orientation  Left    Pain Type  Chronic pain        [facility] Adult PT Treatment/Exercise - 08/28/17 0001      Ambulation/Gait   Ambulation/Gait  Yes    Ambulation/Gait Assistance  4: Min guard;5: Supervision Initially Min guard & progressed to supervision    Ambulation/Gait Assistance Details  Pt ambulating with RW, min guard for safety with LOB x1 which pt self-corrected. VC's for longer step length with LLE, increased knee lift on Right, and improve upright posture.     Ambulation Distance (Feet)  345 Feet    Assistive device  Rolling walker    Gait Pattern  Trunk flexed;Step-through pattern;Decreased step length - left    Stairs  Yes    Stairs Assistance  4: Min guard;5: Supervision progressed to supervision for last rep with verbal cues    Stairs Assistance Details (indicate cue type and reason)  Pt performing step to pattern with cane. VC's for cane/foot placement. Educated spouse to provide [place] for safe stair negotiation.    Stair Management Technique  One rail Left;With cane;Forwards;Step to pattern    Number of Stairs  4 x6    Height of Stairs  6    Ramp  4: Min assist    Ramp Details (indicate cue type and reason)  Cues to weight shift anterior over prosthetic foot with toe pressure ascending ramp and heel pressure descending ramp to avoid fall risk.      Neuro Re-ed    Neuro Re-ed Details   Pt standing in // bars with Min guard for safety and no UE support. Pt performed balance activities with wide and narrow BOS and with directional head turns to address balance deficits and improve weight shift.      Pt performed corner balance exercises with chair in front and Min guard for safety. Pt performed weight shifting exercises and reaching outside of BOS with no AD and no UE support.       Prosthetics   Prosthetic Care Comments   Assessed skin breakdown under prosthesis, applied Tegaderm to area and discussed with pt the importance of on/off time and replacing Tegaderm bandaging until small wound has healed in order to  decrease sweat in the area and allow wound to heal.      Current prosthetic wear tolerance (days/week)   daily    Current prosthetic wear tolerance (#hours/day)   6+ hours/day PT cued 4-5hrs 2x/day to control sweat better    Edema  none    Residual limb condition   medial knee with superficial skin breakdown that appears to be blister that broke. No signs of infection noted.     Education Provided  Skin check;Residual limb care;Correct ply sock adjustment;Proper Donning;Proper wear schedule/adjustment;Other (comment) see prosthetic care comments    Person(s) Educated  Patient;Spouse    Education Method  Explanation;Demonstration;Verbal cues    Education Method  Verbalized understanding;Verbal cues required        PT Education - 08/28/17 1126    Education provided  Yes    Education Details  Discussed prosthesis wear time with pt who returned instruction of 5 hrs/ remove afte 2.5 hrs to wipe off excess sweat/ reapply, leave off for 2 hrs., reapply for another 5 hrs. with same instruction. Discussed use of Tegaderm bandage to help heal present wound.    Person(s) Educated  Patient;Spouse    Methods  Explanation    Comprehension  Verbalized understanding       PT Short Term Goals - 08/20/17 1558      PT SHORT TERM GOAL #1   Title  Patient verbalizes proper cleaning & demonstrates proper donning of prosthesis (All STGs Target Date 09/06/17)    Time  4    Period  Weeks    Status  On-going    Target Date  09/06/17      PT SHORT TERM GOAL #2   Title  Patient tolerates wear of prosthesis >8 hrs total /day without skin issues.     Time  4    Period  Weeks    Status  On-going    Target Date  09/06/17      PT SHORT TERM GOAL #3   Title  Patient ambulates 300' with RW outdoors including ramps & curbs with supervision.     Time  4    Period  Weeks    Status  On-going    Target Date  09/06/17      PT SHORT TERM GOAL #4   Title  Patient ambulates 150' with cane & prosthesis with minA.      Time  4    Period  Weeks    Status  On-going    Target Date  09/06/17      PT SHORT TERM GOAL #5   Title  Patient stands with prosthesis & no UE support reaching 5" & reaches towards floor to knee level with supervision.    Time  4    Period  Weeks    Status  On-going    Target Date  09/06/17        PT Long Term Goals - 08/20/17 1558      PT LONG TERM GOAL #1   Title  Patient demonstrates & verbalizes proper prosthetic care to enable safe prosthesis use. (All LTGs Target Date 10/04/17)    Time  8    Period  Weeks    Status  On-going    Target Date  10/04/17      PT LONG TERM GOAL #2   Title  Patient tolerates prosthesis wear >90% of awake hours without skin issues or limb pain to enable function throughout his day.     Time  8    Period  Weeks    Status  On-going    Target Date  10/04/17      PT LONG TERM GOAL #3   Title  Berg Balance >36/56 to indicate lower fall risk.     Time  8    Period  Weeks    Status  On-going    Target Date  10/04/17      PT LONG TERM GOAL #4   Title  Patient ambulates around furniture with prosthesis only with assist for vision (reports independent in his apt).     Time  8    Period  Weeks    Status  On-going    Target Date  10/04/17      PT LONG TERM GOAL #5   Title  Patient ambulates 500' with cane & prosthesis with supervision for vision without balance loss for community mobility.     Time  8    Period  Weeks    Status  On-going    Target Date  10/04/17      PT LONG TERM GOAL #6   Title  Patient negotiates stairs with 1 rail, ramps & curbs with cane & prosthesis with supervision for vision for community access.     Time  8    Period  Weeks    Status  On-going    Target Date  10/04/17      PT LONG TERM GOAL #7   Title  Patient demonstrates work simulation tasks including lifting and reaching with prosthesis safely.     Time  8    Period  Weeks    Status  On-going    Target Date  10/04/17      PT LONG TERM GOAL #8    Title  Patient reports limb pain </= 2/10 with standing activities.     Time  8    Period  Weeks    Status  On-going    Target Date  10/04/17       Plan - 08/28/17 1128    Clinical Impression Statement  Session focused on prosthetic gait training and balance exercises. Pt wound on Right residual limb is healing, and patiend was given further education on proper prosthesis wear and care. Pt tolerated treatment well and will benefit from continue PT to progress toward goals.    Rehab Potential  Good    Clinical Impairments Affecting Rehab Potential  right TTA, DM1, neuropathy, acute renal failure, COPD, glaucoma, legally blind, HTN,     PT Frequency  2x / week    PT Duration  8 weeks    PT Treatment/Interventions  ADLs/Self Care Home Management;DME Instruction;Gait training;Stair training;Functional mobility training;Therapeutic activities;Therapeutic exercise;Balance training;Neuromuscular re-education;Patient/family education;Prosthetic Training    PT Next Visit Plan  Review stair negotiation with cane, prosthetic gait training with RW, balance activities.      Consulted and Agree with Plan of Care  Patient    Family Member Consulted  wife       Patient will benefit from skilled therapeutic intervention in order to improve the following deficits and impairments:  Abnormal gait, Decreased activity tolerance, Decreased balance, Decreased endurance, Decreased knowledge of use of DME, Decreased mobility, Decreased range of motion, Decreased strength, Impaired flexibility, Impaired vision/preception, Postural dysfunction, Prosthetic Dependency, Pain  Visit Diagnosis: Other abnormalities of gait and mobility  Stiffness of right knee, not elsewhere classified  Muscle weakness (generalized)  Unsteadiness on feet     Problem List Patient Active Problem List   Diagnosis Date Noted  . Acquired absence of right leg below knee ([facility]) 03/13/2017  . 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 Diabetes mellitus, new onset ([facility])   . Acute renal failure ([facility])   . Acute kidney injury ([facility]) 02/27/2017  . Hyperglycemia 02/27/2017  . Leukocytosis 02/27/2017    Andria Meuse, SPTA 08/28/2017, 11:33 AM  [facility] [address] [place], [place], [address] Phone: [phone]   Fax:  [phone]  Name: Bricyn Labrada MRN: [id number] Date of Birth: [birth year]

## 2017-09-03 ENCOUNTER — Ambulatory Visit: Payer: BC Managed Care – PPO | Admitting: Physical Therapy

## 2017-09-03 ENCOUNTER — Encounter: Payer: Self-pay | Admitting: Physical Therapy

## 2017-09-03 DIAGNOSIS — M79604 Pain in right leg: Secondary | ICD-10-CM | POA: Diagnosis not present

## 2017-09-03 DIAGNOSIS — R296 Repeated falls: Secondary | ICD-10-CM | POA: Diagnosis not present

## 2017-09-03 DIAGNOSIS — R2681 Unsteadiness on feet: Secondary | ICD-10-CM | POA: Diagnosis not present

## 2017-09-03 DIAGNOSIS — M25661 Stiffness of right knee, not elsewhere classified: Secondary | ICD-10-CM

## 2017-09-03 DIAGNOSIS — M6281 Muscle weakness (generalized): Secondary | ICD-10-CM

## 2017-09-03 DIAGNOSIS — R2689 Other abnormalities of gait and mobility: Secondary | ICD-10-CM

## 2017-09-03 NOTE — Therapy (Signed)
[facility] [address] [place], [place], [address] Phone: [phone]   Fax:  [phone]  Physical Therapy Treatment  Patient Details  Name: Samuel Roth MRN: [id number] Date of Birth: April 24, 1951 Referring Provider: Meridee Score, MD   Encounter Date: 09/03/2017  PT End of Session - 09/03/17 1404    Visit Number  8    Number of Visits  17    Date for PT Re-Evaluation  10/04/17    Authorization Type  BCBS state primary & Medicare 2nd - G-code & progress note every 10th visit    Authorization Time Period  $1250 deductable met; 0 visit limit, no auth    PT Start Time  1318    PT Stop Time  1402    PT Time Calculation (min)  44 min    Equipment Utilized During Treatment  Gait belt    Activity Tolerance  Patient tolerated treatment well       Past Medical History:  Diagnosis Date  . Glaucoma   . Legally blind     Past Surgical History:  Procedure Laterality Date  . AMPUTATION Right 03/01/2017   Procedure: RIGHT TRANS METATARSAL AMPUTATION;  Surgeon: Newt Minion, MD;  Location: [facility];  Service: Orthopedics;  Laterality: Right;  . AMPUTATION Right 03/03/2017   Procedure: RIGHT BKA;  Surgeon: Newt Minion, MD;  Location: [facility];  Service: Orthopedics;  Laterality: Right;  . EYE SURGERY    . TIBIA FRACTURE SURGERY      There were no vitals filed for this visit.  Subjective Assessment - 09/03/17 1324    Subjective  Pt did not wear prosthesis sat/sun due to headache caused by poor vision. Pt has scratch medial to knee on RLE. Pt has no new complaints, no new falls to report.     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  Yes    Pain  Score  1     Pain Location  Hand    Pain Orientation  Right;Left    Pain Type  Chronic pain       [facility] Adult PT Treatment/Exercise - 09/03/17 1350      Ambulation/Gait   Ambulation/Gait  Yes    Ambulation/Gait Assistance  5: Supervision    Ambulation/Gait Assistance Details  Pt ambulated indoors/outdoors 300' with RW negotiating curbs/ramps. Pt then ambulated with straight cane indoors with Min A/Guard for safety and required cues for technique/sequencing with AD.     Ambulation Distance (Feet)  300 Feet 300' (RW), seated rest break/activity, 175' (Cane)     Assistive device  Rolling walker;Straight cane;Prosthesis    Gait Pattern  Step-through pattern;Decreased step length - left;Right hip hike    Ambulation Surface  Indoor;Level;Outdoor;Paved    Stairs  Yes    Stairs Assistance  4: Min guard;5: Supervision    Stairs Assistance Details (indicate cue type and reason)  verbal cues on cane use, sequence & wt shift    Stair Management Technique  One rail Left;With cane;Step to pattern;Forwards    Number of Stairs  4    Height of Stairs  6    Ramp  5: Supervision RW & prosthesis    Ramp Details (  indicate cue type and reason)  Supervision for safety, cues for weight shift to toes/heels to ascend/descend    Curb  5: Supervision RW & prosthesis    Curb Details (indicate cue type and reason)  Increased time with finding edge of curb to ascend/descend RW prior to initiating step technique due to visual impairment.       Dynamic Standing Balance   Dynamic Standing - Balance Support  No upper extremity supported    Dynamic Standing - Level of Assistance  5: Stand by assistance    Dynamic Standing - Comments  reaches 5" anteriorly & to knee level with supervision; PT verbal cues to flex knee to keep prosthetic foot flat with reaching to ankle level.       Self-Care   Other Self-Care Comments   --      Neuro Re-ed    Neuro Re-ed Details   Pt standing to RW reaching forward and down below knee  level with no UE support required,      Prosthetics   Prosthetic Care Comments   pt reports proper cleaning/drying of liner of prosthesis. Pt also reports to wearing prosthesis for 5hrs/2x day; PT instructed in changing shoes with same pitch with patient palpating top of foot & pylon to determine height.    Current prosthetic wear tolerance (days/week)   daily    Current prosthetic wear tolerance (#hours/day)   5 hrs 2x/day; PT instructed to increase to 6 hrs 2x/day    Residual limb condition   medial knee superficial skin breakdown from healing blister & scratch covered with Tegaderm    Education Provided  Skin check;Residual limb care;Prosthetic cleaning;Correct ply sock adjustment;Proper Donning;Proper wear schedule/adjustment;Other (comment) see prosthetic care comments    Person(s) Educated  Patient    Education Method  Explanation;Verbal cues    Education Method  Verbalized understanding;Verbal cues required;Needs further instruction       PT Education - 09/03/17 1325    Education provided  Yes    Education Details  Pt educated on wearing prosthesis outside of bed even when sick, for safety reasons. Pt also educated the instance of changing shoes including demonstration and tactile cues, shoe demonstration with prosthesis on how to determine if the shoe is too tall or too flat on the bottom.     Methods  Explanation;Demonstration    Comprehension  Verbalized understanding       PT Short Term Goals - 09/03/17 1524      PT SHORT TERM GOAL #1   Title  Patient verbalizes proper cleaning & demonstrates proper donning of prosthesis (All STGs Target Date 09/06/17)    Baseline  11/27: Pt verbalized proper cleaning of prosthesis liner    Time  4    Period  Weeks    Status  On-going    Target Date  09/06/17      PT SHORT TERM GOAL #2   Title  Patient tolerates wear of prosthesis >8 hrs total /day without skin issues.     Baseline  11/27: Pt tolerates wear of prosthesis for 5hrs/2x day  without current skin issues.     Time  4    Period  Weeks    Status  Achieved    Target Date  09/06/17      PT SHORT TERM GOAL #3   Title  Patient ambulates 300' with RW outdoors including ramps & curbs with supervision.     Baseline  11/27: Pt ambulated 300' with RW outdoors, Supervision  for safety.    Time  4    Period  Weeks    Status  Achieved    Target Date  09/06/17      PT SHORT TERM GOAL #4   Title  Patient ambulates 150' with cane & prosthesis with minA.     Baseline  11/27: Pt ambulated 175' with cane/prosthesis, Min A/Guard.     Time  4    Period  Weeks    Status  Achieved    Target Date  09/06/17      PT SHORT TERM GOAL #5   Title  Patient stands with prosthesis & no UE support reaching 5" & reaches towards floor to knee level with supervision.    Baseline  Pt able to stand with no UE support/reach forward 5" and down below knee level with supervision.     Time  4    Period  Weeks    Status  Achieved    Target Date  09/06/17        PT Long Term Goals - 08/20/17 1558      PT LONG TERM GOAL #1   Title  Patient demonstrates & verbalizes proper prosthetic care to enable safe prosthesis use. (All LTGs Target Date 10/04/17)    Time  8    Period  Weeks    Status  On-going    Target Date  10/04/17      PT LONG TERM GOAL #2   Title  Patient tolerates prosthesis wear >90% of awake hours without skin issues or limb pain to enable function throughout his day.     Time  8    Period  Weeks    Status  On-going    Target Date  10/04/17      PT LONG TERM GOAL #3   Title  Berg Balance >36/56 to indicate lower fall risk.     Time  8    Period  Weeks    Status  On-going    Target Date  10/04/17      PT LONG TERM GOAL #4   Title  Patient ambulates around furniture with prosthesis only with assist for vision (reports independent in his apt).     Time  8    Period  Weeks    Status  On-going    Target Date  10/04/17      PT LONG TERM GOAL #5   Title  Patient  ambulates 500' with cane & prosthesis with supervision for vision without balance loss for community mobility.     Time  8    Period  Weeks    Status  On-going    Target Date  10/04/17      PT LONG TERM GOAL #6   Title  Patient negotiates stairs with 1 rail, ramps & curbs with cane & prosthesis with supervision for vision for community access.     Time  8    Period  Weeks    Status  On-going    Target Date  10/04/17      PT LONG TERM GOAL #7   Title  Patient demonstrates work simulation tasks including lifting and reaching with prosthesis safely.     Time  8    Period  Weeks    Status  On-going    Target Date  10/04/17      PT LONG TERM GOAL #8   Title  Patient reports limb pain </= 2/10 with standing activities.  Time  8    Period  Weeks    Status  On-going    Target Date  10/04/17            Plan - 09/03/17 1547    Clinical Impression Statement  Pt tolerated treatment well with no limitations due to pain or fatigue. Todays session focused on STG's including self care, gait with prosthesis and balance training. Pt achieved 4/5 goals and would benefit from continued PT sessions to continue to progress towards goals.     Rehab Potential  Good    Clinical Impairments Affecting Rehab Potential  right TTA, DM1, neuropathy, acute renal failure, COPD, glaucoma, legally blind, HTN,     PT Frequency  2x / week    PT Duration  8 weeks    PT Treatment/Interventions  ADLs/Self Care Home Management;DME Instruction;Gait training;Stair training;Functional mobility training;Therapeutic activities;Therapeutic exercise;Balance training;Neuromuscular re-education;Patient/family education;Prosthetic Training    PT Next Visit Plan  Check remaining STG, Review stair negotiation with cane, prosthetic gait training with RW/cane, balance activities.      Consulted and Agree with Plan of Care  Patient    Family Member Consulted  wife       Patient will benefit from skilled therapeutic  intervention in order to improve the following deficits and impairments:  Abnormal gait, Decreased activity tolerance, Decreased balance, Decreased endurance, Decreased knowledge of use of DME, Decreased mobility, Decreased range of motion, Decreased strength, Impaired flexibility, Impaired vision/preception, Postural dysfunction, Prosthetic Dependency, Pain  Visit Diagnosis: Other abnormalities of gait and mobility  Stiffness of right knee, not elsewhere classified  Muscle weakness (generalized)  Unsteadiness on feet     Problem List Patient Active Problem List   Diagnosis Date Noted  . Acquired absence of right leg below knee ([facility]) 03/13/2017  . Diabetic polyneuropathy associated with type 1 diabetes mellitus ([facility])   . Diabetes mellitus, new onset ([facility])   . Acute renal failure ([facility])   . Acute kidney injury ([facility]) 02/27/2017  . Hyperglycemia 02/27/2017  . Leukocytosis 02/27/2017   Jaryah Aracena, SPTA 09/03/2017, 3:55 PM  WALDRON,ROBIN PT, DPT 09/03/2017, 9:40 PM  [facility] [address] [place], [place], [address] Phone: [phone]   Fax:  [phone]  Name: Samuel Roth MRN: [id number] Date of Birth: 04/11/1951

## 2017-09-05 ENCOUNTER — Ambulatory Visit: Payer: BC Managed Care – PPO | Admitting: Physical Therapy

## 2017-09-05 DIAGNOSIS — R2681 Unsteadiness on feet: Secondary | ICD-10-CM | POA: Diagnosis not present

## 2017-09-05 DIAGNOSIS — M25661 Stiffness of right knee, not elsewhere classified: Secondary | ICD-10-CM | POA: Diagnosis not present

## 2017-09-05 DIAGNOSIS — M79604 Pain in right leg: Secondary | ICD-10-CM | POA: Diagnosis not present

## 2017-09-05 DIAGNOSIS — R2689 Other abnormalities of gait and mobility: Secondary | ICD-10-CM

## 2017-09-05 DIAGNOSIS — R296 Repeated falls: Secondary | ICD-10-CM | POA: Diagnosis not present

## 2017-09-05 DIAGNOSIS — M6281 Muscle weakness (generalized): Secondary | ICD-10-CM | POA: Diagnosis not present

## 2017-09-05 NOTE — Therapy (Signed)
[facility] [address] [place], [place], [address] Phone: [phone]   Fax:  [phone]  Physical Therapy Treatment  Patient Details  Name: Samuel Roth MRN: [id number] Date of Birth: 12-23-1950 Referring Provider: Meridee Score, MD   Encounter Date: 09/05/2017  PT End of Session - 09/05/17 1414    Visit Number  9    Number of Visits  17    Date for PT Re-Evaluation  10/04/17    Authorization Type  BCBS state primary & Medicare 2nd - G-code & progress note every 10th visit    Authorization Time Period  $1250 deductable met; 0 visit limit, no auth    PT Start Time  1317    PT Stop Time  1401    PT Time Calculation (min)  44 min    Equipment Utilized During Treatment  Gait belt    Activity Tolerance  Patient tolerated treatment well    Behavior During Therapy  WFL for tasks assessed/performed       Past Medical History:  Diagnosis Date  . Glaucoma   . Legally blind     Past Surgical History:  Procedure Laterality Date  . AMPUTATION Right 03/01/2017   Procedure: RIGHT TRANS METATARSAL AMPUTATION;  Surgeon: Newt Minion, MD;  Location: [facility];  Service: Orthopedics;  Laterality: Right;  . AMPUTATION Right 03/03/2017   Procedure: RIGHT BKA;  Surgeon: Newt Minion, MD;  Location: [facility];  Service: Orthopedics;  Laterality: Right;  . EYE SURGERY    . TIBIA FRACTURE SURGERY      There were no vitals filed for this visit.  Subjective Assessment - 09/05/17 1321    Subjective  Pt states he is exhausted and has done a lot of work today. Pt inquired on increasing wear time of prosthesis.    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  No/denies         [facility] Adult PT  Treatment/Exercise - 09/05/17 0001      Ambulation/Gait   Ambulation/Gait  Yes    Ambulation/Gait Assistance  5: Supervision    Ambulation/Gait Assistance Details  Pt ambulated with straight cane with Min guard for safety. VC's for proper sequencing and direction.    Ambulation Distance (Feet)  150 Feet 300' (RW), seated rest break/activity, 175' (Cane)     Assistive device  Straight cane;Prosthesis    Gait Pattern  Step-to pattern;Decreased step length - left;Right hip hike;Poor foot clearance - right    Ambulation Surface  Level;Indoor    Stairs  Yes    Stairs Assistance  4: Min guard;5: Supervision    Stairs Assistance Details (indicate cue type and reason)  Step-to pattern with cane. VC's given for sequencing, weight shift, and cane use.    Stair Management Technique  One rail Left;With cane;Step to pattern;Forwards    Number of Stairs  12    Height of Stairs  6    Gait Comments  Performed gait in parallel bars using cane and no UE support or therapist assistance. Pt hiking hip on Right, and was instructed to lift R LE in a marching manner to simulate an exaggerated step. Colored circles on floor used as targets for stepping to and/or stepping over. Min guard/Min Assist for  safety, VC's given for direction.      Neuro Re-ed    Neuro Re-ed Details   Pt performing balance activities at counter with wide BOS, EO, right/left head turns with Min guard for safety. Pt more challenged with EC balance with right/left head turns. No UE support and Min Assist for safety and balance correction.      Prosthetics   Prosthetic Care Comments   Pt reports proper cleaning/drying of liner of prosthesis. Pt instructed to wear prosthesis for 6hrs/2x day and that he does not have to keep prosthesis off for 2 hours in between wear, he can do 1 hour instead.     Current prosthetic wear tolerance (days/week)   daily    Current prosthetic wear tolerance (#hours/day)   5 hrs 2x/day; PT instructed to increase to 6  hrs 2x/day    Residual limb condition   medial knee superficial skin breakdown from healing blister & scratch covered with Tegaderm    Education Provided  Skin check;Residual limb care;Prosthetic cleaning;Correct ply sock adjustment;Proper Donning;Proper wear schedule/adjustment;Other (comment) see prosthetic care comments    Person(s) Educated  Patient    Education Method  Explanation;Demonstration;Verbal cues    Education Method  Verbalized understanding;Needs further instruction    Donning Prosthesis  Supervision    Doffing Prosthesis  Independent        PT Short Term Goals - 09/05/17 1440      PT SHORT TERM GOAL #1   Title  Patient verbalizes proper cleaning & demonstrates proper donning of prosthesis (All STGs Target Date 09/06/17)    Baseline  11/27: Pt verbalized proper cleaning of prosthesis liner.   11/29: Pt demonstrated proper donning/doffing of prosthesis.    Time  4    Period  Weeks    Status  Achieved      PT SHORT TERM GOAL #2   Title  Patient tolerates wear of prosthesis >8 hrs total /day without skin issues.     Baseline  11/27: Pt tolerates wear of prosthesis for 5hrs/2x day without current skin issues.     Time  4    Period  Weeks    Status  Achieved      PT SHORT TERM GOAL #3   Title  Patient ambulates 300' with RW outdoors including ramps & curbs with supervision.     Baseline  11/27: Pt ambulated 300' with RW outdoors, Supervision for safety.    Time  4    Period  Weeks    Status  Achieved      PT SHORT TERM GOAL #4   Title  Patient ambulates 150' with cane & prosthesis with minA.     Baseline  11/27: Pt ambulated 175' with cane/prosthesis, Min A/Guard.     Time  4    Period  Weeks    Status  Achieved      PT SHORT TERM GOAL #5   Title  Patient stands with prosthesis & no UE support reaching 5" & reaches towards floor to knee level with supervision.    Baseline  Pt able to stand with no UE support/reach forward 5" and down below knee level with  supervision.     Time  4    Period  Weeks    Status  Achieved        PT Long Term Goals - 08/20/17 1558      PT LONG TERM GOAL #1   Title  Patient demonstrates & verbalizes proper prosthetic care to  enable safe prosthesis use. (All LTGs Target Date 10/04/17)    Time  8    Period  Weeks    Status  On-going    Target Date  10/04/17      PT LONG TERM GOAL #2   Title  Patient tolerates prosthesis wear >90% of awake hours without skin issues or limb pain to enable function throughout his day.     Time  8    Period  Weeks    Status  On-going    Target Date  10/04/17      PT LONG TERM GOAL #3   Title  Berg Balance >36/56 to indicate lower fall risk.     Time  8    Period  Weeks    Status  On-going    Target Date  10/04/17      PT LONG TERM GOAL #4   Title  Patient ambulates around furniture with prosthesis only with assist for vision (reports independent in his apt).     Time  8    Period  Weeks    Status  On-going    Target Date  10/04/17      PT LONG TERM GOAL #5   Title  Patient ambulates 500' with cane & prosthesis with supervision for vision without balance loss for community mobility.     Time  8    Period  Weeks    Status  On-going    Target Date  10/04/17      PT LONG TERM GOAL #6   Title  Patient negotiates stairs with 1 rail, ramps & curbs with cane & prosthesis with supervision for vision for community access.     Time  8    Period  Weeks    Status  On-going    Target Date  10/04/17      PT LONG TERM GOAL #7   Title  Patient demonstrates work simulation tasks including lifting and reaching with prosthesis safely.     Time  8    Period  Weeks    Status  On-going    Target Date  10/04/17      PT LONG TERM GOAL #8   Title  Patient reports limb pain </= 2/10 with standing activities.     Time  8    Period  Weeks    Status  On-going    Target Date  10/04/17         Plan - 09/05/17 1441    Clinical Impression Statement  All STG's have been met.  Session focused on gait training with prosthesis using cane. Pt tolerated treatment well and will continue to benefit from further PT sessions to progress toward LTG's.    Rehab Potential  Good    Clinical Impairments Affecting Rehab Potential  right TTA, DM1, neuropathy, acute renal failure, COPD, glaucoma, legally blind, HTN,     PT Frequency  2x / week    PT Duration  8 weeks    PT Treatment/Interventions  ADLs/Self Care Home Management;DME Instruction;Gait training;Stair training;Functional mobility training;Therapeutic activities;Therapeutic exercise;Balance training;Neuromuscular re-education;Patient/family education;Prosthetic Training    PT Next Visit Plan  Prosthetic gait training with cane, balance activities, SLS. Stairs negotiation with cane.    Consulted and Agree with Plan of Care  Patient    Family Member Consulted  wife       Patient will benefit from skilled therapeutic intervention in order to improve the following deficits and impairments:  Abnormal gait, Decreased  activity tolerance, Decreased balance, Decreased endurance, Decreased knowledge of use of DME, Decreased mobility, Decreased range of motion, Decreased strength, Impaired flexibility, Impaired vision/preception, Postural dysfunction, Prosthetic Dependency, Pain  Visit Diagnosis: Other abnormalities of gait and mobility  Stiffness of right knee, not elsewhere classified  Muscle weakness (generalized)  Unsteadiness on feet     Problem List Patient Active Problem List   Diagnosis Date Noted  . Acquired absence of right leg below knee ([facility]) 03/13/2017  . Diabetic polyneuropathy associated with type 1 diabetes mellitus ([facility])   . Diabetes mellitus, new onset ([facility])   . Acute renal failure ([facility] [facility])   . Acute kidney injury ([place] [facility]) 02/27/2017  . Hyperglycemia 02/27/2017  . Leukocytosis 02/27/2017    Andria Meuse, SPTA 09/05/2017, 2:45 PM  [facility] [address] [place], [place], [address] Phone: [phone]   Fax:  [phone]  Name: Yecheskel Kurek MRN: [id number] Date of Birth: [birth year]

## 2017-09-10 ENCOUNTER — Ambulatory Visit: Payer: BC Managed Care – PPO | Attending: Orthopedic Surgery | Admitting: Physical Therapy

## 2017-09-10 DIAGNOSIS — R296 Repeated falls: Secondary | ICD-10-CM | POA: Diagnosis not present

## 2017-09-10 DIAGNOSIS — M6281 Muscle weakness (generalized): Secondary | ICD-10-CM | POA: Insufficient documentation

## 2017-09-10 DIAGNOSIS — M79604 Pain in right leg: Secondary | ICD-10-CM | POA: Insufficient documentation

## 2017-09-10 DIAGNOSIS — R2681 Unsteadiness on feet: Secondary | ICD-10-CM | POA: Diagnosis not present

## 2017-09-10 DIAGNOSIS — M25661 Stiffness of right knee, not elsewhere classified: Secondary | ICD-10-CM | POA: Insufficient documentation

## 2017-09-10 DIAGNOSIS — R2689 Other abnormalities of gait and mobility: Secondary | ICD-10-CM | POA: Insufficient documentation

## 2017-09-10 NOTE — Therapy (Addendum)
[facility] [address] [place], [place], [address] Phone: [phone]   Fax:  [phone]  Physical Therapy Treatment  Patient Details  Name: Samuel Roth MRN: [id number] Date of Birth: June 03, 1951 Referring Provider: Meridee Score, MD   Encounter Date: 09/10/2017  PT End of Session - 09/10/17 1413    Visit Number  10    Number of Visits  17    Date for PT Re-Evaluation  10/04/17    Authorization Type  BCBS state primary & Medicare 2nd - G-code & progress note every 10th visit    Authorization Time Period  $1250 deductable met; 0 visit limit, no auth    PT Start Time  1318    PT Stop Time  1402    PT Time Calculation (min)  44 min    Equipment Utilized During Treatment  Gait belt    Activity Tolerance  Patient tolerated treatment well    Behavior During Therapy  WFL for tasks assessed/performed       Past Medical History:  Diagnosis Date  . Glaucoma   . Legally blind     Past Surgical History:  Procedure Laterality Date  . AMPUTATION Right 03/01/2017   Procedure: RIGHT TRANS METATARSAL AMPUTATION;  Surgeon: Newt Minion, MD;  Location: [facility];  Service: Orthopedics;  Laterality: Right;  . AMPUTATION Right 03/03/2017   Procedure: RIGHT BKA;  Surgeon: Newt Minion, MD;  Location: [facility];  Service: Orthopedics;  Laterality: Right;  . EYE SURGERY    . TIBIA FRACTURE SURGERY      There were no vitals filed for this visit.  Subjective Assessment - 09/10/17 1442    Subjective  Pt states he gets up at 2 or 3am and he has done a lot of work prior to therapy session. Pt has no complaints or pain. Pt asked for instruction to stand up from the floor.    Patient is accompained by:  Family member    Pertinent History  right TTA, DM1, neuropathy, acute renal failure, COPD, glaucoma, legally blind since birth, HTN,     Limitations  Lifting;Standing;Walking;House hold activities    Patient Stated Goals  walk with prosthesis,  work loading vending machines    Currently in Pain?  No/denies       [facility] Adult PT Treatment/Exercise - 09/10/17 0001      Ambulation/Gait   Ambulation/Gait  Yes    Ambulation/Gait Assistance  5: Supervision;4: Min guard    Ambulation/Gait Assistance Details  Pt ambulated with Min guard for safety.    Ambulation Distance (Feet)  150 Feet 300' (RW), seated rest break/activity, 175' (Cane)     Assistive device  Straight cane;Prosthesis    Gait Pattern  Step-to pattern;Decreased step length - left;Right hip hike;Poor foot clearance - right    Ambulation Surface  Level;Indoor    Stairs  Yes    Stairs Assistance  4: Min guard    Stair Management Technique  One rail Right;One rail Left;With cane;Step to pattern;Forwards    Number of Stairs  4 4 x1 with rail on right, 4 x2 with rail on left    Height of Stairs  6      Self-Care   Self-Care  Other Self-Care Comments    Other Self-Care Comments   Educated pt on floor to mat table transfer and floor to standing transfer. Verbal, visual and tactile cues given for proper technique. Pt returned demonstration and states he will practice this more  at home.      Neuro Re-ed    Neuro Re-ed Details   Pt performing balance activities at counter with step-ups to 3" bottom shelf, alternating feet x 10 each.   Pt performed in // bars with wide BOS, EO then EC, right/left head turns with Min guard for safety.    Pt performed side stepping x15 to the left and x15 to the right beginning with UE support, then without. Min Assist for safety and balance correction.  Pt performed balance exercise on rockerboard, balancing in center then weight shifting right/left, then forward/backward. Note: pt was not comfortable on the rockerboard.      Prosthetics   Prosthetic Care Comments   Pt reports proper cleaning/drying of liner of prosthesis. Pt states he is wearing prosthesis for 6hrs/2x day.    Current prosthetic wear tolerance (days/week)   daily    Current  prosthetic wear tolerance (#hours/day)   6 hrs 2x/day; PT instructed to increase to 6 hrs 2x/day    Residual limb condition   medial knee skin has healed.    Education Provided  Residual limb care;Prosthetic cleaning;Proper wear schedule/adjustment see prosthetic care comments    Person(s) Educated  Patient        PT Short Term Goals - 09/05/17 1440      PT SHORT TERM GOAL #1   Title  Patient verbalizes proper cleaning & demonstrates proper donning of prosthesis (All STGs Target Date 09/06/17)    Baseline  11/27: Pt verbalized proper cleaning of prosthesis liner.   11/29: Pt demonstrated proper donning/doffing of prosthesis.    Time  4    Period  Weeks    Status  Achieved      PT SHORT TERM GOAL #2   Title  Patient tolerates wear of prosthesis >8 hrs total /day without skin issues.     Baseline  11/27: Pt tolerates wear of prosthesis for 5hrs/2x day without current skin issues.     Time  4    Period  Weeks    Status  Achieved      PT SHORT TERM GOAL #3   Title  Patient ambulates 300' with RW outdoors including ramps & curbs with supervision.     Baseline  11/27: Pt ambulated 300' with RW outdoors, Supervision for safety.    Time  4    Period  Weeks    Status  Achieved      PT SHORT TERM GOAL #4   Title  Patient ambulates 150' with cane & prosthesis with minA.     Baseline  11/27: Pt ambulated 175' with cane/prosthesis, Min A/Guard.     Time  4    Period  Weeks    Status  Achieved      PT SHORT TERM GOAL #5   Title  Patient stands with prosthesis & no UE support reaching 5" & reaches towards floor to knee level with supervision.    Baseline  Pt able to stand with no UE support/reach forward 5" and down below knee level with supervision.     Time  4    Period  Weeks    Status  Achieved        PT Long Term Goals - 08/20/17 1558      PT LONG TERM GOAL #1   Title  Patient demonstrates & verbalizes proper prosthetic care to enable safe prosthesis use. (All LTGs Target  Date 10/04/17)    Time  8    Period  Weeks    Status  On-going    Target Date  10/04/17      PT LONG TERM GOAL #2   Title  Patient tolerates prosthesis wear >90% of awake hours without skin issues or limb pain to enable function throughout his day.     Time  8    Period  Weeks    Status  On-going    Target Date  10/04/17      PT LONG TERM GOAL #3   Title  Berg Balance >36/56 to indicate lower fall risk.     Time  8    Period  Weeks    Status  On-going    Target Date  10/04/17      PT LONG TERM GOAL #4   Title  Patient ambulates around furniture with prosthesis only with assist for vision (reports independent in his apt).     Time  8    Period  Weeks    Status  On-going    Target Date  10/04/17      PT LONG TERM GOAL #5   Title  Patient ambulates 500' with cane & prosthesis with supervision for vision without balance loss for community mobility.     Time  8    Period  Weeks    Status  On-going    Target Date  10/04/17      PT LONG TERM GOAL #6   Title  Patient negotiates stairs with 1 rail, ramps & curbs with cane & prosthesis with supervision for vision for community access.     Time  8    Period  Weeks    Status  On-going    Target Date  10/04/17      PT LONG TERM GOAL #7   Title  Patient demonstrates work simulation tasks including lifting and reaching with prosthesis safely.     Time  8    Period  Weeks    Status  On-going    Target Date  10/04/17      PT LONG TERM GOAL #8   Title  Patient reports limb pain </= 2/10 with standing activities.     Time  8    Period  Weeks    Status  On-going    Target Date  10/04/17       Plan - 09/10/17 1433    Clinical Impression Statement  Session was focused on balance and weight shifting activities, and gait training with prosthesis using cane. Pt had to be redirected multiple times throughout session in order to focus on task at hand. Pt challenged with EC balance exercises and SLS activities. Pt tolerated treatment  well and will continue to benefit from further PT sessions to progress toward goals.    Rehab Potential  Good    Clinical Impairments Affecting Rehab Potential  right TTA, DM1, neuropathy, acute renal failure, COPD, glaucoma, legally blind, HTN,     PT Frequency  2x / week    PT Duration  8 weeks    PT Treatment/Interventions  ADLs/Self Care Home Management;DME Instruction;Gait training;Stair training;Functional mobility training;Therapeutic activities;Therapeutic exercise;Balance training;Neuromuscular re-education;Patient/family education;Prosthetic Training    PT Next Visit Plan  Prosthetic gait training with cane, balance activities, SLS.     Consulted and Agree with Plan of Care  Patient       Patient will benefit from skilled therapeutic intervention in order to improve the following deficits and impairments:  Abnormal gait, Decreased activity tolerance, Decreased balance, Decreased  endurance, Decreased knowledge of use of DME, Decreased mobility, Decreased range of motion, Decreased strength, Impaired flexibility, Impaired vision/preception, Postural dysfunction, Prosthetic Dependency, Pain  Visit Diagnosis: Other abnormalities of gait and mobility  Stiffness of right knee, not elsewhere classified  Muscle weakness (generalized)  Unsteadiness on feet   G-Codes - 2017-09-18 1438    Functional Assessment Tool Used (Outpatient Only)  Patient is dependent in prosthetic care. He is wearing prosthesis ~ 6 hrs/day x2. No skin issues.    Functional Limitation  Self care       Problem List Patient Active Problem List   Diagnosis Date Noted  . Acquired absence of right leg below knee ([facility]) 03/13/2017  . Diabetic polyneuropathy associated with type 1 diabetes mellitus ([place])   . Diabetes mellitus, new onset ([facility])   . Acute renal failure ([place])   . Acute kidney injury ([facility]) 02/27/2017  . Hyperglycemia 02/27/2017  . Leukocytosis 02/27/2017    Andria Meuse, SPTA 18-Sep-2017, 2:44  PM  [facility] [address] [place], [place], [address] Phone: [phone]   Fax:  [phone]  Name: Samuel Roth MRN: [id number] Date of Birth: 1951/01/31  G-Codes - 09/18/17 1438    Functional Assessment Tool Used (Outpatient Only)  Patient is dependent in prosthetic care. He is wearing prosthesis ~ 6 hrs/day x2. No skin issues.    Functional Limitation  Self care    Self Care Current Status [phone])  At least 40 percent but less than 60 percent impaired, limited or restricted    Self Care Goal Status (R4270)  At least 1 percent but less than 20 percent impaired, limited or restricted       Physical Therapy Progress Note  Dates of Reporting Period: 08/07/2017 to 2017/09/18  Objective Reports of Subjective Statement: Patient reports increased wear & use of prosthesis under direction of PT.   Objective Measurements: see above  Goal Update: see above  Plan: continue established plan of care  Reason Skilled Services are Required: Patient requires skilled care to progress wear, use, gait & balance with prosthesis to maximize function & minimize fall risk.   Jamey Reas, PT, DPT PT Specializing in [place] 09/11/17 12:15 PM Phone:  [phone]  Fax:  [phone] [facility] [address] [place], [place] [address]

## 2017-09-12 ENCOUNTER — Ambulatory Visit: Payer: BC Managed Care – PPO | Admitting: Physical Therapy

## 2017-09-12 DIAGNOSIS — M6281 Muscle weakness (generalized): Secondary | ICD-10-CM | POA: Diagnosis not present

## 2017-09-12 DIAGNOSIS — M25661 Stiffness of right knee, not elsewhere classified: Secondary | ICD-10-CM

## 2017-09-12 DIAGNOSIS — M79604 Pain in right leg: Secondary | ICD-10-CM | POA: Diagnosis not present

## 2017-09-12 DIAGNOSIS — R2689 Other abnormalities of gait and mobility: Secondary | ICD-10-CM

## 2017-09-12 DIAGNOSIS — R296 Repeated falls: Secondary | ICD-10-CM | POA: Diagnosis not present

## 2017-09-12 DIAGNOSIS — R2681 Unsteadiness on feet: Secondary | ICD-10-CM | POA: Diagnosis not present

## 2017-09-12 IMAGING — US US RENAL
1 series · 14 of 25 positions shown · non-contrast
Comparison: CT of the abdomen and pelvis performed 08/15/2007

CLINICAL DATA: Acute onset of renal failure.  Initial encounter.

EXAM:
RENAL / URINARY TRACT ULTRASOUND COMPLETE

[Series 1: us renal · 0.28mm/px · 14 of 34 slices shown]
[im 1/34]
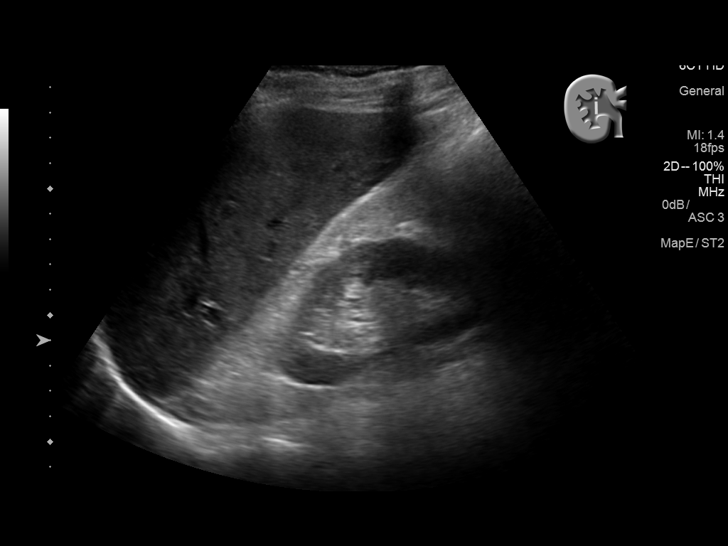
[im 3/34]
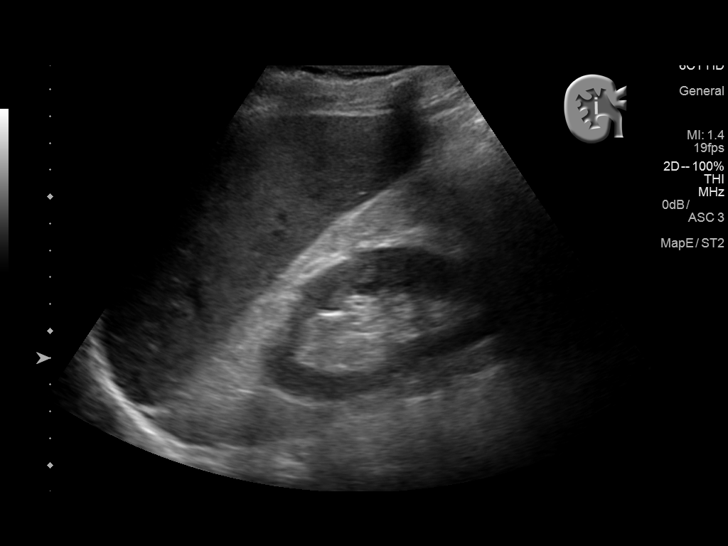
[im 6/34]
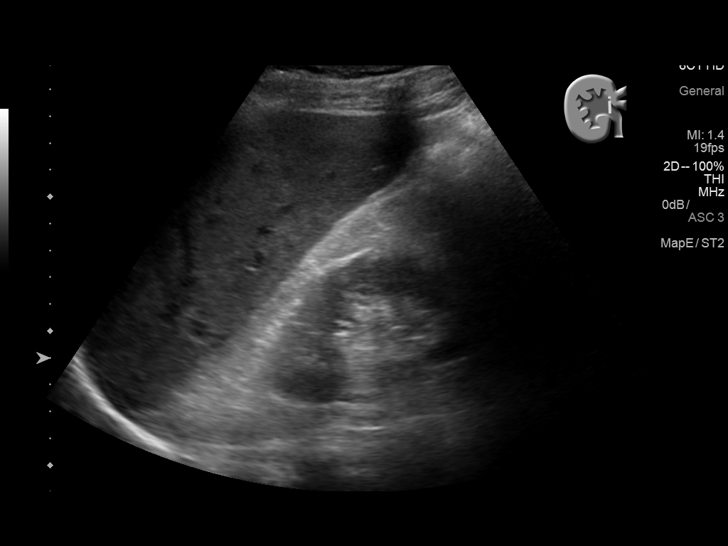
[im 9/34]
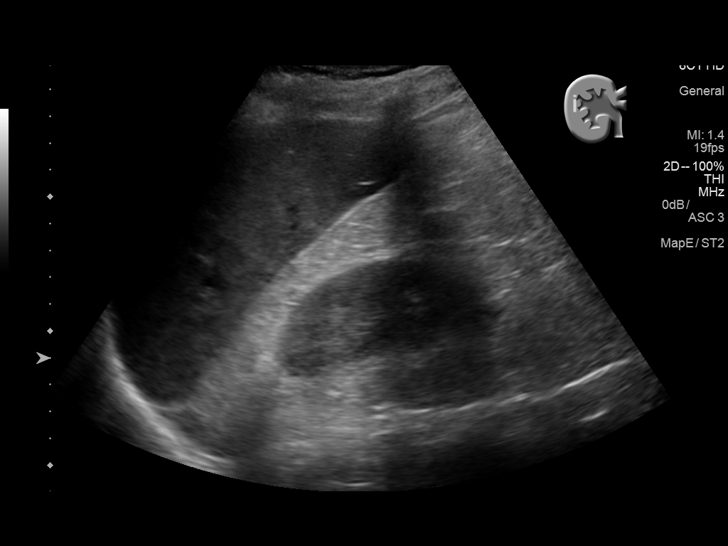
[im 12/34]
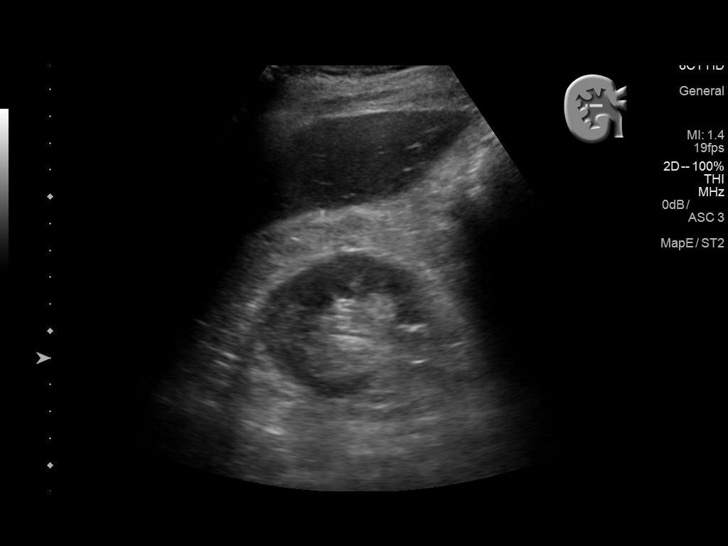
[im 13/34]
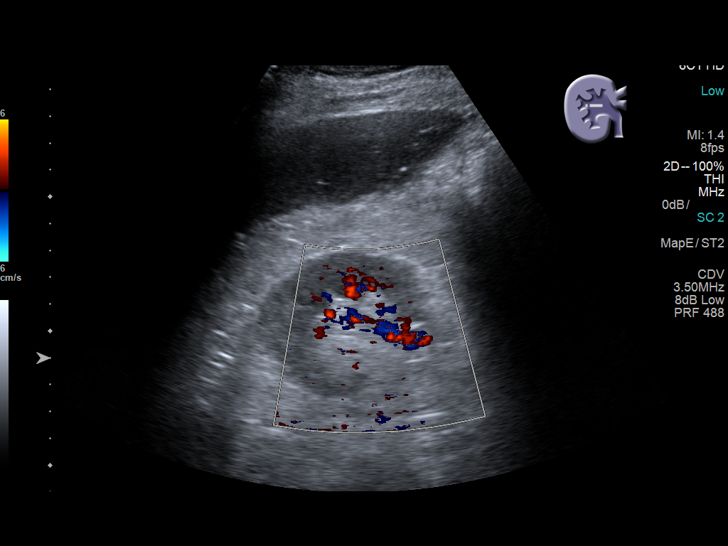
[im 16/34]
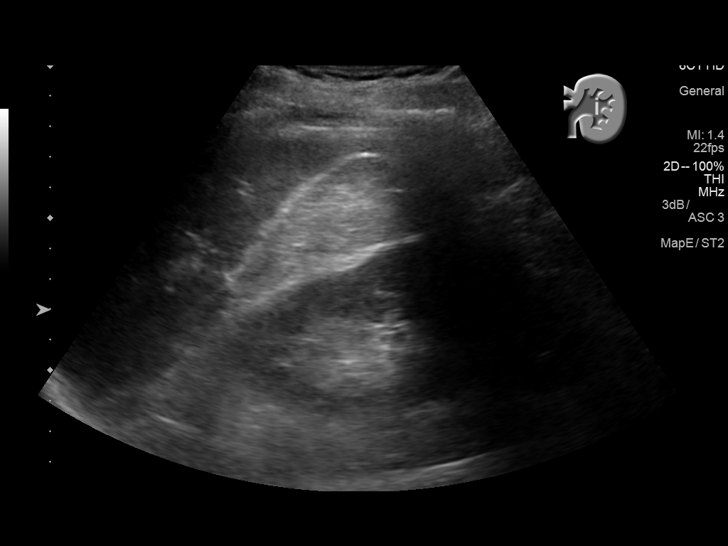
[im 18/34]
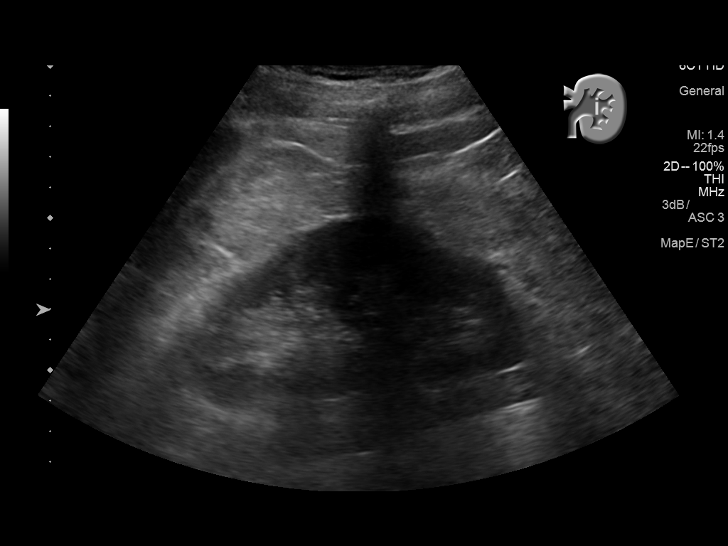
[im 21/34]
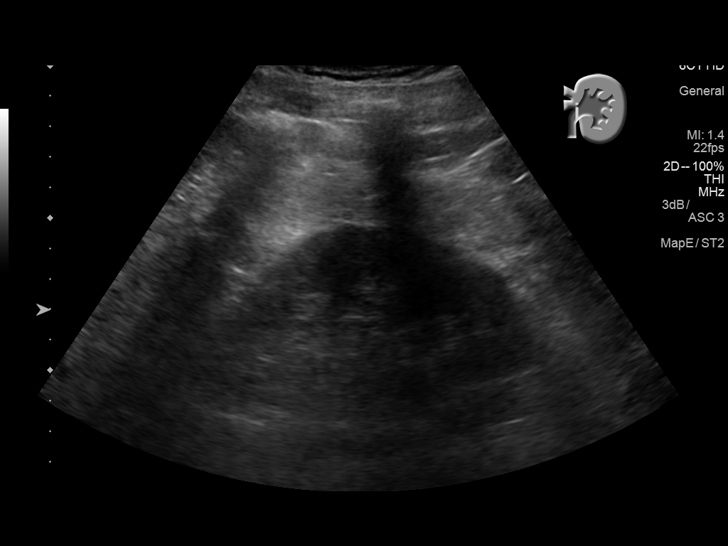
[im 23/34]
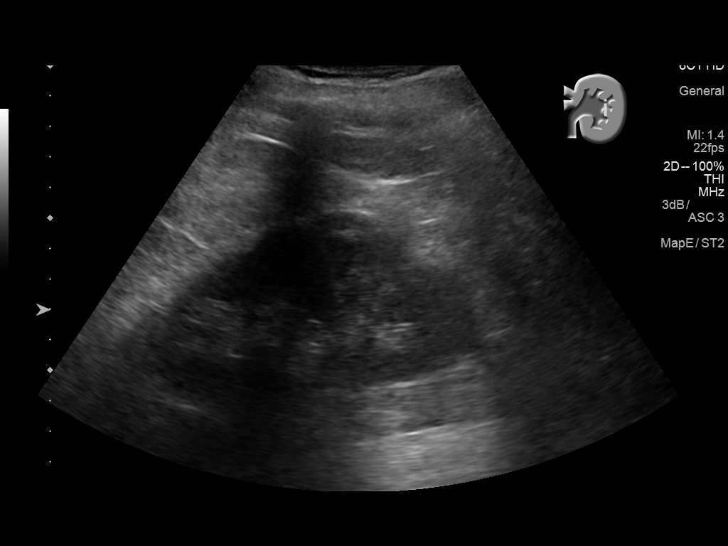
[im 25/34]
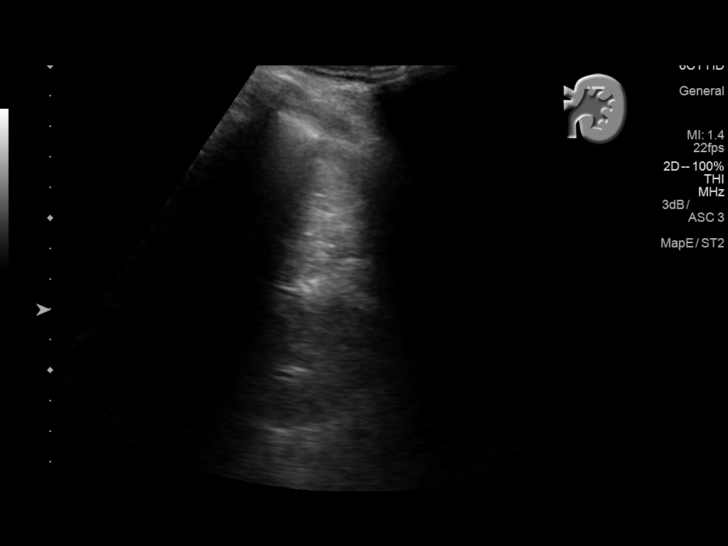
[im 28/34]
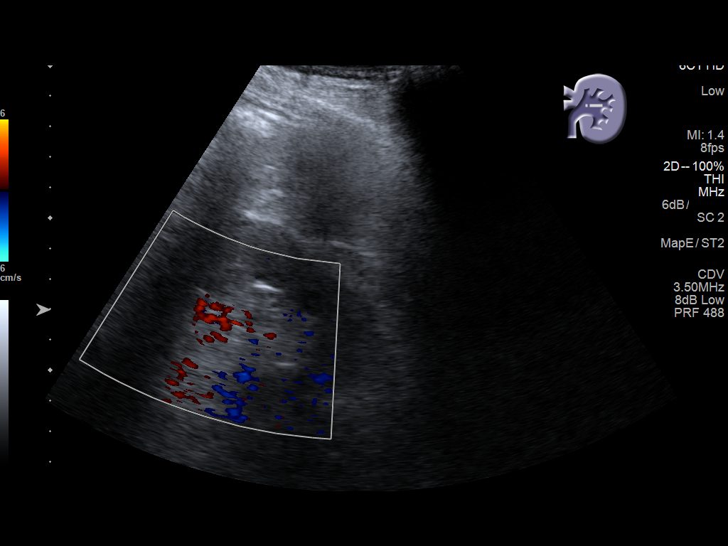
[im 31/34]
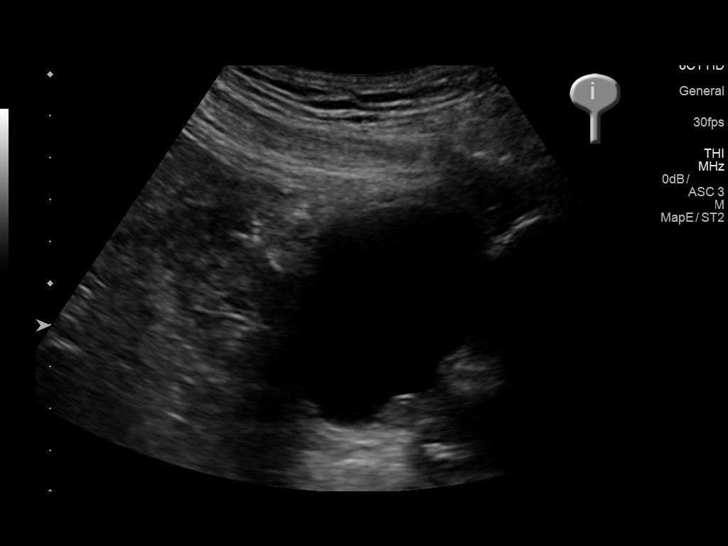
[im 34/34]
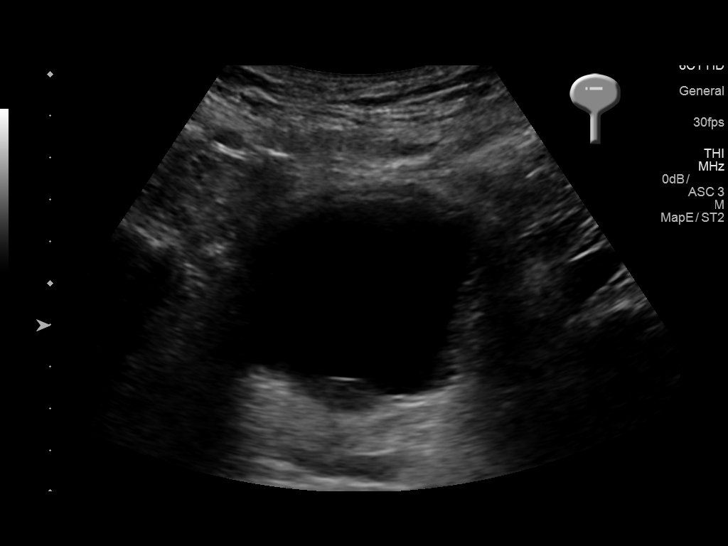

[14 of 25 positions shown; findings below may reference images not displayed]

FINDINGS: Right Kidney:

Length: 9.2 cm. Echogenicity within normal limits. No mass or
hydronephrosis visualized.

Left Kidney:

Length: 11.0 cm. Echogenicity within normal limits. No mass or
hydronephrosis visualized.

Bladder:

Appears normal for degree of bladder distention.
IMPRESSION: Unremarkable renal ultrasound.  No evidence of hydronephrosis.

## 2017-09-12 NOTE — Therapy (Signed)
[facility] [address] [place], [place], [address] Phone: [phone]   Fax:  [phone]  Physical Therapy Treatment  Patient Details  Name: Samuel Roth MRN: [id number] Date of Birth: 1950/10/19 Referring Provider: Meridee Score, MD   Encounter Date: 09/12/2017  PT End of Session - 09/12/17 1154    Visit Number  11    Number of Visits  17    Date for PT Re-Evaluation  10/04/17    Authorization Type  BCBS state primary & Medicare 2nd - G-code & progress note every 10th visit    Authorization Time Period  $1250 deductable met; 0 visit limit, no auth    PT Start Time  1103    PT Stop Time  1146    PT Time Calculation (min)  43 min    Equipment Utilized During Treatment  Gait belt    Activity Tolerance  Patient tolerated treatment well    Behavior During Therapy  WFL for tasks assessed/performed       Past Medical History:  Diagnosis Date  . Glaucoma   . Legally blind     Past Surgical History:  Procedure Laterality Date  . AMPUTATION Right 03/01/2017   Procedure: RIGHT TRANS METATARSAL AMPUTATION;  Surgeon: Newt Minion, MD;  Location: [facility];  Service: Orthopedics;  Laterality: Right;  . AMPUTATION Right 03/03/2017   Procedure: RIGHT BKA;  Surgeon: Newt Minion, MD;  Location: [facility];  Service: Orthopedics;  Laterality: Right;  . EYE SURGERY    . TIBIA FRACTURE SURGERY      There were no vitals filed for this visit.  Subjective Assessment - 09/12/17 1106    Subjective  No complaints, no falls, no pain to report. Pt asked that we looke at his skin under his liner.    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  No/denies         [facility] Adult PT Treatment/Exercise - 09/12/17 0001      Ambulation/Gait   Ambulation/Gait  Yes    Ambulation/Gait Assistance  4: Min guard;3: Mod assist;4: Min assist Min guard indoor, [place] grass/gravel, [place] paved outdoors    Ambulation/Gait Assistance Details  Verbal cues for upright posture. Patient has right pelvic drop with left knee flexion as prosthesis appears too short. PT set-up appointment with prosthetist this afternoon.  Manual & verbal cues on balance & technique on grass & outdoor paved surfaces.     Ambulation Distance (Feet)  400 Feet 200' indoor + 200' outdoors    Assistive device  Straight cane;Prosthesis    Gait Pattern  Decreased step length - left;Right hip hike;Step-through pattern;Decreased arm swing - right;Decreased stance time - right;Decreased stride length;Left flexed knee in stance;Antalgic    Ambulation Surface  Level;Indoor;Unlevel;Outdoor;Gravel;Grass;Paved    Stairs  Yes    Stairs Assistance  5: Supervision    Stairs Assistance Details (indicate cue type and reason)  Pt negotiated stairs with 2 rails with Supervision and step-to pattern, then handrail right and cane/handrail left and cane. Pt performed with proper sequencing. VC's to stand closer to rail.    Stair Management Technique  One rail Right;One rail Left;Two rails;With cane;Step to pattern;Forwards  Number of Stairs  4 x4 reps    Height of Stairs  6    Ramp  4: Min assist cane & prosthesis    Ramp Details (indicate cue type and reason)  Min Assist for safety on outside ramp on sidewalk.    Curb  4: Min assist cane & prosthesis    Curb Details (indicate cue type and reason)  Increased time with finding edge of curb to ascend /descend with cane prior to initiating step technique due to visual impairment.    Gait Comments  Pt ambulated 350 ft in gym, then went outdoors and walked through gravel, grass, up/down curb, and up/down ramp to improve functional mobility and independence in the community.  Min guard to [employer] provided for safety.      Dynamic Standing Balance   Dynamic Standing - Comments  Upward reaches to left then right x5 each side. VC's needed to shift weight from one LE to the other while reaching.      Self-Care   Self-Care  --      Neuro Re-ed    Neuro Re-ed Details   Pt performed side stepping x15 each way with no UE support. Pt performed balance activities with wide then narrow BOS with directional head turns; EO and EC.  Min guard to Min assist given for safety and balance correction.       Prosthetics   Prosthetic Care Comments   Pt noted to be short on prosthetic side. Referred to prosthetist for adjustment.    Current prosthetic wear tolerance (days/week)   daily    Current prosthetic wear tolerance (#hours/day)   6 hrs 2x/day; PT instructed to increase to 6 hrs 2x/day    Residual limb condition   medial knee superficial skin breakdown with blister - applied a new Tegaderm bandage. Pt to consult with prosthetist regarding improper fit causing skin breakdown.    Education Provided  Residual limb care;Prosthetic cleaning;Proper Donning;Proper wear schedule/adjustment    Person(s) Educated  Patient    Education Method  Explanation;Demonstration    Education Method  Verbalized understanding;Needs further instruction       PT Short Term Goals - 09/05/17 1440      PT SHORT TERM GOAL #1   Title  Patient verbalizes proper cleaning & demonstrates proper donning of prosthesis (All STGs Target Date 09/06/17)    Baseline  11/27: Pt verbalized proper cleaning of prosthesis liner.   11/29: Pt demonstrated proper donning/doffing of prosthesis.    Time  4    Period  Weeks    Status  Achieved      PT SHORT TERM GOAL #2   Title  Patient tolerates wear of prosthesis >8 hrs total /day without skin issues.     Baseline  11/27: Pt tolerates wear of prosthesis for 5hrs/2x day without current skin issues.     Time  4    Period  Weeks    Status  Achieved      PT SHORT  TERM GOAL #3   Title  Patient ambulates 300' with RW outdoors including ramps & curbs with supervision.     Baseline  11/27: Pt ambulated 300' with RW outdoors, Supervision for safety.    Time  4    Period  Weeks    Status  Achieved      PT SHORT TERM GOAL #4   Title  Patient ambulates 150' with cane & prosthesis with minA.     Baseline  11/27: Pt  ambulated 175' with cane/prosthesis, Min A/Guard.     Time  4    Period  Weeks    Status  Achieved      PT SHORT TERM GOAL #5   Title  Patient stands with prosthesis & no UE support reaching 5" & reaches towards floor to knee level with supervision.    Baseline  Pt able to stand with no UE support/reach forward 5" and down below knee level with supervision.     Time  4    Period  Weeks    Status  Achieved        PT Long Term Goals - 08/20/17 1558      PT LONG TERM GOAL #1   Title  Patient demonstrates & verbalizes proper prosthetic care to enable safe prosthesis use. (All LTGs Target Date 10/04/17)    Time  8    Period  Weeks    Status  On-going    Target Date  10/04/17      PT LONG TERM GOAL #2   Title  Patient tolerates prosthesis wear >90% of awake hours without skin issues or limb pain to enable function throughout his day.     Time  8    Period  Weeks    Status  On-going    Target Date  10/04/17      PT LONG TERM GOAL #3   Title  Berg Balance >36/56 to indicate lower fall risk.     Time  8    Period  Weeks    Status  On-going    Target Date  10/04/17      PT LONG TERM GOAL #4   Title  Patient ambulates around furniture with prosthesis only with assist for vision (reports independent in his apt).     Time  8    Period  Weeks    Status  On-going    Target Date  10/04/17      PT LONG TERM GOAL #5   Title  Patient ambulates 500' with cane & prosthesis with supervision for vision without balance loss for community mobility.     Time  8    Period  Weeks    Status  On-going    Target Date  10/04/17      PT LONG  TERM GOAL #6   Title  Patient negotiates stairs with 1 rail, ramps & curbs with cane & prosthesis with supervision for vision for community access.     Time  8    Period  Weeks    Status  On-going    Target Date  10/04/17      PT LONG TERM GOAL #7   Title  Patient demonstrates work simulation tasks including lifting and reaching with prosthesis safely.     Time  8    Period  Weeks    Status  On-going    Target Date  10/04/17      PT LONG TERM GOAL #8   Title  Patient reports limb pain </= 2/10 with standing activities.     Time  8    Period  Weeks    Status  On-going    Target Date  10/04/17       Plan - 09/12/17 1418    Clinical Impression Statement  Pt was referred to prosthetist to adjust height/fit of prosthetic limb. Pt to visit prosthetist after today's session. Session focused on ambulating using cane for safe household use, and beginning training  for cane on level and unlevel outdoor surfaces to safely ambulate in the community. Pt tolerated treatment well and will continue to benefit from further PT sessions to progress toward goals.     Rehab Potential  Good    Clinical Impairments Affecting Rehab Potential  right TTA, DM1, neuropathy, acute renal failure, COPD, glaucoma, legally blind, HTN,     PT Frequency  2x / week    PT Duration  8 weeks    PT Treatment/Interventions  ADLs/Self Care Home Management;DME Instruction;Gait training;Stair training;Functional mobility training;Therapeutic activities;Therapeutic exercise;Balance training;Neuromuscular re-education;Patient/family education;Prosthetic Training    PT Next Visit Plan  Prosthetic gait training with cane, balance activities, SLS.     Consulted and Agree with Plan of Care  Patient    Family Member Consulted  wife       Patient will benefit from skilled therapeutic intervention in order to improve the following deficits and impairments:  Abnormal gait, Decreased activity tolerance, Decreased balance, Decreased  endurance, Decreased knowledge of use of DME, Decreased mobility, Decreased range of motion, Decreased strength, Impaired flexibility, Impaired vision/preception, Postural dysfunction, Prosthetic Dependency, Pain  Visit Diagnosis: Other abnormalities of gait and mobility  Stiffness of right knee, not elsewhere classified  Muscle weakness (generalized)  Unsteadiness on feet     Problem List Patient Active Problem List   Diagnosis Date Noted  . Acquired absence of right leg below knee ([facility]) 03/13/2017  . Diabetic polyneuropathy associated with type 1 diabetes mellitus ([facility] [place])   . Diabetes mellitus, new onset ([facility])   . Acute renal failure ([facility])   . Acute kidney injury ([facility]) 02/27/2017  . Hyperglycemia 02/27/2017  . Leukocytosis 02/27/2017    Andria Meuse, SPTA 09/12/2017, 2:28 PM  Jamey Reas, PT, DPT 09/12/2017, 6:21 PM  [facility] [address] [place], [place], [address] Phone: [phone]   Fax:  [phone]  Name: Samuel Roth MRN: [id number] Date of Birth: 1951-03-26

## 2017-09-17 ENCOUNTER — Ambulatory Visit: Payer: BC Managed Care – PPO | Admitting: Physical Therapy

## 2017-09-19 ENCOUNTER — Ambulatory Visit: Payer: BC Managed Care – PPO | Admitting: Physical Therapy

## 2017-09-24 ENCOUNTER — Encounter: Payer: Self-pay | Admitting: Physical Therapy

## 2017-09-24 ENCOUNTER — Ambulatory Visit: Payer: BC Managed Care – PPO | Admitting: Physical Therapy

## 2017-09-24 DIAGNOSIS — R2681 Unsteadiness on feet: Secondary | ICD-10-CM | POA: Diagnosis not present

## 2017-09-24 DIAGNOSIS — M25661 Stiffness of right knee, not elsewhere classified: Secondary | ICD-10-CM | POA: Diagnosis not present

## 2017-09-24 DIAGNOSIS — R2689 Other abnormalities of gait and mobility: Secondary | ICD-10-CM

## 2017-09-24 DIAGNOSIS — M6281 Muscle weakness (generalized): Secondary | ICD-10-CM | POA: Diagnosis not present

## 2017-09-24 DIAGNOSIS — R296 Repeated falls: Secondary | ICD-10-CM | POA: Diagnosis not present

## 2017-09-24 DIAGNOSIS — M79604 Pain in right leg: Secondary | ICD-10-CM | POA: Diagnosis not present

## 2017-09-24 NOTE — Therapy (Signed)
[facility] [address] [place], [place], [address] Phone: [phone]   Fax:  [phone]  Physical Therapy Treatment  Patient Details  Name: Samuel Roth MRN: [id number] Date of Birth: 12-07-1950 Referring Provider: Meridee Score, MD   Encounter Date: 09/24/2017  PT End of Session - 09/24/17 1557    Visit Number  12    Number of Visits  17    Date for PT Re-Evaluation  10/04/17    Authorization Type  BCBS state primary & Medicare 2nd - G-code & progress note every 10th visit    Authorization Time Period  $1250 deductable met; 0 visit limit, no auth    PT Start Time  1100    PT Stop Time  1145    PT Time Calculation (min)  45 min    Equipment Utilized During Treatment  Gait belt    Activity Tolerance  Patient tolerated treatment well    Behavior During Therapy  WFL for tasks assessed/performed       Past Medical History:  Diagnosis Date  . Glaucoma   . Legally blind     Past Surgical History:  Procedure Laterality Date  . AMPUTATION Right 03/01/2017   Procedure: RIGHT TRANS METATARSAL AMPUTATION;  Surgeon: Newt Minion, MD;  Location: [facility];  Service: Orthopedics;  Laterality: Right;  . AMPUTATION Right 03/03/2017   Procedure: RIGHT BKA;  Surgeon: Newt Minion, MD;  Location: [facility];  Service: Orthopedics;  Laterality: Right;  . EYE SURGERY    . TIBIA FRACTURE SURGERY      There were no vitals filed for this visit.  Subjective Assessment - 09/24/17 1103    Subjective  He sawa prosthetist who added pads to socket. He had him remove the distal pads and add a proximal pad.     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  No/denies                      [facility]  Adult PT Treatment/Exercise - 09/24/17 1100      Ambulation/Gait   Ambulation/Gait  Yes    Ambulation/Gait Assistance  5: Supervision;4: Min guard Min guard cane & supervision rollator    Ambulation/Gait Assistance Details  PT instructed in safety with rollator walker use. 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    Cane gait with verbal cues on knee flexion in swing on prosthetic side.     Ambulation Distance (Feet)  400 Feet    Assistive device  Straight cane;Prosthesis;Rollator    Ambulation Surface  Indoor;Level    Ramp  5: Supervision rollator walker & prosthesis    Ramp Details (indicate cue type and reason)  verbal cues on technique    Curb  5: Supervision rollator & prosthesis    Curb Details (indicate cue type and reason)  verbal cues on safety with rollator      High Level Balance   High Level Balance Activities  Side stepping;Backward walking cane & prosthesis    High Level Balance Comments  Min guard & tactile/verbal cues on balance reaction & technique      Self-Care   Self-Care  Lifting    Lifting  Picking up object from floor with verbal cues on technique with prosthesis      Knee/Hip Exercises: Aerobic   Nustep  Level 5 with BUEs & BLEs 10 min with verbal cues on technique & set-up      Prosthetics   Prosthetic Care Comments   use of cutoff sock distal limb for volume changes distally.     Current prosthetic wear tolerance (days/week)   daily    Current prosthetic wear tolerance (#hours/day)   6 hrs 2x/day; PT instructed to increase to 7 hrs 2x/day    Residual limb condition   medial knee blister has healed but has red fragile skin so PT covered with Tegaderm    Education Provided  Skin check;Residual limb care;Proper wear schedule/adjustment    Person(s) Educated  Patient    Education Method  Explanation;Verbal cues    Education Method  Verbalized understanding;Verbal cues required;Needs  further instruction    Donning Prosthesis  Supervision    Doffing Prosthesis  Modified independent (device/increased time)               PT Short Term Goals - 09/05/17 1440      PT SHORT TERM GOAL #1   Title  Patient verbalizes proper cleaning & demonstrates proper donning of prosthesis (All STGs Target Date 09/06/17)    Baseline  11/27: Pt verbalized proper cleaning of prosthesis liner.   11/29: Pt demonstrated proper donning/doffing of prosthesis.    Time  4    Period  Weeks    Status  Achieved      PT SHORT TERM GOAL #2   Title  Patient tolerates wear of prosthesis >8 hrs total /day without skin issues.     Baseline  11/27: Pt tolerates wear of prosthesis for 5hrs/2x day without current skin issues.     Time  4    Period  Weeks    Status  Achieved      PT SHORT TERM GOAL #3   Title  Patient ambulates 300' with RW outdoors including ramps & curbs with supervision.     Baseline  11/27: Pt ambulated 300' with RW outdoors, Supervision for safety.    Time  4    Period  Weeks    Status  Achieved      PT SHORT TERM GOAL #4   Title  Patient ambulates 150' with cane & prosthesis with minA.     Baseline  11/27: Pt ambulated 175' with cane/prosthesis, Min A/Guard.     Time  4    Period  Weeks    Status  Achieved      PT SHORT TERM GOAL #5   Title  Patient stands with prosthesis & no UE support reaching 5" & reaches towards floor to knee level with supervision.    Baseline  Pt able to stand with no UE support/reach forward 5" and down below knee level with supervision.     Time  4    Period  Weeks    Status  Achieved        PT Long Term Goals - 08/20/17 1558      PT LONG TERM GOAL #1   Title  Patient demonstrates & verbalizes proper prosthetic care to enable safe prosthesis use. (All LTGs Target Date 10/04/17)    Time  8    Period  Weeks    Status  On-going    Target Date  10/04/17  PT LONG TERM GOAL #2   Title  Patient tolerates prosthesis wear >90% of  awake hours without skin issues or limb pain to enable function throughout his day.     Time  8    Period  Weeks    Status  On-going    Target Date  10/04/17      PT LONG TERM GOAL #3   Title  Berg Balance >36/56 to indicate lower fall risk.     Time  8    Period  Weeks    Status  On-going    Target Date  10/04/17      PT LONG TERM GOAL #4   Title  Patient ambulates around furniture with prosthesis only with assist for vision (reports independent in his apt).     Time  8    Period  Weeks    Status  On-going    Target Date  10/04/17      PT LONG TERM GOAL #5   Title  Patient ambulates 500' with cane & prosthesis with supervision for vision without balance loss for community mobility.     Time  8    Period  Weeks    Status  On-going    Target Date  10/04/17      PT LONG TERM GOAL #6   Title  Patient negotiates stairs with 1 rail, ramps & curbs with cane & prosthesis with supervision for vision for community access.     Time  8    Period  Weeks    Status  On-going    Target Date  10/04/17      PT LONG TERM GOAL #7   Title  Patient demonstrates work simulation tasks including lifting and reaching with prosthesis safely.     Time  8    Period  Weeks    Status  On-going    Target Date  10/04/17      PT LONG TERM GOAL #8   Title  Patient reports limb pain </= 2/10 with standing activities.     Time  8    Period  Weeks    Status  On-going    Target Date  10/04/17            Plan - 09/24/17 1558    Clinical Impression Statement  Patient improved gait with cane forward motion but was challenged by sideways & backwards. He seems to understand recommendation for seated recumbent machines that use both UEs & LEs together like NuStep for cardio work.     Rehab Potential  Good    Clinical Impairments Affecting Rehab Potential  right TTA, DM1, neuropathy, acute renal failure, COPD, glaucoma, legally blind, HTN,     PT Frequency  2x / week    PT Duration  8 weeks    PT  Treatment/Interventions  ADLs/Self Care Home Management;DME Instruction;Gait training;Stair training;Functional mobility training;Therapeutic activities;Therapeutic exercise;Balance training;Neuromuscular re-education;Patient/family education;Prosthetic Training    PT Next Visit Plan  Prosthetic gait training with cane, balance activities, working towards [employer] and Agree with Plan of Care  Patient    Family Member Consulted  wife       Patient will benefit from skilled therapeutic intervention in order to improve the following deficits and impairments:  Abnormal gait, Decreased activity tolerance, Decreased balance, Decreased endurance, Decreased knowledge of use of DME, Decreased mobility, Decreased range of motion, Decreased strength, Impaired flexibility, Impaired vision/preception, Postural dysfunction, Prosthetic Dependency, Pain  Visit Diagnosis:  Other abnormalities of gait and mobility  Stiffness of right knee, not elsewhere classified  Muscle weakness (generalized)  Unsteadiness on feet     Problem List Patient Active Problem List   Diagnosis Date Noted  . Acquired absence of right leg below knee ([facility]) 03/13/2017  . Diabetic polyneuropathy associated with type 1 diabetes mellitus ([facility])   . Diabetes mellitus, new onset ([facility])   . Acute renal failure ([place])   . Acute kidney injury ([facility]) 02/27/2017  . Hyperglycemia 02/27/2017  . Leukocytosis 02/27/2017    Monee Dembeck  PT,DPT 09/24/2017, 4:10 PM  [facility] [address] [place], [place], [address] Phone: [phone]   Fax:  [phone]  Name: Kinnick Maus MRN: [id number] Date of Birth: 1951-08-28

## 2017-09-26 ENCOUNTER — Encounter: Payer: Self-pay | Admitting: Physical Therapy

## 2017-09-26 ENCOUNTER — Ambulatory Visit: Payer: BC Managed Care – PPO | Admitting: Physical Therapy

## 2017-09-26 DIAGNOSIS — R2681 Unsteadiness on feet: Secondary | ICD-10-CM | POA: Diagnosis not present

## 2017-09-26 DIAGNOSIS — M79604 Pain in right leg: Secondary | ICD-10-CM | POA: Diagnosis not present

## 2017-09-26 DIAGNOSIS — M6281 Muscle weakness (generalized): Secondary | ICD-10-CM

## 2017-09-26 DIAGNOSIS — R2689 Other abnormalities of gait and mobility: Secondary | ICD-10-CM | POA: Diagnosis not present

## 2017-09-26 DIAGNOSIS — R296 Repeated falls: Secondary | ICD-10-CM | POA: Diagnosis not present

## 2017-09-26 DIAGNOSIS — M25661 Stiffness of right knee, not elsewhere classified: Secondary | ICD-10-CM

## 2017-09-26 NOTE — Therapy (Signed)
[facility] [address] [place], [place], [address] Phone: [phone]   Fax:  [phone]  Physical Therapy Treatment  Patient Details  Name: Samuel Roth MRN: [id number] Date of Birth: 02-May-1951 Referring Provider: Meridee Score, MD   Encounter Date: 09/26/2017  PT End of Session - 09/26/17 2158    Visit Number  13    Number of Visits  17    Date for PT Re-Evaluation  10/04/17    Authorization Type  BCBS state primary & Medicare 2nd - G-code & progress note every 10th visit    Authorization Time Period  $1250 deductable met; 0 visit limit, no auth    PT Start Time  1100    PT Stop Time  1145    PT Time Calculation (min)  45 min    Equipment Utilized During Treatment  Gait belt    Activity Tolerance  Patient tolerated treatment well    Behavior During Therapy  WFL for tasks assessed/performed       Past Medical History:  Diagnosis Date  . Glaucoma   . Legally blind     Past Surgical History:  Procedure Laterality Date  . AMPUTATION Right 03/01/2017   Procedure: RIGHT TRANS METATARSAL AMPUTATION;  Surgeon: Newt Minion, MD;  Location: [facility];  Service: Orthopedics;  Laterality: Right;  . AMPUTATION Right 03/03/2017   Procedure: RIGHT BKA;  Surgeon: Newt Minion, MD;  Location: [facility];  Service: Orthopedics;  Laterality: Right;  . EYE SURGERY    . TIBIA FRACTURE SURGERY      There were no vitals filed for this visit.  Subjective Assessment - 09/26/17 1057    Subjective  He has not tried rollator with trash yet but has mentally inherited.     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  No/denies                       [facility] Adult PT Treatment/Exercise - 09/26/17 1100      Ambulation/Gait   Ambulation/Gait  Yes    Ambulation/Gait Assistance  5: Supervision;4: Min guard Min guard cane & supervision rollator    Ambulation Distance (Feet)  400 Feet    Assistive device  Straight cane;Prosthesis;Rollator    Ambulation Surface  Indoor;Level    Ramp  5: Supervision;4: Min assist SBA rollator walker & prosthesis' MinA cane    Ramp Details (indicate cue type and reason)  verbal cues on brake management & safety    Curb  5: Supervision;4: Min assist SBA rollator & prosthesis; MinA cane    Curb Details (indicate cue type and reason)  verbal cues on rollator management and sequence      High Level Balance   High Level Balance Activities  Side stepping;Backward walking;Head turns;Negotiating over obstacles cane & prosthesis    High Level Balance Comments  Min guard & tactile/verbal cues on balance reaction & technique for sidestepping & backwards.  Manual & verbal cues to maintain path with head turns side to side & up/down.   PT instructed in technique to step over obstacles.       Self-Care   Self-Care  Lifting  Lifting  Picking up object from floor with verbal cues on technique with prosthesis      Knee/Hip Exercises: Standing   Forward Step Up  Left;3 sets;5 reps;Step Height: 4" cane & light counter support    Step Down  Right;3 sets;5 reps;Step Height: 4" cane & light counter support      Prosthetics   Prosthetic Care Comments   --    Current prosthetic wear tolerance (days/week)   daily    Current prosthetic wear tolerance (#hours/day)   7 hrs 2x/day    Residual limb condition   --    Education Provided  Skin check;Residual limb care;Proper wear schedule/adjustment    Person(s) Educated  Patient    Education Method  Explanation;Verbal cues    Education Method  Verbalized understanding;Verbal cues required;Needs further instruction               PT Short  Term Goals - 09/05/17 1440      PT SHORT TERM GOAL #1   Title  Patient verbalizes proper cleaning & demonstrates proper donning of prosthesis (All STGs Target Date 09/06/17)    Baseline  11/27: Pt verbalized proper cleaning of prosthesis liner.   11/29: Pt demonstrated proper donning/doffing of prosthesis.    Time  4    Period  Weeks    Status  Achieved      PT SHORT TERM GOAL #2   Title  Patient tolerates wear of prosthesis >8 hrs total /day without skin issues.     Baseline  11/27: Pt tolerates wear of prosthesis for 5hrs/2x day without current skin issues.     Time  4    Period  Weeks    Status  Achieved      PT SHORT TERM GOAL #3   Title  Patient ambulates 300' with RW outdoors including ramps & curbs with supervision.     Baseline  11/27: Pt ambulated 300' with RW outdoors, Supervision for safety.    Time  4    Period  Weeks    Status  Achieved      PT SHORT TERM GOAL #4   Title  Patient ambulates 150' with cane & prosthesis with minA.     Baseline  11/27: Pt ambulated 175' with cane/prosthesis, Min A/Guard.     Time  4    Period  Weeks    Status  Achieved      PT SHORT TERM GOAL #5   Title  Patient stands with prosthesis & no UE support reaching 5" & reaches towards floor to knee level with supervision.    Baseline  Pt able to stand with no UE support/reach forward 5" and down below knee level with supervision.     Time  4    Period  Weeks    Status  Achieved        PT Long Term Goals - 08/20/17 1558      PT LONG TERM GOAL #1   Title  Patient demonstrates & verbalizes proper prosthetic care to enable safe prosthesis use. (All LTGs Target Date 10/04/17)    Time  8    Period  Weeks    Status  On-going    Target Date  10/04/17      PT LONG TERM GOAL #2   Title  Patient tolerates prosthesis wear >90% of awake hours without skin issues or limb pain to enable function throughout his day.     Time  8    Period  Weeks    Status  On-going    Target Date  10/04/17       PT LONG TERM GOAL #3   Title  Berg Balance >36/56 to indicate lower fall risk.     Time  8    Period  Weeks    Status  On-going    Target Date  10/04/17      PT LONG TERM GOAL #4   Title  Patient ambulates around furniture with prosthesis only with assist for vision (reports independent in his apt).     Time  8    Period  Weeks    Status  On-going    Target Date  10/04/17      PT LONG TERM GOAL #5   Title  Patient ambulates 500' with cane & prosthesis with supervision for vision without balance loss for community mobility.     Time  8    Period  Weeks    Status  On-going    Target Date  10/04/17      PT LONG TERM GOAL #6   Title  Patient negotiates stairs with 1 rail, ramps & curbs with cane & prosthesis with supervision for vision for community access.     Time  8    Period  Weeks    Status  On-going    Target Date  10/04/17      PT LONG TERM GOAL #7   Title  Patient demonstrates work simulation tasks including lifting and reaching with prosthesis safely.     Time  8    Period  Weeks    Status  On-going    Target Date  10/04/17      PT LONG TERM GOAL #8   Title  Patient reports limb pain </= 2/10 with standing activities.     Time  8    Period  Weeks    Status  On-going    Target Date  10/04/17            Plan - 09/26/17 2159    Rehab Potential  Good    Clinical Impairments Affecting Rehab Potential  right TTA, DM1, neuropathy, acute renal failure, COPD, glaucoma, legally blind, HTN,     PT Frequency  2x / week    PT Duration  8 weeks    PT Treatment/Interventions  ADLs/Self Care Home Management;DME Instruction;Gait training;Stair training;Functional mobility training;Therapeutic activities;Therapeutic exercise;Balance training;Neuromuscular re-education;Patient/family education;Prosthetic Training    PT Next Visit Plan  Prosthetic gait training with cane, balance activities, working towards [employer] and Agree with Plan of Care  Patient     Family Member Consulted  wife       Patient will benefit from skilled therapeutic intervention in order to improve the following deficits and impairments:  Abnormal gait, Decreased activity tolerance, Decreased balance, Decreased endurance, Decreased knowledge of use of DME, Decreased mobility, Decreased range of motion, Decreased strength, Impaired flexibility, Impaired vision/preception, Postural dysfunction, Prosthetic Dependency, Pain  Visit Diagnosis: Other abnormalities of gait and mobility  Stiffness of right knee, not elsewhere classified  Muscle weakness (generalized)  Unsteadiness on feet  Repeated falls     Problem List Patient Active Problem List   Diagnosis Date Noted  . Acquired absence of right leg below knee ([place]) 03/13/2017  . Diabetic polyneuropathy associated with type 1 diabetes mellitus ([place])   . Diabetes mellitus, new onset ([facility])   . Acute renal failure ([place])   . Acute kidney injury ([facility] [place])  02/27/2017  . Hyperglycemia 02/27/2017  . Leukocytosis 02/27/2017    Tom Ragsdale PT, DPT 09/26/2017, 10:01 PM  [facility] [address] [place], [place], [address] Phone: [phone]   Fax:  [phone]  Name: Tymeer Vaquera MRN: [id number] Date of Birth: 1950/12/22

## 2017-09-30 ENCOUNTER — Ambulatory Visit: Payer: BC Managed Care – PPO | Admitting: Physical Therapy

## 2017-09-30 ENCOUNTER — Encounter: Payer: Self-pay | Admitting: Physical Therapy

## 2017-09-30 DIAGNOSIS — R2681 Unsteadiness on feet: Secondary | ICD-10-CM

## 2017-09-30 DIAGNOSIS — M6281 Muscle weakness (generalized): Secondary | ICD-10-CM | POA: Diagnosis not present

## 2017-09-30 DIAGNOSIS — R2689 Other abnormalities of gait and mobility: Secondary | ICD-10-CM | POA: Diagnosis not present

## 2017-09-30 DIAGNOSIS — M25661 Stiffness of right knee, not elsewhere classified: Secondary | ICD-10-CM

## 2017-09-30 DIAGNOSIS — R296 Repeated falls: Secondary | ICD-10-CM | POA: Diagnosis not present

## 2017-09-30 DIAGNOSIS — M79604 Pain in right leg: Secondary | ICD-10-CM | POA: Diagnosis not present

## 2017-10-01 NOTE — Therapy (Signed)
[facility] [address] [place], [place], [address] Phone: [phone]   Fax:  [phone]  Physical Therapy Treatment  Patient Details  Name: Samuel Roth MRN: [id number] Date of Birth: 11/04/1950 Referring Provider: Meridee Score, MD   Encounter Date: 09/30/2017  PT End of Session - 09/30/17 1258    Visit Number  14    Number of Visits  17    Date for PT Re-Evaluation  10/04/17    Authorization Type  BCBS state primary & Medicare 2nd - G-code & progress note every 10th visit    Authorization Time Period  $1250 deductable met; 0 visit limit, no auth    PT Start Time  1100    PT Stop Time  1145    PT Time Calculation (min)  45 min    Equipment Utilized During Treatment  Gait belt    Activity Tolerance  Patient tolerated treatment well    Behavior During Therapy  WFL for tasks assessed/performed       Past Medical History:  Diagnosis Date  . Glaucoma   . Legally blind     Past Surgical History:  Procedure Laterality Date  . AMPUTATION Right 03/01/2017   Procedure: RIGHT TRANS METATARSAL AMPUTATION;  Surgeon: Newt Minion, MD;  Location: [facility];  Service: Orthopedics;  Laterality: Right;  . AMPUTATION Right 03/03/2017   Procedure: RIGHT BKA;  Surgeon: Newt Minion, MD;  Location: [facility];  Service: Orthopedics;  Laterality: Right;  . EYE SURGERY    . TIBIA FRACTURE SURGERY      There were no vitals filed for this visit.  Subjective Assessment - 09/30/17 1106    Subjective  He is wearing prosthesis all awake hours except early morning hours until he gets his coffee.     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  No/denies          [facility] PT Assessment - 09/30/17 1100      Standardized Balance Assessment   Standardized Balance Assessment  Berg Balance Test;Timed Up and Go Test;Dynamic Gait Index      Berg Balance Test   Sit to Stand  Able to stand  independently using hands    Standing Unsupported  Able to stand 30 seconds unsupported    Sitting with Back Unsupported but Feet Supported on Floor or Stool  Able to sit safely and securely 2 minutes    Stand to Sit  Controls descent by using hands    Transfers  Able to transfer safely, minor use of hands    Standing Unsupported with Eyes Closed  Able to stand 10 seconds with supervision    Standing Ubsupported with Feet Together  Able to place feet together independently and stand for 1 minute with supervision    From Standing, Reach Forward with Outstretched Arm  Can reach forward >5 cm safely (2")    From Standing Position, Pick up Object from Floor  Able to pick up shoe, needs supervision    From Standing Position, Turn to Look Behind Over each Shoulder  Turn sideways only but maintains balance    Turn 360 Degrees  Needs close supervision or verbal cueing  Standing Unsupported, Alternately Place Feet on Step/Stool  Able to complete >2 steps/needs minimal assist    Standing Unsupported, One Foot in Front  Able to take small step independently and hold 30 seconds    Standing on One Leg  Tries to lift leg/unable to hold 3 seconds but remains standing independently    Total Score  34    Berg comment:  Initial Berg 14/56      Dynamic Gait Index   Level Surface  Moderate Impairment    Change in Gait Speed  Severe Impairment    Gait with Horizontal Head Turns  Moderate Impairment    Gait with Vertical Head Turns  Moderate Impairment    Gait and Pivot Turn  Severe Impairment    Step Over Obstacle  Severe Impairment    Step Around Obstacles  Moderate Impairment    Steps  Moderate Impairment    Total Score  5    DGI comment:  cane use      Timed Up and Go Test    Normal TUG (seconds)  18.23 18.23sec cane; 20.05sec no device                  [facility] Adult PT Treatment/Exercise - 09/30/17 1100      Ambulation/Gait   Ambulation/Gait  Yes    Ambulation/Gait Assistance  5: Supervision;4: Min assist;4: Min guard MinA no device, Min guard cane & supervision rollator    Ambulation/Gait Assistance Details  Tactile & verbal cues on posture & balance reactions. Rollator for trash management, cane for community based gait & no device for household simulated gait.      Ambulation Distance (Feet)  500 Feet 200' outdoors including 30' on grass, 100' no device    Assistive device  Straight cane;Prosthesis;Rollator;None rollator to enter/exit & simulate trash mgmt, cane community    Ambulation Surface  Indoor;Level;Outdoor;Paved;Grass    Stairs  Yes    Stairs Assistance  5: Supervision    Stairs Assistance Details (indicate cue type and reason)  verbal cues on technique with cane & single rail    Stair Management Technique  One rail Right;One rail Left;With cane;Step to pattern;Forwards    Number of Stairs  4 3 reps    Ramp  5: Supervision;4: Min assist Min guard cane & prosthesis    Ramp Details (indicate cue type and reason)  verbal cues on posture & wt shift    Curb  5: Supervision;4: Min assist Min guard cane & prosthesis    Curb Details (indicate cue type and reason)  verbal cues on sequence & using cane to sense curb location & height.       High Level Balance   High Level Balance Activities  -- cane & prosthesis      Self-Care   Self-Care  Lifting    Lifting  Picking up object from floor with verbal cues on technique with prosthesis      Prosthetics   Current prosthetic wear tolerance (days/week)   daily    Current prosthetic wear tolerance (#hours/day)   7 hrs 2x/day               PT Short Term Goals - 09/05/17 1440      PT SHORT TERM GOAL #1   Title  Patient verbalizes proper cleaning & demonstrates proper donning of  prosthesis (All STGs Target Date 09/06/17)    Baseline  11/27: Pt verbalized proper cleaning of prosthesis liner.   11/29: Pt demonstrated proper  donning/doffing of prosthesis.    Time  4    Period  Weeks    Status  Achieved      PT SHORT TERM GOAL #2   Title  Patient tolerates wear of prosthesis >8 hrs total /day without skin issues.     Baseline  11/27: Pt tolerates wear of prosthesis for 5hrs/2x day without current skin issues.     Time  4    Period  Weeks    Status  Achieved      PT SHORT TERM GOAL #3   Title  Patient ambulates 300' with RW outdoors including ramps & curbs with supervision.     Baseline  11/27: Pt ambulated 300' with RW outdoors, Supervision for safety.    Time  4    Period  Weeks    Status  Achieved      PT SHORT TERM GOAL #4   Title  Patient ambulates 150' with cane & prosthesis with minA.     Baseline  11/27: Pt ambulated 175' with cane/prosthesis, Min A/Guard.     Time  4    Period  Weeks    Status  Achieved      PT SHORT TERM GOAL #5   Title  Patient stands with prosthesis & no UE support reaching 5" & reaches towards floor to knee level with supervision.    Baseline  Pt able to stand with no UE support/reach forward 5" and down below knee level with supervision.     Time  4    Period  Weeks    Status  Achieved        PT Long Term Goals - 09/30/17 1300      PT LONG TERM GOAL #1   Title  Patient demonstrates & verbalizes proper prosthetic care to enable safe prosthesis use. (All LTGs Target Date 10/04/17)    Time  8    Period  Weeks    Status  On-going      PT LONG TERM GOAL #2   Title  Patient tolerates prosthesis wear >90% of awake hours without skin issues or limb pain to enable function throughout his day.     Time  8    Period  Weeks    Status  On-going      PT LONG TERM GOAL #3   Title  Berg Balance >36/56 to indicate lower fall risk.     Baseline  Progressed to 34/56    Time  8    Period  Weeks    Status  Not Met      PT LONG  TERM GOAL #4   Title  Patient ambulates around furniture with prosthesis only with assist for vision (reports independent in his apt).     Baseline  NOT MET patient requires minA for gait with prosthesis only.     Time  8    Period  Weeks    Status  Not Met      PT LONG TERM GOAL #5   Title  Patient ambulates 500' with cane & prosthesis with supervision for vision without balance loss for community mobility.     Baseline  NOT MET 09/30/2017 Patient ambulates 500' with cane & prosthesis with min guard.     Time  8    Period  Weeks    Status  Not Met      PT LONG TERM GOAL #6   Title  Patient negotiates stairs with 1 rail, ramps &  curbs with cane & prosthesis with supervision for vision for community access.     Baseline  NOT MET 09/30/2017 MinA for ramp & curb negotiation with cane    Time  8    Period  Weeks    Status  Not Met      PT LONG TERM GOAL #7   Title  Patient demonstrates work simulation tasks including lifting and reaching with prosthesis safely.     Time  8    Period  Weeks    Status  On-going      PT LONG TERM GOAL #8   Title  Patient reports limb pain </= 2/10 with standing activities.     Time  8    Period  Weeks    Status  On-going            Plan - 09/30/17 1303    Clinical Impression Statement  Patient has improved mobility with prosthetic gait & balance but has not met LTGs. He would benefit from additional skilled care to improve safety & mobility with prosthesis similar to his prior level of function.     Rehab Potential  Good    Clinical Impairments Affecting Rehab Potential  right TTA, DM1, neuropathy, acute renal failure, COPD, glaucoma, legally blind, HTN,     PT Frequency  2x / week    PT Duration  8 weeks    PT Treatment/Interventions  ADLs/Self Care Home Management;DME Instruction;Gait training;Stair training;Functional mobility training;Therapeutic activities;Therapeutic exercise;Balance training;Neuromuscular re-education;Patient/family  education;Prosthetic Training    PT Next Visit Plan  assess remaining LTGs & recertify for 8 weeks 2x/wk, check gait velocity with cane & without device    Consulted and Agree with Plan of Care  Patient    Family Member Consulted  --       Patient will benefit from skilled therapeutic intervention in order to improve the following deficits and impairments:  Abnormal gait, Decreased activity tolerance, Decreased balance, Decreased endurance, Decreased knowledge of use of DME, Decreased mobility, Decreased range of motion, Decreased strength, Impaired flexibility, Impaired vision/preception, Postural dysfunction, Prosthetic Dependency, Pain  Visit Diagnosis: Other abnormalities of gait and mobility  Stiffness of right knee, not elsewhere classified  Muscle weakness (generalized)  Unsteadiness on feet     Problem List Patient Active Problem List   Diagnosis Date Noted  . Acquired absence of right leg below knee ([place]) 03/13/2017  . Diabetic polyneuropathy associated with type 1 diabetes mellitus ([place])   . Diabetes mellitus, new onset ([place])   . Acute renal failure ([place])   . Acute kidney injury ([facility] [facility]) 02/27/2017  . Hyperglycemia 02/27/2017  . Leukocytosis 02/27/2017    Meliah Appleman PT, DPT 10/01/2017, 1:11 AM  [facility] [address] [place], [place], [address] Phone: [phone]   Fax:  [phone]  Name: Samuel Roth MRN: [id number] Date of Birth: 31-Mar-1951

## 2017-10-03 ENCOUNTER — Encounter: Payer: Self-pay | Admitting: Physical Therapy

## 2017-10-03 ENCOUNTER — Ambulatory Visit: Payer: BC Managed Care – PPO | Admitting: Physical Therapy

## 2017-10-03 DIAGNOSIS — R2681 Unsteadiness on feet: Secondary | ICD-10-CM

## 2017-10-03 DIAGNOSIS — R2689 Other abnormalities of gait and mobility: Secondary | ICD-10-CM | POA: Diagnosis not present

## 2017-10-03 DIAGNOSIS — R296 Repeated falls: Secondary | ICD-10-CM

## 2017-10-03 DIAGNOSIS — M79604 Pain in right leg: Secondary | ICD-10-CM

## 2017-10-03 DIAGNOSIS — M25661 Stiffness of right knee, not elsewhere classified: Secondary | ICD-10-CM | POA: Diagnosis not present

## 2017-10-03 DIAGNOSIS — M6281 Muscle weakness (generalized): Secondary | ICD-10-CM | POA: Diagnosis not present

## 2017-10-04 NOTE — Therapy (Signed)
[facility] [address] [place], [place], [address] Phone: [phone]   Fax:  [phone]  Physical Therapy Treatment  Patient Details  Name: Samuel Roth MRN: [id number] Date of Birth: 20-Sep-1951 Referring Provider: Meridee Score, MD   Encounter Date: 10/03/2017  PT End of Session - 10/03/17 1157    Visit Number  15    Number of Visits  31    Date for PT Re-Evaluation  11/29/17    Authorization Type  BCBS state primary & Medicare 2nd - G-code & progress note every 10th visit    Authorization Time Period  $1250 deductable met; 0 visit limit, no auth    PT Start Time  1100    PT Stop Time  1145    PT Time Calculation (min)  45 min    Equipment Utilized During Treatment  Gait belt    Activity Tolerance  Patient tolerated treatment well    Behavior During Therapy  WFL for tasks assessed/performed       Past Medical History:  Diagnosis Date  . Glaucoma   . Legally blind     Past Surgical History:  Procedure Laterality Date  . AMPUTATION Right 03/01/2017   Procedure: RIGHT TRANS METATARSAL AMPUTATION;  Surgeon: Newt Minion, MD;  Location: [facility];  Service: Orthopedics;  Laterality: Right;  . AMPUTATION Right 03/03/2017   Procedure: RIGHT BKA;  Surgeon: Newt Minion, MD;  Location: [facility];  Service: Orthopedics;  Laterality: Right;  . EYE SURGERY    . TIBIA FRACTURE SURGERY      There were no vitals filed for this visit.  Subjective Assessment - 10/03/17 1100    Subjective  He thinks he may have a blister so he wants PT to check his limb. He brought his sight / blind cane as PT requested. He feels PT has helped his mobility but needs more to maximize mobility & safety.     Patient is accompained by:  Family member    Pertinent History  right TTA, DM1, neuropathy, acute renal failure, COPD, glaucoma, legally blind since birth, HTN,     Limitations  Lifting;Standing;Walking;House hold activities    Patient Stated  Goals  walk with prosthesis, work loading vending machines    Currently in Pain?  No/denies                      [facility] Adult PT Treatment/Exercise - 10/03/17 1100      Ambulation/Gait   Ambulation/Gait  Yes    Ambulation/Gait Assistance  5: Supervision;4: Min assist;4: Min guard    Ambulation/Gait Assistance Details  Verbal & tactile cues on use of sight cane in RUE for sensing environment & single point cane LUE for support & balance. PT added crutch tip to sight cane as it slipped at times esp ramp & curb. Addition of crutch tip did improve stability.    Ambulation Distance (Feet)  500 Feet    Assistive device  Straight cane;Prosthesis sight/blind cane in RUE    Ambulation Surface  Indoor;Level    Stairs  Yes    Stairs Assistance  5: Supervision    Stair Management Technique  One rail Right;One rail Left;With cane;Step to pattern;Forwards    Number of Stairs  4 3 reps    Ramp  5: Supervision;4: Min assist Min guard cane & prosthesis /sight cane RUE    Ramp Details (indicate cue type and reason)  verbal cues on technique with single  point cane & sight cane. Initially sight cane slipped causing balance loss but PT added crutch tip to sight cane which corrected slippage.     Curb  4: Min assist Min guard cane & prosthesis & sight cane    Curb Details (indicate cue type and reason)  verbal cues on technique with single point cane & sight cane. Initially sight cane slipped causing balance loss but PT added crutch tip to sight cane which corrected slippage.       High Level Balance   High Level Balance Activities  -- cane & prosthesis      Self-Care   Self-Care  Lifting    Lifting  Picking up object from floor with verbal cues on technique with prosthesis      Prosthetics   Prosthetic Care Comments   PT instructed in increasing ply of cutoff socks vs full length to decrease pressure at femoral condyles where he has another blister. Use of Tegaderm until new blister heals.   Set-up appointment with prosthetist    Current prosthetic wear tolerance (days/week)   daily    Current prosthetic wear tolerance (#hours/day)   7 hrs 2x/day    Residual limb condition   new blester on medial femoral condyle in diiferent spot than previous blister. Blister is 1cm with fluid still present. No signs of infection.     Education Provided  Skin check;Residual limb care;Correct ply sock adjustment;Proper Donning    Person(s) Educated  Patient    Education Method  Explanation;Demonstration;Tactile cues;Verbal cues    Education Method  Verbalized understanding;Returned demonstration;Tactile cues required;Verbal cues required;Needs further instruction               PT Short Term Goals - 10/03/17 1205      PT SHORT TERM GOAL #1   Title  Patient verbalizes proper care of residual limb including adjusting ply socks & sweat management.  (All STGs Target Date 11/01/17)    Time  4    Period  Weeks    Status  New    Target Date  11/01/17      PT SHORT TERM GOAL #2   Title  Patient tolerates wear of prosthesis >15 hrs total /day without skin issues.     Time  4    Period  Weeks    Status  New    Target Date  11/01/17      PT SHORT TERM GOAL #3   Title  Patient ambulates 300' with straight cane, Blind cane & prosthesis outdoors including ramps & curbs with supervision.     Time  4    Period  Weeks    Status  New    Target Date  11/01/17      PT SHORT TERM GOAL #4   Title  Patient ambulates 57' with prosthesis only with min guard.     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    Time  4    Period  Weeks    Status  New    Target Date  11/01/17        PT Long Term Goals - 10/03/17 1159      PT LONG TERM GOAL #1   Title  Patient demonstrates & verbalizes proper prosthetic care to  enable safe prosthesis use. (All LTGs Target Date 11/29/17)     Baseline  Progressing 10/03/2017 Patient requires cues for skin management as he presented with 2nd blister on medial proximal knee.     Time  8    Period  Weeks    Status  On-going    Target Date  11/29/17      PT LONG TERM GOAL #2   Title  Patient tolerates prosthesis wear >90% of awake hours without skin issues or limb pain to enable function throughout his day.     Baseline  Progressing 10/03/2017 Patient is wearing prosthesis 7hrs 2x/day which is ~75% of awake hours. He developed 2nd blister week of 09/30/17.     Time  8    Period  Weeks    Status  On-going    Target Date  11/29/17      PT LONG TERM GOAL #3   Title  Berg Balance >/= 40/56 to indicate lower fall risk.     Baseline  Progressed to 34/56 with initial LTG 36/56 on 09/30/2017    Time  8    Period  Weeks    Status  Revised    Target Date  11/29/17      PT LONG TERM GOAL #4   Title  Patient ambulates around furniture with prosthesis only with assist for vision (reports independent in his apt).     Baseline  NOT MET 09/30/2017  patient requires minA for gait with prosthesis only.     Time  8    Period  Weeks    Status  On-going    Target Date  11/29/17      PT LONG TERM GOAL #5   Title  Patient ambulates 500' with straight cane, Blind cane & prosthesis with supervision for vision without balance loss for community mobility.     Baseline  NOT MET 09/30/2017 Patient ambulates 500' with cane & prosthesis with min guard.     Time  8    Period  Weeks    Status  On-going    Target Date  11/29/17      PT LONG TERM GOAL #6   Title  Patient negotiates stairs with 1 rail, ramps & curbs with cane, Blind cane & prosthesis with supervision for vision for community access.     Baseline  NOT MET 09/30/2017 MinA for ramp & curb negotiation with cane    Time  8    Period  Weeks    Status  On-going    Target Date  11/29/17      PT LONG TERM GOAL #7   Title  Patient demonstrates work simulation tasks including lifting and  reaching with prosthesis safely.     Time  8    Period  Weeks    Status  On-going    Target Date  11/29/17      PT LONG TERM GOAL #8   Title  Patient reports limb pain </= 2/10 with standing activities.     Baseline  MET 10/03/2017    Time  8    Period  Weeks    Status  Achieved            Plan - 10/03/17 1210    Clinical Impression Statement  Patient has made good progress with mobility with his prosthesis with skilled Physical Therapy but needs additional care to maximize function & safety. He developed a  2nd blister on medial proximal knee that will need  skilled monitoring. His vision is factor in slow progress.     Rehab Potential  Good    Clinical Impairments Affecting Rehab Potential  right TTA, DM1, neuropathy, acute renal failure, COPD, glaucoma, legally blind, HTN,     PT Frequency  2x / week    PT Duration  8 weeks    PT Treatment/Interventions  ADLs/Self Care Home Management;DME Instruction;Gait training;Stair training;Functional mobility training;Therapeutic activities;Therapeutic exercise;Balance training;Neuromuscular re-education;Patient/family education;Prosthetic Training    PT Next Visit Plan  Prosthetic gait with single point cane & blind cane for community activities and prosthesis only for household activities. Work towards updated STGs. Monitor wound.    Consulted and Agree with Plan of Care  Patient       Patient will benefit from skilled therapeutic intervention in order to improve the following deficits and impairments:  Abnormal gait, Decreased activity tolerance, Decreased balance, Decreased endurance, Decreased knowledge of use of DME, Decreased mobility, Decreased range of motion, Decreased strength, Impaired flexibility, Impaired vision/preception, Postural dysfunction, Prosthetic Dependency, Pain  Visit Diagnosis: Other abnormalities of gait and mobility  Stiffness of right knee, not elsewhere classified  Muscle weakness  (generalized)  Unsteadiness on feet  Repeated falls  Pain in right leg     Problem List Patient Active Problem List   Diagnosis Date Noted  . Acquired absence of right leg below knee ([place]) 03/13/2017  . Diabetic polyneuropathy associated with type 1 diabetes mellitus ([place])   . Diabetes mellitus, new onset ([place])   . Acute renal failure ([place])   . Acute kidney injury ([place]) 02/27/2017  . Hyperglycemia 02/27/2017  . Leukocytosis 02/27/2017    Talley Kreiser PT, DPT 10/04/2017, 12:17 PM  [facility] [address] [place], [place], [address] Phone: [phone]   Fax:  [phone]  Name: Samuel Roth MRN: [id number] Date of Birth: [birth year]

## 2017-10-09 ENCOUNTER — Encounter: Payer: Medicare Other | Admitting: Physical Therapy

## 2017-10-15 ENCOUNTER — Encounter: Payer: Self-pay | Admitting: Physical Therapy

## 2017-10-15 ENCOUNTER — Ambulatory Visit: Payer: BC Managed Care – PPO | Attending: Orthopedic Surgery | Admitting: Physical Therapy

## 2017-10-15 DIAGNOSIS — R2689 Other abnormalities of gait and mobility: Secondary | ICD-10-CM | POA: Insufficient documentation

## 2017-10-15 DIAGNOSIS — R296 Repeated falls: Secondary | ICD-10-CM | POA: Insufficient documentation

## 2017-10-15 DIAGNOSIS — M79604 Pain in right leg: Secondary | ICD-10-CM | POA: Diagnosis not present

## 2017-10-15 DIAGNOSIS — M6281 Muscle weakness (generalized): Secondary | ICD-10-CM | POA: Diagnosis not present

## 2017-10-15 DIAGNOSIS — R2681 Unsteadiness on feet: Secondary | ICD-10-CM

## 2017-10-15 DIAGNOSIS — M25661 Stiffness of right knee, not elsewhere classified: Secondary | ICD-10-CM | POA: Insufficient documentation

## 2017-10-16 NOTE — Therapy (Signed)
[facility] [address] [place], [place], [address] Phone: [phone]   Fax:  [phone]  Physical Therapy Treatment  Patient Details  Name: Samuel Roth MRN: [id number] Date of Birth: 04-06-1951 Referring Provider: Meridee Score, MD   Encounter Date: 10/15/2017  PT End of Session - 10/15/17 1111    Visit Number  16    Number of Visits  31    Date for PT Re-Evaluation  11/29/17    Authorization Type  BCBS state primary & Medicare 2nd    Authorization Time Period  $1250 deductable met; 0 visit limit, no auth    PT Start Time  1106    PT Stop Time  1145    PT Time Calculation (min)  39 min    Equipment Utilized During Treatment  Gait belt    Activity Tolerance  Patient tolerated treatment well    Behavior During Therapy  WFL for tasks assessed/performed       Past Medical History:  Diagnosis Date  . Glaucoma   . Legally blind     Past Surgical History:  Procedure Laterality Date  . AMPUTATION Right 03/01/2017   Procedure: RIGHT TRANS METATARSAL AMPUTATION;  Surgeon: Newt Minion, MD;  Location: [facility];  Service: Orthopedics;  Laterality: Right;  . AMPUTATION Right 03/03/2017   Procedure: RIGHT BKA;  Surgeon: Newt Minion, MD;  Location: [facility];  Service: Orthopedics;  Laterality: Right;  . EYE SURGERY    . TIBIA FRACTURE SURGERY      There were no vitals filed for this visit.  Subjective Assessment - 10/15/17 1109    Subjective  No new complaints. Wants [place] to look at his limb. No pain to report. Did have a fall back into his wheelchair "last week sometime". Stood up and lost his balance, landed back into chair. No dizziness, unsure what happened.     Patient is accompained by:  Family member    Pertinent History  right TTA, DM1, neuropathy, acute renal failure, COPD, glaucoma, legally blind since birth, HTN,     Limitations  Lifting;Standing;Walking;House hold activities    Patient Stated Goals  walk with  prosthesis, work loading vending machines    Currently in Pain?  No/denies    Pain Score  0-No pain                      [facility] Adult PT Treatment/Exercise - 10/15/17 1115      Transfers   Transfers  Sit to Stand;Stand to Sit    Sit to Stand  5: Supervision;With upper extremity assist;With armrests;From chair/3-in-1    Sit to Stand Details  Verbal cues for precautions/safety;Verbal cues for safe use of DME/AE    Stand to Sit  5: Supervision;With upper extremity assist;To chair/3-in-1;With armrests    Stand to Sit Details (indicate cue type and reason)  Verbal cues for precautions/safety;Verbal cues for safe use of DME/AE      Ambulation/Gait   Ambulation/Gait  Yes    Ambulation/Gait Assistance  5: Supervision;4: Min assist;4: Min guard    Ambulation/Gait Assistance Details  one loss of balance due to toe scuffing needing min assist to prevent forward fall    Ambulation Distance (Feet)  220 Feet x1, 500 x1    Assistive device  Straight cane;Prosthesis    Gait Pattern  Decreased step length - left;Right hip hike;Step-through pattern;Decreased arm swing - right;Decreased stance time - right;Decreased stride length;Left flexed knee in stance;Antalgic  Ambulation Surface  Level;Indoor    Stairs  Yes    Stairs Assistance  5: Supervision;4: Min guard    Stairs Assistance Details (indicate cue type and reason)  cues for sequencing, hand advancement on rails, and cane placement so not to step on it    Stair Management Technique  One rail Right;One rail Left;With cane;Step to pattern;Forwards    Number of Stairs  4 x2 reps    Height of Stairs  6    Ramp  Other (comment) min guard with cane/sight cane    Curb  Other (comment) min guard assist with cane/sighted cane      Prosthetics   Prosthetic Care Comments   pt with thigh barrier sock too low on limb. educated on keeping in above knee crease to prevent skin breakdown.     Current prosthetic wear tolerance (days/week)   daily     Current prosthetic wear tolerance (#hours/day)   10-12 hours total a day, taking breaks midday most days    Residual limb condition   blister is now open, no longer fluid filled. covered with tegaderm. noted area above it starting to form a blister.     Education Provided  Skin check;Residual limb care;Correct ply sock adjustment;Proper Donning    Person(s) Educated  Patient    Education Method  Explanation;Demonstration;Verbal cues    Education Method  Verbalized understanding;Verbal cues required;Needs further instruction    Donning Prosthesis  Supervision    Doffing Prosthesis  Modified independent (device/increased time)          PT Short Term Goals - 10/03/17 1205      PT SHORT TERM GOAL #1   Title  Patient verbalizes proper care of residual limb including adjusting ply socks & sweat management.  (All STGs Target Date 11/01/17)    Time  4    Period  Weeks    Status  New    Target Date  11/01/17      PT SHORT TERM GOAL #2   Title  Patient tolerates wear of prosthesis >15 hrs total /day without skin issues.     Time  4    Period  Weeks    Status  New    Target Date  11/01/17      PT SHORT TERM GOAL #3   Title  Patient ambulates 300' with straight cane, Blind cane & prosthesis outdoors including ramps & curbs with supervision.     Time  4    Period  Weeks    Status  New    Target Date  11/01/17      PT SHORT TERM GOAL #4   Title  Patient ambulates 64' with prosthesis only with min guard.     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    Time  4    Period  Weeks    Status  New    Target Date  11/01/17        PT Long Term Goals - 10/03/17 1159      PT LONG TERM GOAL #1   Title  Patient demonstrates & verbalizes proper prosthetic care to enable safe prosthesis use. (All LTGs Target Date 11/29/17)    Baseline  Progressing 10/03/2017  Patient requires cues for skin management  as he presented with 2nd blister on medial proximal knee.     Time  8    Period  Weeks    Status  On-going    Target Date  11/29/17      PT LONG TERM GOAL #2   Title  Patient tolerates prosthesis wear >90% of awake hours without skin issues or limb pain to enable function throughout his day.     Baseline  Progressing 10/03/2017 Patient is wearing prosthesis 7hrs 2x/day which is ~75% of awake hours. He developed 2nd blister week of 09/30/17.     Time  8    Period  Weeks    Status  On-going    Target Date  11/29/17      PT LONG TERM GOAL #3   Title  Berg Balance >/= 40/56 to indicate lower fall risk.     Baseline  Progressed to 34/56 with initial LTG 36/56 on 09/30/2017    Time  8    Period  Weeks    Status  Revised    Target Date  11/29/17      PT LONG TERM GOAL #4   Title  Patient ambulates around furniture with prosthesis only with assist for vision (reports independent in his apt).     Baseline  NOT MET 09/30/2017  patient requires minA for gait with prosthesis only.     Time  8    Period  Weeks    Status  On-going    Target Date  11/29/17      PT LONG TERM GOAL #5   Title  Patient ambulates 500' with straight cane, Blind cane & prosthesis with supervision for vision without balance loss for community mobility.     Baseline  NOT MET 09/30/2017 Patient ambulates 500' with cane & prosthesis with min guard.     Time  8    Period  Weeks    Status  On-going    Target Date  11/29/17      PT LONG TERM GOAL #6   Title  Patient negotiates stairs with 1 rail, ramps & curbs with cane, Blind cane & prosthesis with supervision for vision for community access.     Baseline  NOT MET 09/30/2017 MinA for ramp & curb negotiation with cane    Time  8    Period  Weeks    Status  On-going    Target Date  11/29/17      PT LONG TERM GOAL #7   Title  Patient demonstrates work simulation tasks including lifting and reaching with prosthesis safely.      Time  8    Period  Weeks    Status  On-going    Target Date  11/29/17      PT LONG TERM GOAL #8   Title  Patient reports limb pain </= 2/10 with standing activities.     Baseline  MET 10/03/2017    Time  8    Period  Weeks    Status  Achieved            Plan - 10/15/17 1111    Clinical Impression Statement  Today's skilled session continued to address use of cane with vision cane for mobility with occasional assist needed for balance. Pt is progressing toward goals and should benefit from continued PT to progress toward unmet goals.     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 Work towards updated STGs. Monitor wound.    Consulted and Agree with Plan of Care  Patient       Patient will benefit from skilled therapeutic intervention in order to improve the following deficits and impairments:  Abnormal gait, Decreased activity tolerance, Decreased balance, Decreased endurance, Decreased knowledge of use of DME, Decreased mobility, Decreased range of motion, Decreased strength, Impaired flexibility, Impaired vision/preception, Postural dysfunction, Prosthetic Dependency, Pain  Visit Diagnosis: Other abnormalities of gait and mobility  Muscle weakness (generalized)  Unsteadiness on feet     Problem List Patient Active Problem  List   Diagnosis Date Noted  . Acquired absence of right leg below knee ([place]) 03/13/2017  . Diabetic polyneuropathy associated with type 1 diabetes mellitus ([place])   . Diabetes mellitus, new onset ([place])   . Acute renal failure ([place])   . Acute kidney injury ([place]) 02/27/2017  . Hyperglycemia 02/27/2017  . Leukocytosis 02/27/2017    Willow Ora, PTA, [facility] [address] [place], [place] [address] [phone] 10/16/17, 1:08 PM   Name: Seymour Pavlak MRN: [id number] Date of Birth: [birth year]

## 2017-10-18 ENCOUNTER — Ambulatory Visit: Payer: BC Managed Care – PPO | Admitting: Physical Therapy

## 2017-10-18 ENCOUNTER — Encounter: Payer: Self-pay | Admitting: Physical Therapy

## 2017-10-18 DIAGNOSIS — M6281 Muscle weakness (generalized): Secondary | ICD-10-CM | POA: Diagnosis not present

## 2017-10-18 DIAGNOSIS — R2689 Other abnormalities of gait and mobility: Secondary | ICD-10-CM

## 2017-10-18 DIAGNOSIS — R296 Repeated falls: Secondary | ICD-10-CM | POA: Diagnosis not present

## 2017-10-18 DIAGNOSIS — R2681 Unsteadiness on feet: Secondary | ICD-10-CM | POA: Diagnosis not present

## 2017-10-18 DIAGNOSIS — M79604 Pain in right leg: Secondary | ICD-10-CM | POA: Diagnosis not present

## 2017-10-18 DIAGNOSIS — M25661 Stiffness of right knee, not elsewhere classified: Secondary | ICD-10-CM | POA: Diagnosis not present

## 2017-10-18 NOTE — Therapy (Signed)
[facility] [address] [place], [place], [address] Phone: [phone]   Fax:  [phone]  Physical Therapy Treatment  Patient Details  Name: Samuel Roth MRN: [id number] Date of Birth: 1951-06-26 Referring Provider: Meridee Score, MD   Encounter Date: 10/18/2017  PT End of Session - 10/18/17 1111    Visit Number  17    Number of Visits  31    Date for PT Re-Evaluation  11/29/17    Authorization Type  BCBS state primary & Medicare 2nd    Authorization Time Period  $1250 deductable met; 0 visit limit, no auth    PT Start Time  1105    PT Stop Time  1145    PT Time Calculation (min)  40 min    Equipment Utilized During Treatment  Gait belt    Activity Tolerance  Patient tolerated treatment well    Behavior During Therapy  WFL for tasks assessed/performed       Past Medical History:  Diagnosis Date  . Glaucoma   . Legally blind     Past Surgical History:  Procedure Laterality Date  . AMPUTATION Right 03/01/2017   Procedure: RIGHT TRANS METATARSAL AMPUTATION;  Surgeon: Newt Minion, MD;  Location: [facility];  Service: Orthopedics;  Laterality: Right;  . AMPUTATION Right 03/03/2017   Procedure: RIGHT BKA;  Surgeon: Newt Minion, MD;  Location: [facility];  Service: Orthopedics;  Laterality: Right;  . EYE SURGERY    . TIBIA FRACTURE SURGERY      There were no vitals filed for this visit.  Subjective Assessment - 10/18/17 1109    Subjective  No new complaints. No falls or pain to report.     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  No/denies    Pain Score  0-No pain         [facility] Adult PT Treatment/Exercise - 10/18/17 1112      Transfers   Transfers  Sit to Stand;Stand to Sit    Sit to Stand  5: Supervision;With upper extremity assist;With armrests;From chair/3-in-1    Sit to Stand Details  Verbal cues for precautions/safety;Verbal cues for safe use of DME/AE    Stand to Sit  5: Supervision;With upper extremity assist;To chair/3-in-1;With armrests    Stand to Sit Details (indicate cue type and reason)  Verbal cues for precautions/safety;Verbal cues for safe use of DME/AE      Ambulation/Gait   Ambulation/Gait  Yes    Ambulation/Gait Assistance  5: Supervision;4: Min guard    Ambulation/Gait Assistance Details  no balance loss today on level indoor surfaces. had pt walk across red mats (bean bags under 1st to create more compliant surface as found outdoors)  with cones scattered in between to simulate obsacles one might encounter in the community/home with min guard assist. on one cone not found with pt kickingi it with left foot.      Ambulation Distance (Feet)  220 Feet x1, 120 x1 with obstacles    Assistive device  Straight cane;Prosthesis    Gait Pattern  Decreased step length - left;Right hip hike;Step-through pattern;Decreased arm swing - right;Decreased stance time - right;Decreased stride length;Left flexed knee in stance;Antalgic  Ambulation Surface  Level;Indoor      Therapeutic Activites    Lifting  demo'd technique for lifting case of soda floor<>cart (pt uses cart to transfer them, does not carry them). mod assist with cues on 1st rep, min assist with cues on 2cd rep. needs additional practice.       Prosthetics   Current prosthetic wear tolerance (days/week)   daily    Current prosthetic wear tolerance (#hours/day)   10-12 hours total a day, taking breaks midday most days    Residual limb condition   2 small blisters in place on medial femoral condyle with dry, patchy skin around them. still covered with Tegaderm. no signs of infection.     Education Provided  Skin check;Residual limb care     Person(s) Educated  Patient    Education Method  Explanation;Demonstration;Verbal cues    Education Method  Verbalized understanding;Returned demonstration    Donning Prosthesis  Supervision    Doffing Prosthesis  Modified independent (device/increased time)          PT Short Term Goals - 10/03/17 1205      PT SHORT TERM GOAL #1   Title  Patient verbalizes proper care of residual limb including adjusting ply socks & sweat management.  (All STGs Target Date 11/01/17)    Time  4    Period  Weeks    Status  New    Target Date  11/01/17      PT SHORT TERM GOAL #2   Title  Patient tolerates wear of prosthesis >15 hrs total /day without skin issues.     Time  4    Period  Weeks    Status  New    Target Date  11/01/17      PT SHORT TERM GOAL #3   Title  Patient ambulates 300' with straight cane, Blind cane & prosthesis outdoors including ramps & curbs with supervision.     Time  4    Period  Weeks    Status  New    Target Date  11/01/17      PT SHORT TERM GOAL #4   Title  Patient ambulates 63' with prosthesis only with min guard.     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    Time  4    Period  Weeks    Status  New    Target Date  11/01/17        PT Long Term Goals - 10/03/17 1159      PT LONG TERM GOAL #1   Title  Patient demonstrates & verbalizes proper prosthetic care to enable safe prosthesis use. (All LTGs Target Date 11/29/17)    Baseline  Progressing 10/03/2017 Patient requires cues for skin management as he presented with 2nd blister on medial proximal knee.     Time  8    Period  Weeks    Status  On-going    Target Date  11/29/17      PT LONG TERM GOAL #2   Title  Patient tolerates prosthesis wear >90% of awake hours without skin issues or limb pain to enable function throughout his day.     Baseline  Progressing 10/03/2017  Patient is wearing prosthesis 7hrs 2x/day which is ~75%  of awake hours. He developed 2nd blister week of 09/30/17.     Time  8    Period  Weeks    Status  On-going    Target Date  11/29/17      PT LONG TERM GOAL #3   Title  Berg Balance >/= 40/56 to indicate lower fall risk.     Baseline  Progressed to 34/56 with initial LTG 36/56 on 09/30/2017    Time  8    Period  Weeks    Status  Revised    Target Date  11/29/17      PT LONG TERM GOAL #4   Title  Patient ambulates around furniture with prosthesis only with assist for vision (reports independent in his apt).     Baseline  NOT MET 09/30/2017  patient requires minA for gait with prosthesis only.     Time  8    Period  Weeks    Status  On-going    Target Date  11/29/17      PT LONG TERM GOAL #5   Title  Patient ambulates 500' with straight cane, Blind cane & prosthesis with supervision for vision without balance loss for community mobility.     Baseline  NOT MET 09/30/2017 Patient ambulates 500' with cane & prosthesis with min guard.     Time  8    Period  Weeks    Status  On-going    Target Date  11/29/17      PT LONG TERM GOAL #6   Title  Patient negotiates stairs with 1 rail, ramps & curbs with cane, Blind cane & prosthesis with supervision for vision for community access.     Baseline  NOT MET 09/30/2017 MinA for ramp & curb negotiation with cane    Time  8    Period  Weeks    Status  On-going    Target Date  11/29/17      PT LONG TERM GOAL #7   Title  Patient demonstrates work simulation tasks including lifting and reaching with prosthesis safely.     Time  8    Period  Weeks    Status  On-going    Target Date  11/29/17      PT LONG TERM GOAL #8   Title  Patient reports limb pain </= 2/10 with standing activities.     Baseline  MET 10/03/2017    Time  8    Period  Weeks    Status  Achieved            Plan - 10/18/17 1112    Clinical Impression Statement  Today's skilled session continued to work on use  of cane and vision cane with gait/barriers, including on complaint surfaces. 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 Pt needed min to mod assist for balance with cues to slow down and on correct technique. Pt will need additional practice with this. Pt is progressing toward goals and should benefit from continued PT to progress toward unmet goals.     Rehab Potential  Good    Clinical Impairments Affecting Rehab Potential  right TTA, DM1, neuropathy, acute renal failure, COPD, glaucoma, legally blind, HTN,     PT Frequency  2x / week    PT Duration  8 weeks    PT Treatment/Interventions  ADLs/Self Care Home Management;DME Instruction;Gait training;Stair training;Functional mobility training;Therapeutic activities;Therapeutic exercise;Balance training;Neuromuscular re-education;Patient/family education;Prosthetic Training  PT Next Visit Plan  pt has dress shoes he wants to try walking in for an upcoming wedding that have a slight heel to them, he is to bring them to his next session; continue to work on lifitng to/from floor simulating 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 Work towards updated STGs. Monitor wound.    Consulted and Agree with Plan of Care  Patient       Patient will benefit from skilled therapeutic intervention in order to improve the following deficits and impairments:  Abnormal gait, Decreased activity tolerance, Decreased balance, Decreased endurance, Decreased knowledge of use of DME, Decreased mobility, Decreased range of motion, Decreased strength, Impaired flexibility, Impaired vision/preception, Postural dysfunction, Prosthetic Dependency, Pain  Visit Diagnosis: Other abnormalities of gait and mobility  Muscle weakness (generalized)  Unsteadiness on feet     Problem List Patient Active  Problem List   Diagnosis Date Noted  . Acquired absence of right leg below knee ([facility]) 03/13/2017  . Diabetic polyneuropathy associated with type 1 diabetes mellitus ([facility])   . Diabetes mellitus, new onset ([place])   . Acute renal failure ([facility])   . Acute kidney injury ([place]) 02/27/2017  . Hyperglycemia 02/27/2017  . Leukocytosis 02/27/2017    Willow Ora, PTA, [facility] [address] [place], [place] [address] [phone] 10/18/17, 4:10 PM   Name: Samuel Roth MRN: [id number] Date of Birth: 11-23-1950

## 2017-10-22 ENCOUNTER — Ambulatory Visit: Payer: BC Managed Care – PPO | Admitting: Physical Therapy

## 2017-10-22 ENCOUNTER — Encounter: Payer: Self-pay | Admitting: Physical Therapy

## 2017-10-22 DIAGNOSIS — R2681 Unsteadiness on feet: Secondary | ICD-10-CM

## 2017-10-22 DIAGNOSIS — M79604 Pain in right leg: Secondary | ICD-10-CM | POA: Diagnosis not present

## 2017-10-22 DIAGNOSIS — M25661 Stiffness of right knee, not elsewhere classified: Secondary | ICD-10-CM | POA: Diagnosis not present

## 2017-10-22 DIAGNOSIS — M6281 Muscle weakness (generalized): Secondary | ICD-10-CM

## 2017-10-22 DIAGNOSIS — R2689 Other abnormalities of gait and mobility: Secondary | ICD-10-CM | POA: Diagnosis not present

## 2017-10-22 DIAGNOSIS — R296 Repeated falls: Secondary | ICD-10-CM | POA: Diagnosis not present

## 2017-10-22 NOTE — Therapy (Signed)
[facility] [address] [place], [place], [address] Phone: [phone]   Fax:  [phone]  Physical Therapy Treatment  Patient Details  Name: Samuel Roth MRN: [id number] Date of Birth: 03-13-1951 Referring Provider: Meridee Score, MD   Encounter Date: 10/22/2017  PT End of Session - 10/22/17 1423    Visit Number  18    Number of Visits  31    Date for PT Re-Evaluation  11/29/17    Authorization Type  BCBS state primary & Medicare 2nd    Authorization Time Period  $1250 deductable met; 0 visit limit, no auth    PT Start Time  1100    PT Stop Time  1148    PT Time Calculation (min)  48 min    Equipment Utilized During Treatment  Gait belt    Activity Tolerance  Patient tolerated treatment well    Behavior During Therapy  WFL for tasks assessed/performed       Past Medical History:  Diagnosis Date  . Glaucoma   . Legally blind     Past Surgical History:  Procedure Laterality Date  . AMPUTATION Right 03/01/2017   Procedure: RIGHT TRANS METATARSAL AMPUTATION;  Surgeon: Newt Minion, MD;  Location: [facility];  Service: Orthopedics;  Laterality: Right;  . AMPUTATION Right 03/03/2017   Procedure: RIGHT BKA;  Surgeon: Newt Minion, MD;  Location: [facility];  Service: Orthopedics;  Laterality: Right;  . EYE SURGERY    . TIBIA FRACTURE SURGERY      There were no vitals filed for this visit.  Subjective Assessment - 10/22/17 1042    Subjective  He reports wearing prosthesis 12 hrs. He donnes in morning ~2 hrs after arising and removes within 5 minutes of bedtime.     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  No/denies                       [facility] Adult PT Treatment/Exercise - 10/22/17 1100      Transfers   Transfers  Sit to Stand;Stand to Sit    Sit to Stand  5: Supervision;With upper extremity assist;With armrests;From chair/3-in-1    Sit to Stand Details  Verbal cues for precautions/safety;Verbal cues for safe use of DME/AE    Stand to Sit  5: Supervision;With upper extremity assist;To chair/3-in-1;With armrests    Stand to Sit Details (indicate cue type and reason)  Verbal cues for precautions/safety;Verbal cues for safe use of DME/AE      Ambulation/Gait   Ambulation/Gait  Yes    Ambulation/Gait Assistance  5: Supervision;4: Min guard supervision cane/blind cane, min guard no AD    Ambulation/Gait Assistance Details  verbal cues for full step length & proper step width, upright posture;  Gait without AD with cues noted previosly.     Ambulation Distance (Feet)  250 Feet 250' cane/blind cane, 5' X 5 no AD    Assistive device  Straight cane;Prosthesis;None    Gait Pattern  Decreased step length - left;Right hip hike;Step-through pattern;Decreased arm swing - right;Decreased stance time - right;Decreased stride length;Left flexed knee in stance;Antalgic    Ambulation Surface  Indoor;Level    Stairs  Yes    Stairs Assistance  5: Supervision    Stairs Assistance Details (indicate cue type and reason)  verbal cues on posture & wt shift    Ramp  5: Supervision min guard, straight cane, blind cane & prosthesis    Ramp Details (indicate cue type and reason)  cues on posture & wt shift    Curb  5: Supervision min guard, straight cane, blind cane & prosthesis    Curb Details (indicate cue type and reason)  verbal cues on using blind cane to sense edge & depth of step, step thru for balance      Self-Care   Self-Care  ADL's    ADL's  moving pots especially heavy or hot with towel or pot holder / sliding along counter for safety,  Bending to lower cabinet with LUE support on counter to retrieve pot  from bottom shelf.       Therapeutic Activites    Lifting  lifting 12-pack sodas from floor to rolling cart, from lower shelf & from chest ht shelf to rolling care with tactile & verbal cues.       Prosthetics   Prosthetic Care Comments   Initiate wear upon arising. Doing standing activities /ADLs without prosthesis increases fall risk.  Patient to see prosthetist for blister on medial femoral condyle.     Current prosthetic wear tolerance (days/week)   daily    Current prosthetic wear tolerance (#hours/day)   10-12 hours total a day, taking breaks midday most days    Residual limb condition   2 small blisters in place on medial femoral condyle with dry, patchy skin around them. still covered with Tegaderm. no signs of infection.     Education Provided  Skin check;Residual limb care;Proper wear schedule/adjustment    Person(s) Educated  Patient    Education Method  Explanation;Demonstration;Tactile cues;Verbal cues    Education Method  Verbalized understanding;Returned demonstration;Tactile cues required;Verbal cues required;Needs further instruction               PT Short Term Goals - 10/22/17 1423      PT SHORT TERM GOAL #1   Title  Patient verbalizes proper care of residual limb including adjusting ply socks & sweat management.  (All STGs Target Date 11/01/17)    Time  4    Period  Weeks    Status  On-going    Target Date  11/01/17      PT SHORT TERM GOAL #2   Title  Patient tolerates wear of prosthesis >15 hrs total /day without skin issues.     Time  4    Period  Weeks    Status  On-going    Target Date  11/01/17      PT SHORT TERM GOAL #3   Title  Patient ambulates 300' with straight cane, Blind cane & prosthesis outdoors including ramps & curbs with supervision.     Time  4    Period  Weeks    Status  On-going    Target Date  11/01/17      PT SHORT TERM GOAL #4   Title  Patient ambulates 48' with prosthesis only with min guard.     Time  4    Period  Weeks     Status  On-going    Target Date  11/01/17      PT SHORT TERM GOAL #5   Title  Patient simulates work tasks of loading vending machine with minA.  Time  4    Period  Weeks    Status  On-going    Target Date  11/01/17        PT Long Term Goals - 10/03/17 1159      PT LONG TERM GOAL #1   Title  Patient demonstrates & verbalizes proper prosthetic care to enable safe prosthesis use. (All LTGs Target Date 11/29/17)    Baseline  Progressing 10/03/2017 Patient requires cues for skin management as he presented with 2nd blister on medial proximal knee.     Time  8    Period  Weeks    Status  On-going    Target Date  11/29/17      PT LONG TERM GOAL #2   Title  Patient tolerates prosthesis wear >90% of awake hours without skin issues or limb pain to enable function throughout his day.     Baseline  Progressing 10/03/2017 Patient is wearing prosthesis 7hrs 2x/day which is ~75% of awake hours. He developed 2nd blister week of 09/30/17.     Time  8    Period  Weeks    Status  On-going    Target Date  11/29/17      PT LONG TERM GOAL #3   Title  Berg Balance >/= 40/56 to indicate lower fall risk.     Baseline  Progressed to 34/56 with initial LTG 36/56 on 09/30/2017    Time  8    Period  Weeks    Status  Revised    Target Date  11/29/17      PT LONG TERM GOAL #4   Title  Patient ambulates around furniture with prosthesis only with assist for vision (reports independent in his apt).     Baseline  NOT MET 09/30/2017  patient requires minA for gait with prosthesis only.     Time  8    Period  Weeks    Status  On-going    Target Date  11/29/17      PT LONG TERM GOAL #5   Title  Patient ambulates 500' with straight cane, Blind cane & prosthesis with supervision for vision without balance loss for community mobility.     Baseline  NOT MET 09/30/2017 Patient ambulates 500' with cane & prosthesis with min guard.     Time  8    Period  Weeks    Status  On-going    Target Date  11/29/17       PT LONG TERM GOAL #6   Title  Patient negotiates stairs with 1 rail, ramps & curbs with cane, Blind cane & prosthesis with supervision for vision for community access.     Baseline  NOT MET 09/30/2017 MinA for ramp & curb negotiation with cane    Time  8    Period  Weeks    Status  On-going    Target Date  11/29/17      PT LONG TERM GOAL #7   Title  Patient demonstrates work simulation tasks including lifting and reaching with prosthesis safely.     Time  8    Period  Weeks    Status  On-going    Target Date  11/29/17      PT LONG TERM GOAL #8   Title  Patient reports limb pain </= 2/10 with standing activities.     Baseline  MET 10/03/2017    Time  8    Period  Weeks    Status  Achieved  Plan - 10/22/17 1424    Clinical Impression Statement  Patient improved working in kitchen ambulating up to 5' between table & counter without assistive device with minimal guard. Patient has another blister on medial femoral condyle and PT set-up appointment with prosthetist.     Rehab Potential  Good    Clinical Impairments Affecting Rehab Potential  right TTA, DM1, neuropathy, acute renal failure, COPD, glaucoma, legally blind, HTN,     PT Frequency  2x / week    PT Duration  8 weeks    PT Treatment/Interventions  ADLs/Self Care Home Management;DME Instruction;Gait training;Stair training;Functional mobility training;Therapeutic activities;Therapeutic exercise;Balance training;Neuromuscular re-education;Patient/family education;Prosthetic Training    PT Next Visit Plan  pt has dress shoes he wants to try walking in for an upcoming wedding that have a slight heel to them, he is to bring them to his next session; continue to work on lifitng to/from floor simulating 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 Work towards updated STGs. Monitor wound.    Consulted and  Agree with Plan of Care  Patient       Patient will benefit from skilled therapeutic intervention in order to improve the following deficits and impairments:  Abnormal gait, Decreased activity tolerance, Decreased balance, Decreased endurance, Decreased knowledge of use of DME, Decreased mobility, Decreased range of motion, Decreased strength, Impaired flexibility, Impaired vision/preception, Postural dysfunction, Prosthetic Dependency, Pain  Visit Diagnosis: Other abnormalities of gait and mobility  Muscle weakness (generalized)  Unsteadiness on feet     Problem List Patient Active Problem List   Diagnosis Date Noted  . Acquired absence of right leg below knee ([facility]) 03/13/2017  . Diabetic polyneuropathy associated with type 1 diabetes mellitus ([place])   . Diabetes mellitus, new onset ([place])   . Acute renal failure ([place])   . Acute kidney injury ([facility]) 02/27/2017  . Hyperglycemia 02/27/2017  . Leukocytosis 02/27/2017    Shayonna Ocampo PT, DPT 10/22/2017, 2:36 PM  [facility] [address] [place], [place], [address] Phone: [phone]   Fax:  [phone]  Name: Samuel Roth MRN: [id number] Date of Birth: 03/28/1951

## 2017-10-24 ENCOUNTER — Encounter: Payer: Self-pay | Admitting: Physical Therapy

## 2017-10-24 ENCOUNTER — Ambulatory Visit: Payer: BC Managed Care – PPO | Admitting: Physical Therapy

## 2017-10-24 DIAGNOSIS — M6281 Muscle weakness (generalized): Secondary | ICD-10-CM | POA: Diagnosis not present

## 2017-10-24 DIAGNOSIS — R2689 Other abnormalities of gait and mobility: Secondary | ICD-10-CM | POA: Diagnosis not present

## 2017-10-24 DIAGNOSIS — M25661 Stiffness of right knee, not elsewhere classified: Secondary | ICD-10-CM | POA: Diagnosis not present

## 2017-10-24 DIAGNOSIS — R296 Repeated falls: Secondary | ICD-10-CM | POA: Diagnosis not present

## 2017-10-24 DIAGNOSIS — M79604 Pain in right leg: Secondary | ICD-10-CM | POA: Diagnosis not present

## 2017-10-24 DIAGNOSIS — R2681 Unsteadiness on feet: Secondary | ICD-10-CM

## 2017-10-24 NOTE — Therapy (Signed)
[facility] [address] [place], [place], [address] Phone: [phone]   Fax:  [phone]  Physical Therapy Treatment  Patient Details  Name: Samuel Roth MRN: [id number] Date of Birth: 12/06/1950 Referring Provider: Meridee Score, MD   Encounter Date: 10/24/2017  PT End of Session - 10/24/17 1105    Visit Number  19    Number of Visits  31    Date for PT Re-Evaluation  11/29/17    Authorization Type  BCBS state primary & Medicare 2nd    Authorization Time Period  $1250 deductable met; 0 visit limit, no auth    PT Start Time  1102    PT Stop Time  1144    PT Time Calculation (min)  42 min    Equipment Utilized During Treatment  Gait belt    Activity Tolerance  Patient tolerated treatment well    Behavior During Therapy  WFL for tasks assessed/performed       Past Medical History:  Diagnosis Date  . Glaucoma   . Legally blind     Past Surgical History:  Procedure Laterality Date  . AMPUTATION Right 03/01/2017   Procedure: RIGHT TRANS METATARSAL AMPUTATION;  Surgeon: Newt Minion, MD;  Location: [facility];  Service: Orthopedics;  Laterality: Right;  . AMPUTATION Right 03/03/2017   Procedure: RIGHT BKA;  Surgeon: Newt Minion, MD;  Location: [facility];  Service: Orthopedics;  Laterality: Right;  . EYE SURGERY    . TIBIA FRACTURE SURGERY      There were no vitals filed for this visit.  Subjective Assessment - 10/24/17 1105    Subjective  No new complaints. No falls or pain to report.     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  No/denies    Pain Score  0-No pain            [facility] Adult PT Treatment/Exercise - 10/24/17 1106       Transfers   Transfers  Sit to Stand;Stand to Sit    Sit to Stand  5: Supervision;With upper extremity assist;With armrests;From chair/3-in-1    Sit to Stand Details  Verbal cues for precautions/safety;Verbal cues for safe use of DME/AE    Stand to Sit  5: Supervision;With upper extremity assist;To chair/3-in-1;With armrests    Stand to Sit Details (indicate cue type and reason)  Verbal cues for precautions/safety;Verbal cues for safe use of DME/AE    Floor to Transfer  4: Min guard    Floor to Transfer Details (indicate cue type and reason)  on red mat: from standing to quadruped back to standing with cues on technique/sequencing. use of hands on floor only, no exertal support/assistance needed.     Floor to Transfer Details  Verbal cues for precautions/safety;Verbal cues for sequencing      Ambulation/Gait   Ambulation/Gait  Yes    Ambulation/Gait Assistance  5: Supervision    Ambulation/Gait Assistance Details  used cane/sight cane to negotiate around a busy gym enviroment with direction changes and obstacle/furniture negotiation as well; then worked on gait with no AD with up to min assist for balance. cues needed on posture, step length and weight shifting  Ambulation Distance (Feet)  450 Feet x1, 115 x1    Assistive device  Straight cane;Prosthesis;Other (Comment);None sight cane    Gait Pattern  Decreased step length - left;Right hip hike;Step-through pattern;Decreased arm swing - right;Decreased stance time - right;Decreased stride length;Left flexed knee in stance;Antalgic    Ambulation Surface  Level;Indoor    Stairs  Yes    Stairs Assistance  5: Supervision    Stairs Assistance Details (indicate cue type and reason)  cues for weight shifting and to advance hands along rails    Stair Management Technique  One rail Right;One rail Left;Step to pattern;Forwards;With cane    Number of Stairs  4 x2 reps    Height of Stairs  6      Prosthetics   Current prosthetic wear  tolerance (days/week)   daily    Current prosthetic wear tolerance (#hours/day)   10-12 hours total a day, taking breaks midday most days    Residual limb condition   2 small blisters in place on medial femoral condyle with dry, patchy skin around them. covered with Tegaderm. no signs of infection.     Education Provided  Skin check;Residual limb care;Proper wear schedule/adjustment    Person(s) Educated  Patient    Education Method  Explanation;Verbal cues    Education Method  Verbalized understanding;Tactile cues required;Verbal cues required    Donning Prosthesis  Supervision    Doffing Prosthesis  Modified independent (device/increased time)               PT Short Term Goals - 10/22/17 1423      PT SHORT TERM GOAL #1   Title  Patient verbalizes proper care of residual limb including adjusting ply socks & sweat management.  (All STGs Target Date 11/01/17)    Time  4    Period  Weeks    Status  On-going    Target Date  11/01/17      PT SHORT TERM GOAL #2   Title  Patient tolerates wear of prosthesis >15 hrs total /day without skin issues.     Time  4    Period  Weeks    Status  On-going    Target Date  11/01/17      PT SHORT TERM GOAL #3   Title  Patient ambulates 300' with straight cane, Blind cane & prosthesis outdoors including ramps & curbs with supervision.     Time  4    Period  Weeks    Status  On-going    Target Date  11/01/17      PT SHORT TERM GOAL #4   Title  Patient ambulates 74' with prosthesis only with min guard.     Time  4    Period  Weeks    Status  On-going    Target Date  11/01/17      PT SHORT TERM GOAL #5   Title  Patient simulates work tasks of loading vending machine with minA.    Time  4    Period  Weeks    Status  On-going    Target Date  11/01/17        PT Long Term Goals - 10/03/17 1159      PT LONG TERM GOAL #1   Title  Patient demonstrates & verbalizes proper prosthetic care to enable safe prosthesis use. (All LTGs Target  Date 11/29/17)    Baseline  Progressing 10/03/2017 Patient requires cues for skin management as he presented with 2nd blister  on medial proximal knee.     Time  8    Period  Weeks    Status  On-going    Target Date  11/29/17      PT LONG TERM GOAL #2   Title  Patient tolerates prosthesis wear >90% of awake hours without skin issues or limb pain to enable function throughout his day.     Baseline  Progressing 10/03/2017 Patient is wearing prosthesis 7hrs 2x/day which is ~75% of awake hours. He developed 2nd blister week of 09/30/17.     Time  8    Period  Weeks    Status  On-going    Target Date  11/29/17      PT LONG TERM GOAL #3   Title  Berg Balance >/= 40/56 to indicate lower fall risk.     Baseline  Progressed to 34/56 with initial LTG 36/56 on 09/30/2017    Time  8    Period  Weeks    Status  Revised    Target Date  11/29/17      PT LONG TERM GOAL #4   Title  Patient ambulates around furniture with prosthesis only with assist for vision (reports independent in his apt).     Baseline  NOT MET 09/30/2017  patient requires minA for gait with prosthesis only.     Time  8    Period  Weeks    Status  On-going    Target Date  11/29/17      PT LONG TERM GOAL #5   Title  Patient ambulates 500' with straight cane, Blind cane & prosthesis with supervision for vision without balance loss for community mobility.     Baseline  NOT MET 09/30/2017 Patient ambulates 500' with cane & prosthesis with min guard.     Time  8    Period  Weeks    Status  On-going    Target Date  11/29/17      PT LONG TERM GOAL #6   Title  Patient negotiates stairs with 1 rail, ramps & curbs with cane, Blind cane & prosthesis with supervision for vision for community access.     Baseline  NOT MET 09/30/2017 MinA for ramp & curb negotiation with cane    Time  8    Period  Weeks    Status  On-going    Target Date  11/29/17      PT LONG TERM GOAL #7   Title  Patient demonstrates work simulation tasks  including lifting and reaching with prosthesis safely.     Time  8    Period  Weeks    Status  On-going    Target Date  11/29/17      PT LONG TERM GOAL #8   Title  Patient reports limb pain </= 2/10 with standing activities.     Baseline  MET 10/03/2017    Time  8    Period  Weeks    Status  Achieved            Plan - 10/24/17 1106    Clinical Impression Statement  Today's skilled session focused on gait with canes, no AD's and barriers as well. Pt is making progress toward goals and should benefit from continued PT to progress toward unmet goals.     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 Work towards updated STGs. Monitor wound.    Consulted and Agree with Plan of Care  Patient       Patient will benefit from skilled therapeutic intervention in order to improve the following deficits and impairments:  Abnormal gait, Decreased activity tolerance, Decreased balance, Decreased endurance, Decreased knowledge of use of DME, Decreased mobility, Decreased range of motion, Decreased strength, Impaired flexibility, Impaired vision/preception, Postural dysfunction, Prosthetic Dependency, Pain  Visit Diagnosis: Other abnormalities of gait and mobility  Muscle weakness (generalized)  Unsteadiness on feet     Problem  List Patient Active Problem List   Diagnosis Date Noted  . Acquired absence of right leg below knee ([facility]) 03/13/2017  . Diabetic polyneuropathy associated with type 1 diabetes mellitus ([place])   . Diabetes mellitus, new onset ([place])   . Acute renal failure ([place])   . Acute kidney injury ([place]) 02/27/2017  . Hyperglycemia 02/27/2017  . Leukocytosis 02/27/2017    Willow Ora, PTA, [facility] [address] [place], [place] [address] [phone] 10/24/17, 9:54 PM   Name: Samuel Roth MRN: [id number] Date of Birth: 09/12/1951

## 2017-10-29 ENCOUNTER — Ambulatory Visit: Payer: BC Managed Care – PPO | Admitting: Physical Therapy

## 2017-10-29 ENCOUNTER — Encounter: Payer: Self-pay | Admitting: Physical Therapy

## 2017-10-29 DIAGNOSIS — M79604 Pain in right leg: Secondary | ICD-10-CM | POA: Diagnosis not present

## 2017-10-29 DIAGNOSIS — R2681 Unsteadiness on feet: Secondary | ICD-10-CM | POA: Diagnosis not present

## 2017-10-29 DIAGNOSIS — R2689 Other abnormalities of gait and mobility: Secondary | ICD-10-CM | POA: Diagnosis not present

## 2017-10-29 DIAGNOSIS — M6281 Muscle weakness (generalized): Secondary | ICD-10-CM | POA: Diagnosis not present

## 2017-10-29 DIAGNOSIS — M25661 Stiffness of right knee, not elsewhere classified: Secondary | ICD-10-CM | POA: Diagnosis not present

## 2017-10-29 DIAGNOSIS — R296 Repeated falls: Secondary | ICD-10-CM | POA: Diagnosis not present

## 2017-10-29 NOTE — Therapy (Signed)
[facility] [address] [place], [place], [address] Phone: [phone]   Fax:  [phone]  Physical Therapy Treatment  Patient Details  Name: Samuel Roth MRN: [id number] Date of Birth: Apr 11, 1951 Referring Provider: Meridee Score, MD   Encounter Date: 10/29/2017  PT End of Session - 10/29/17 2203    Visit Number  20    Number of Visits  31    Date for PT Re-Evaluation  11/29/17    Authorization Type  BCBS state primary & Medicare 2nd PT sent recert 66/06 visit #00 so progress note at visit 25    Authorization Time Period  $1250 deductable met; 0 visit limit, no auth    PT Start Time  1102    PT Stop Time  1146    PT Time Calculation (min)  44 min    Equipment Utilized During Treatment  Gait belt    Activity Tolerance  Patient tolerated treatment well    Behavior During Therapy  [facility] for tasks assessed/performed       Past Medical History:  Diagnosis Date  . Glaucoma   . Legally blind     Past Surgical History:  Procedure Laterality Date  . AMPUTATION Right 03/01/2017   Procedure: RIGHT TRANS METATARSAL AMPUTATION;  Surgeon: Newt Minion, MD;  Location: [facility];  Service: Orthopedics;  Laterality: Right;  . AMPUTATION Right 03/03/2017   Procedure: RIGHT BKA;  Surgeon: Newt Minion, MD;  Location: [facility];  Service: Orthopedics;  Laterality: Right;  . EYE SURGERY    . TIBIA FRACTURE SURGERY      There were no vitals filed for this visit.  Subjective Assessment - 10/29/17 1100    Subjective  No falls. He is wearing prosthesis most awake hours but he reports spending more time in bed when not working.     Patient is accompained by:  Family member    Pertinent History  right TTA, DM1, neuropathy, acute renal failure, COPD, glaucoma, legally blind since birth, HTN,     Limitations  Lifting;Standing;Walking;House hold activities    Patient Stated Goals  walk with prosthesis, work loading vending machines     Currently in Pain?  No/denies                      [facility] Adult PT Treatment/Exercise - 10/29/17 1100      Transfers   Transfers  Sit to Stand;Stand to Sit    Sit to Stand  6: Modified independent (Device/Increase time);With upper extremity assist;With armrests;From chair/3-in-1    Sit to Stand Details  --    Stand to Sit  6: Modified independent (Device/Increase time);With upper extremity assist;With armrests;To chair/3-in-1    Stand to Sit Details (indicate cue type and reason)  --    Floor to Transfer  --    Floor to Transfer Details  --      Ambulation/Gait   Ambulation/Gait  Yes    Ambulation/Gait Assistance  5: Supervision;4: Min guard supervision cane outdoors, Min guard no device    Ambulation/Gait Assistance Details  Patient had no balance losses with cane only indoor surfaces. Verbal cues for environment changes outdoors to transition to ramp & curb.     Ambulation Distance (Feet)  500 Feet 300' indoors cane, 500' outdoor paved, 25' X 4 no AD    Assistive device  Straight cane;Prosthesis;None    Gait Pattern  Decreased step length - left;Right hip hike;Step-through pattern;Decreased arm swing - right;Decreased  stance time - right;Decreased stride length;Left flexed knee in stance;Antalgic    Ambulation Surface  Indoor;Level;Outdoor;Paved    Stairs  Yes    Stairs Assistance  5: Supervision    Stairs Assistance Details (indicate cue type and reason)  Progressed with verbal & manual cues on ascending alternate pattern and desecnding using RLE/prosthetic side control with verbal cues technique.     Stair Management Technique  One rail Right;One rail Left;Step to pattern;Forwards;With cane    Number of Stairs  4 x2 reps    Height of Stairs  6    Ramp  5: Supervision straight cane only    Ramp Details (indicate cue type and reason)  verbal cues on weight shift & posture    Curb  4: Min assist;5: Supervision straight cane only, initially minA for balance loss ascendin     Curb Details (indicate cue type and reason)  PT instructed to lead with cane first ascending or descending  and use step thru pattern for stabilization.       Prosthetics   Current prosthetic wear tolerance (days/week)   daily    Current prosthetic wear tolerance (#hours/day)   reports up to 15hrs /day if working    Residual limb condition   blisters have healed.     Education Provided  Skin check;Residual limb care;Proper wear schedule/adjustment    Person(s) Educated  Patient    Education Method  Explanation;Verbal cues    Education Method  Verbalized understanding;Verbal cues required             PT Education - 10/29/17 1130    Education provided  Yes    Education Details  wife supervising on patient's right side for gait with cane only on paved or indoor surfaces.     Person(s) Educated  Patient;Spouse    Methods  Explanation;Demonstration;Verbal cues    Comprehension  Verbalized understanding       PT Short Term Goals - 10/29/17 2205      PT SHORT TERM GOAL #1   Title  Patient verbalizes proper care of residual limb including adjusting ply socks & sweat management.  (All STGs Target Date 11/01/17)    Baseline  MET 10/29/2017    Time  4    Period  Weeks    Status  Achieved      PT SHORT TERM GOAL #2   Title  Patient tolerates wear of prosthesis >15 hrs total /day without skin issues.     Baseline  Partially MET 10/29/2017 on work days or days he is out of apt. otherwise he reports spending time in bed which limits total wear time.     Time  4    Period  Weeks    Status  Partially Met      PT SHORT TERM GOAL #3   Title  Patient ambulates 300' with straight cane, Blind cane & prosthesis outdoors including ramps & curbs with supervision.     Baseline  MET 10/29/2017    Time  4    Period  Weeks    Status  Achieved      PT SHORT TERM GOAL #4   Title  Patient ambulates 62' with prosthesis only with min guard.     Baseline  MET 10/29/2017    Time  4    Period  Weeks     Status  Achieved      PT SHORT TERM GOAL #5   Title  Patient simulates work tasks of loading  vending machine with minA.    Baseline  MET 10/22/2017    Time  4    Period  Weeks    Status  Achieved        PT Long Term Goals - 10/03/17 1159      PT LONG TERM GOAL #1   Title  Patient demonstrates & verbalizes proper prosthetic care to enable safe prosthesis use. (All LTGs Target Date 11/29/17)    Baseline  Progressing 10/03/2017 Patient requires cues for skin management as he presented with 2nd blister on medial proximal knee.     Time  8    Period  Weeks    Status  On-going    Target Date  11/29/17      PT LONG TERM GOAL #2   Title  Patient tolerates prosthesis wear >90% of awake hours without skin issues or limb pain to enable function throughout his day.     Baseline  Progressing 10/03/2017 Patient is wearing prosthesis 7hrs 2x/day which is ~75% of awake hours. He developed 2nd blister week of 09/30/17.     Time  8    Period  Weeks    Status  On-going    Target Date  11/29/17      PT LONG TERM GOAL #3   Title  Berg Balance >/= 40/56 to indicate lower fall risk.     Baseline  Progressed to 34/56 with initial LTG 36/56 on 09/30/2017    Time  8    Period  Weeks    Status  Revised    Target Date  11/29/17      PT LONG TERM GOAL #4   Title  Patient ambulates around furniture with prosthesis only with assist for vision (reports independent in his apt).     Baseline  NOT MET 09/30/2017  patient requires minA for gait with prosthesis only.     Time  8    Period  Weeks    Status  On-going    Target Date  11/29/17      PT LONG TERM GOAL #5   Title  Patient ambulates 500' with straight cane, Blind cane & prosthesis with supervision for vision without balance loss for community mobility.     Baseline  NOT MET 09/30/2017 Patient ambulates 500' with cane & prosthesis with min guard.     Time  8    Period  Weeks    Status  On-going    Target Date  11/29/17      PT LONG TERM  GOAL #6   Title  Patient negotiates stairs with 1 rail, ramps & curbs with cane, Blind cane & prosthesis with supervision for vision for community access.     Baseline  NOT MET 09/30/2017 MinA for ramp & curb negotiation with cane    Time  8    Period  Weeks    Status  On-going    Target Date  11/29/17      PT LONG TERM GOAL #7   Title  Patient demonstrates work simulation tasks including lifting and reaching with prosthesis safely.     Time  8    Period  Weeks    Status  On-going    Target Date  11/29/17      PT LONG TERM GOAL #8   Title  Patient reports limb pain </= 2/10 with standing activities.     Baseline  MET 10/03/2017    Time  8    Period  Weeks  Status  Achieved            Plan - 10/29/17 2208    Clinical Impression Statement  Patient met all 6 STGs set for 30-day. Patient appears safe to use cane for gait with wife or family assisting / supervising. Patient is on target to meet LTGs.     Rehab Potential  Good    Clinical Impairments Affecting Rehab Potential  right TTA, DM1, neuropathy, acute renal failure, COPD, glaucoma, legally blind, HTN,     PT Frequency  2x / week    PT Duration  8 weeks    PT Treatment/Interventions  ADLs/Self Care Home Management;DME Instruction;Gait training;Stair training;Functional mobility training;Therapeutic activities;Therapeutic exercise;Balance training;Neuromuscular re-education;Patient/family education;Prosthetic Training    PT Next Visit Plan  work towards [place] including work simulation, prosthetic gait indoors with prosthesis only & outdoor with cane.    Consulted and Agree with Plan of Care  Patient       Patient will benefit from skilled therapeutic intervention in order to improve the following deficits and impairments:  Abnormal gait, Decreased activity tolerance, Decreased balance, Decreased endurance, Decreased knowledge of use of DME, Decreased mobility, Decreased range of motion, Decreased strength, Impaired  flexibility, Impaired vision/preception, Postural dysfunction, Prosthetic Dependency, Pain  Visit Diagnosis: Other abnormalities of gait and mobility  Muscle weakness (generalized)  Unsteadiness on feet     Problem List Patient Active Problem List   Diagnosis Date Noted  . Acquired absence of right leg below knee ([place]) 03/13/2017  . Diabetic polyneuropathy associated with type 1 diabetes mellitus ([place])   . Diabetes mellitus, new onset ([place])   . Acute renal failure ([place])   . Acute kidney injury ([place]) 02/27/2017  . Hyperglycemia 02/27/2017  . Leukocytosis 02/27/2017    Mairead Schwarzkopf PT, DPT 10/29/2017, 10:11 PM  [facility] [address] [place], [place], [address] Phone: [phone]   Fax:  [phone]  Name: Samuel Roth MRN: [id number] Date of Birth: [birth year]

## 2017-10-31 ENCOUNTER — Ambulatory Visit: Payer: BC Managed Care – PPO | Admitting: Physical Therapy

## 2017-10-31 ENCOUNTER — Encounter: Payer: Self-pay | Admitting: Physical Therapy

## 2017-10-31 DIAGNOSIS — M6281 Muscle weakness (generalized): Secondary | ICD-10-CM | POA: Diagnosis not present

## 2017-10-31 DIAGNOSIS — R2681 Unsteadiness on feet: Secondary | ICD-10-CM | POA: Diagnosis not present

## 2017-10-31 DIAGNOSIS — M79604 Pain in right leg: Secondary | ICD-10-CM | POA: Diagnosis not present

## 2017-10-31 DIAGNOSIS — M25661 Stiffness of right knee, not elsewhere classified: Secondary | ICD-10-CM | POA: Diagnosis not present

## 2017-10-31 DIAGNOSIS — R2689 Other abnormalities of gait and mobility: Secondary | ICD-10-CM | POA: Diagnosis not present

## 2017-10-31 DIAGNOSIS — R296 Repeated falls: Secondary | ICD-10-CM | POA: Diagnosis not present

## 2017-11-01 NOTE — Therapy (Signed)
[facility] [address] [place], [place], [address] Phone: [phone]   Fax:  [phone]  Physical Therapy Treatment  Patient Details  Name: Samuel Roth MRN: [id number] Date of Birth: 08-Feb-1951 Referring Provider: Meridee Score, MD   Encounter Date: 10/31/2017  PT End of Session - 10/31/17 1107    Visit Number  21    Number of Visits  31    Date for PT Re-Evaluation  11/29/17    Authorization Type  BCBS state primary & Medicare 2nd PT sent recert 07/62 visit #26 so progress note at visit 25    Authorization Time Period  $1250 deductable met; 0 visit limit, no auth    PT Start Time  1103    PT Stop Time  1141    PT Time Calculation (min)  38 min    Equipment Utilized During Treatment  Gait belt    Activity Tolerance  Patient tolerated treatment well    Behavior During Therapy  [facility] for tasks assessed/performed       Past Medical History:  Diagnosis Date  . Glaucoma   . Legally blind     Past Surgical History:  Procedure Laterality Date  . AMPUTATION Right 03/01/2017   Procedure: RIGHT TRANS METATARSAL AMPUTATION;  Surgeon: Newt Minion, MD;  Location: [facility];  Service: Orthopedics;  Laterality: Right;  . AMPUTATION Right 03/03/2017   Procedure: RIGHT BKA;  Surgeon: Newt Minion, MD;  Location: [facility];  Service: Orthopedics;  Laterality: Right;  . EYE SURGERY    . TIBIA FRACTURE SURGERY      There were no vitals filed for this visit.  Subjective Assessment - 10/31/17 1106    Subjective  No new complaints. No falls or pain to report.     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  No/denies           [facility] Adult PT  Treatment/Exercise - 10/31/17 1108      Transfers   Transfers  Sit to Stand;Stand to Sit    Sit to Stand  6: Modified independent (Device/Increase time);With upper extremity assist;With armrests;From chair/3-in-1    Stand to Sit  6: Modified independent (Device/Increase time);With upper extremity assist;With armrests;To chair/3-in-1      Ambulation/Gait   Ambulation/Gait  Yes    Ambulation/Gait Assistance  5: Supervision;4: Min guard supervision with cane    Ambulation Distance (Feet)  450 Feet x1, 230 x1 no AD     Assistive device  Straight cane;Prosthesis;None    Gait Pattern  Decreased step length - left;Right hip hike;Step-through pattern;Decreased arm swing - right;Decreased stance time - right;Decreased stride length;Left flexed knee in stance;Antalgic    Ambulation Surface  Level;Indoor    Stairs  Yes    Stairs Assistance  5: Supervision;4: Min guard    Stairs Assistance Details (indicate cue type and reason)  worked on reciprocal pattern with 2 rails. supervision to ascend, min guard to descend. max cues on technique with descending for foot placement. pt reported he did not like this technique and would not perform this out of therapy therefore did not work more on this.     Stair Management Technique  One rail  Right;One rail Left;Two rails;Alternating pattern;Step to pattern;Forwards;With cane    Number of Stairs  4 x 3 reps    Height of Stairs  6    Ramp  5: Supervision    Ramp Details (indicate cue type and reason)  x1 rep with cane/sight cane with cues on posture/step length    Curb  Other (comment) min guard assist to supervision    Curb Details (indicate cue type and reason)  x3 reps, cues on sequencing and cane placement with ascending curb so to not catch sight cane causing it to colapse      Prosthetics   Current prosthetic wear tolerance (days/week)   daily    Current prosthetic wear tolerance (#hours/day)   reports up to 15hrs /day if working    Residual limb condition    intact with no issues noted    Education Provided  Skin check;Residual limb care;Proper wear schedule/adjustment    Person(s) Educated  Patient    Education Method  Explanation;Verbal cues;Handout    Education Method  Verbalized understanding;Verbal cues required;Needs further instruction    Donning Prosthesis  Supervision    Doffing Prosthesis  Modified independent (device/increased time)            PT Short Term Goals - 10/29/17 2205      PT SHORT TERM GOAL #1   Title  Patient verbalizes proper care of residual limb including adjusting ply socks & sweat management.  (All STGs Target Date 11/01/17)    Baseline  MET 10/29/2017    Time  4    Period  Weeks    Status  Achieved      PT SHORT TERM GOAL #2   Title  Patient tolerates wear of prosthesis >15 hrs total /day without skin issues.     Baseline  Partially MET 10/29/2017 on work days or days he is out of apt. otherwise he reports spending time in bed which limits total wear time.     Time  4    Period  Weeks    Status  Partially Met      PT SHORT TERM GOAL #3   Title  Patient ambulates 300' with straight cane, Blind cane & prosthesis outdoors including ramps & curbs with supervision.     Baseline  MET 10/29/2017    Time  4    Period  Weeks    Status  Achieved      PT SHORT TERM GOAL #4   Title  Patient ambulates 76' with prosthesis only with min guard.     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    Baseline  MET 10/22/2017    Time  4    Period  Weeks    Status  Achieved        PT Long Term Goals - 10/03/17 1159      PT LONG TERM GOAL #1   Title  Patient demonstrates & verbalizes proper prosthetic care to enable safe prosthesis use. (All LTGs Target Date 11/29/17)    Baseline  Progressing 10/03/2017 Patient requires cues for skin management as he presented with 2nd blister on  medial proximal knee.     Time  8    Period  Weeks    Status  On-going  Target Date  11/29/17      PT LONG TERM GOAL #2   Title  Patient tolerates prosthesis wear >90% of awake hours without skin issues or limb pain to enable function throughout his day.     Baseline  Progressing 10/03/2017 Patient is wearing prosthesis 7hrs 2x/day which is ~75% of awake hours. He developed 2nd blister week of 09/30/17.     Time  8    Period  Weeks    Status  On-going    Target Date  11/29/17      PT LONG TERM GOAL #3   Title  Berg Balance >/= 40/56 to indicate lower fall risk.     Baseline  Progressed to 34/56 with initial LTG 36/56 on 09/30/2017    Time  8    Period  Weeks    Status  Revised    Target Date  11/29/17      PT LONG TERM GOAL #4   Title  Patient ambulates around furniture with prosthesis only with assist for vision (reports independent in his apt).     Baseline  NOT MET 09/30/2017  patient requires minA for gait with prosthesis only.     Time  8    Period  Weeks    Status  On-going    Target Date  11/29/17      PT LONG TERM GOAL #5   Title  Patient ambulates 500' with straight cane, Blind cane & prosthesis with supervision for vision without balance loss for community mobility.     Baseline  NOT MET 09/30/2017 Patient ambulates 500' with cane & prosthesis with min guard.     Time  8    Period  Weeks    Status  On-going    Target Date  11/29/17      PT LONG TERM GOAL #6   Title  Patient negotiates stairs with 1 rail, ramps & curbs with cane, Blind cane & prosthesis with supervision for vision for community access.     Baseline  NOT MET 09/30/2017 MinA for ramp & curb negotiation with cane    Time  8    Period  Weeks    Status  On-going    Target Date  11/29/17      PT LONG TERM GOAL #7   Title  Patient demonstrates work simulation tasks including lifting and reaching with prosthesis safely.     Time  8    Period  Weeks    Status  On-going    Target Date  11/29/17       PT LONG TERM GOAL #8   Title  Patient reports limb pain </= 2/10 with standing activities.     Baseline  MET 10/03/2017    Time  8    Period  Weeks    Status  Achieved            Plan - 10/31/17 1107    Clinical Impression Statement  Today's skilled session continued to focus on use of cane vs no AD with gait and barriers with cane. Used sight cane today as well due to today being a poor sight day. Pt is making steady progress toward goals and should benefit from continued PT to progress toward unmet goals.     Rehab Potential  Good    Clinical Impairments Affecting Rehab Potential  right TTA, DM1, neuropathy, acute renal failure, COPD, glaucoma, legally blind, HTN,     PT Frequency  2x / week  PT Duration  8 weeks    PT Treatment/Interventions  ADLs/Self Care Home Management;DME Instruction;Gait training;Stair training;Functional mobility training;Therapeutic activities;Therapeutic exercise;Balance training;Neuromuscular re-education;Patient/family education;Prosthetic Training    PT Next Visit Plan  work towards [place] including work simulation, prosthetic gait indoors with prosthesis only & outdoor with cane.    Consulted and Agree with Plan of Care  Patient       Patient will benefit from skilled therapeutic intervention in order to improve the following deficits and impairments:  Abnormal gait, Decreased activity tolerance, Decreased balance, Decreased endurance, Decreased knowledge of use of DME, Decreased mobility, Decreased range of motion, Decreased strength, Impaired flexibility, Impaired vision/preception, Postural dysfunction, Prosthetic Dependency, Pain  Visit Diagnosis: Other abnormalities of gait and mobility  Muscle weakness (generalized)  Unsteadiness on feet     Problem List Patient Active Problem List   Diagnosis Date Noted  . Acquired absence of right leg below knee ([place]) 03/13/2017  . Diabetic polyneuropathy associated with type 1 diabetes mellitus  ([place])   . Diabetes mellitus, new onset ([place])   . Acute renal failure ([place])   . Acute kidney injury ([place]) 02/27/2017  . Hyperglycemia 02/27/2017  . Leukocytosis 02/27/2017    Willow Ora, PTA, [facility] [address], [place], [place] [address] [phone] 11/01/17, 1:01 PM   Name: Samuel Roth MRN: [id number] Date of Birth: 29-Nov-1950

## 2017-11-05 ENCOUNTER — Encounter: Payer: Self-pay | Admitting: Physical Therapy

## 2017-11-05 ENCOUNTER — Ambulatory Visit: Payer: BC Managed Care – PPO | Admitting: Physical Therapy

## 2017-11-05 DIAGNOSIS — M6281 Muscle weakness (generalized): Secondary | ICD-10-CM

## 2017-11-05 DIAGNOSIS — M79604 Pain in right leg: Secondary | ICD-10-CM | POA: Diagnosis not present

## 2017-11-05 DIAGNOSIS — R2681 Unsteadiness on feet: Secondary | ICD-10-CM | POA: Diagnosis not present

## 2017-11-05 DIAGNOSIS — M25661 Stiffness of right knee, not elsewhere classified: Secondary | ICD-10-CM | POA: Diagnosis not present

## 2017-11-05 DIAGNOSIS — R2689 Other abnormalities of gait and mobility: Secondary | ICD-10-CM | POA: Diagnosis not present

## 2017-11-05 DIAGNOSIS — R296 Repeated falls: Secondary | ICD-10-CM | POA: Diagnosis not present

## 2017-11-06 NOTE — Therapy (Signed)
[facility] [address] [place], [place], [address] Phone: [phone]   Fax:  [phone]  Physical Therapy Treatment  Patient Details  Name: Samuel Roth MRN: [id number] Date of Birth: 04/29/1951 Referring Provider: Meridee Score, MD   Encounter Date: 11/05/2017  PT End of Session - 11/05/17 1642    Visit Number  22    Number of Visits  31    Date for PT Re-Evaluation  11/29/17    Authorization Type  BCBS state primary & Medicare 2nd PT sent recert 83/41 visit #96 so progress note at visit 25    Authorization Time Period  $1250 deductable met; 0 visit limit, no auth    PT Start Time  1102    PT Stop Time  1145    PT Time Calculation (min)  43 min    Equipment Utilized During Treatment  Gait belt    Activity Tolerance  Patient tolerated treatment well    Behavior During Therapy  [facility] for tasks assessed/performed       Past Medical History:  Diagnosis Date  . Glaucoma   . Legally blind     Past Surgical History:  Procedure Laterality Date  . AMPUTATION Right 03/01/2017   Procedure: RIGHT TRANS METATARSAL AMPUTATION;  Surgeon: Newt Minion, MD;  Location: [facility];  Service: Orthopedics;  Laterality: Right;  . AMPUTATION Right 03/03/2017   Procedure: RIGHT BKA;  Surgeon: Newt Minion, MD;  Location: [facility];  Service: Orthopedics;  Laterality: Right;  . EYE SURGERY    . TIBIA FRACTURE SURGERY      There were no vitals filed for this visit.  Subjective Assessment - 11/05/17 1100    Subjective  No falls. He went to wedding and [place] using cane without any issues.     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  No/denies                       [facility] Adult PT Treatment/Exercise - 11/05/17 1100      Transfers   Transfers  Sit to Stand;Stand to Sit    Sit to Stand  6: Modified independent (Device/Increase time);With upper extremity assist;With armrests;From chair/3-in-1    Stand to Sit  6: Modified independent (Device/Increase time);With upper extremity assist;With armrests;To chair/3-in-1      Ambulation/Gait   Ambulation/Gait  Yes    Ambulation/Gait Assistance  5: Supervision;4: Min guard supervision with cane    Ambulation/Gait Assistance Details  using straight (support) cane without blind cane, switching cane between LUE for support & RUE to sense environment. Pt had 2 balance losses which he self-corrected with Min guard from PT for safety.  Household activities negoatiating around obstacles >2' from ground.     Ambulation Distance (Feet)  550 Feet 550' outdoors & 100' X 2 indoors    Assistive device  Straight cane;Prosthesis;None    Gait Pattern  Decreased step length - left;Right hip hike;Step-through pattern;Decreased arm swing - right;Decreased stance time - right;Decreased stride length;Left flexed knee in stance;Antalgic    Ambulation Surface  Outdoor;Paved;Indoor;Level    Stairs  Yes    Stairs Assistance  5: Supervision    Stairs  Assistance Details (indicate cue type and reason)  progressed to alternate pattern ascending and using prosthesis desend step-to for neuromuscular re-ed to raise & lower body wt with prosthetic limb.    Stair Management Technique  One rail Right;One rail Left;Alternating pattern;Step to pattern;Forwards;With cane;Two rails step-to w single rail/cane, alt ascend w 2rails,     Number of Stairs  4 x 3 reps    Height of Stairs  6    Ramp  5: Supervision support cane & prosthesis    Ramp Details (indicate cue type and reason)  verbal cues on weight shift.     Curb  Other (comment);5: Supervision min guard assist to supervision, cane support & prosthesis    Curb  Details (indicate cue type and reason)  Verbal cues on sequence & step thru for balance.       Prosthetics   Prosthetic Care Comments   PT instructed in signs of sweating & drying limb/liner with any sweat. Increase wear to all awake hours and remove to dry prn.  He reported wearing upon arising & using prosthesis in kitchen to make breakfast which helped. PT instructed in using prosthesis to enter/exitng bathroom for bathing. Pt verbalized understanding but uncertain if will work for him.     Current prosthetic wear tolerance (days/week)   daily    Current prosthetic wear tolerance (#hours/day)   reports all awake hours except ~1hr midday & 1-2 hrs prior to bed    Residual limb condition   intact with no issues noted    Education Provided  Skin check;Residual limb care;Proper wear schedule/adjustment;Other (comment) see prosthetic care comments    Person(s) Educated  Patient    Education Method  Explanation;Demonstration;Verbal cues    Education Method  Verbalized understanding;Verbal cues required;Needs further instruction               PT Short Term Goals - 10/29/17 2205      PT SHORT TERM GOAL #1   Title  Patient verbalizes proper care of residual limb including adjusting ply socks & sweat management.  (All STGs Target Date 11/01/17)    Baseline  MET 10/29/2017    Time  4    Period  Weeks    Status  Achieved      PT SHORT TERM GOAL #2   Title  Patient tolerates wear of prosthesis >15 hrs total /day without skin issues.     Baseline  Partially MET 10/29/2017 on work days or days he is out of apt. otherwise he reports spending time in bed which limits total wear time.     Time  4    Period  Weeks    Status  Partially Met      PT SHORT TERM GOAL #3   Title  Patient ambulates 300' with straight cane, Blind cane & prosthesis outdoors including ramps & curbs with supervision.     Baseline  MET 10/29/2017    Time  4    Period  Weeks    Status  Achieved      PT SHORT TERM GOAL #4    Title  Patient ambulates 6' with prosthesis only with min guard.     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    Baseline  MET  10/22/2017    Time  4    Period  Weeks    Status  Achieved        PT Long Term Goals - 10/03/17 1159      PT LONG TERM GOAL #1   Title  Patient demonstrates & verbalizes proper prosthetic care to enable safe prosthesis use. (All LTGs Target Date 11/29/17)    Baseline  Progressing 10/03/2017 Patient requires cues for skin management as he presented with 2nd blister on medial proximal knee.     Time  8    Period  Weeks    Status  On-going    Target Date  11/29/17      PT LONG TERM GOAL #2   Title  Patient tolerates prosthesis wear >90% of awake hours without skin issues or limb pain to enable function throughout his day.     Baseline  Progressing 10/03/2017 Patient is wearing prosthesis 7hrs 2x/day which is ~75% of awake hours. He developed 2nd blister week of 09/30/17.     Time  8    Period  Weeks    Status  On-going    Target Date  11/29/17      PT LONG TERM GOAL #3   Title  Berg Balance >/= 40/56 to indicate lower fall risk.     Baseline  Progressed to 34/56 with initial LTG 36/56 on 09/30/2017    Time  8    Period  Weeks    Status  Revised    Target Date  11/29/17      PT LONG TERM GOAL #4   Title  Patient ambulates around furniture with prosthesis only with assist for vision (reports independent in his apt).     Baseline  NOT MET 09/30/2017  patient requires minA for gait with prosthesis only.     Time  8    Period  Weeks    Status  On-going    Target Date  11/29/17      PT LONG TERM GOAL #5   Title  Patient ambulates 500' with straight cane, Blind cane & prosthesis with supervision for vision without balance loss for community mobility.     Baseline  NOT MET 09/30/2017 Patient ambulates 500' with cane &  prosthesis with min guard.     Time  8    Period  Weeks    Status  On-going    Target Date  11/29/17      PT LONG TERM GOAL #6   Title  Patient negotiates stairs with 1 rail, ramps & curbs with cane, Blind cane & prosthesis with supervision for vision for community access.     Baseline  NOT MET 09/30/2017 MinA for ramp & curb negotiation with cane    Time  8    Period  Weeks    Status  On-going    Target Date  11/29/17      PT LONG TERM GOAL #7   Title  Patient demonstrates work simulation tasks including lifting and reaching with prosthesis safely.     Time  8    Period  Weeks    Status  On-going    Target Date  11/29/17      PT LONG TERM GOAL #8   Title  Patient reports limb pain </= 2/10 with standing activities.     Baseline  MET 10/03/2017    Time  8    Period  Weeks    Status  Achieved  Plan - 11/05/17 1643    Clinical Impression Statement  Patient appears to be using support cane only swtiching cane between LUE for support & RUE for sensing on paved outdoor surfaces. He reports improved ADL with use of prosthesis to make breakfast. He appears to understanding sweat management with prosthesis.     Rehab Potential  Good    Clinical Impairments Affecting Rehab Potential  right TTA, DM1, neuropathy, acute renal failure, COPD, glaucoma, legally blind, HTN,     PT Frequency  2x / week    PT Duration  8 weeks    PT Treatment/Interventions  ADLs/Self Care Home Management;DME Instruction;Gait training;Stair training;Functional mobility training;Therapeutic activities;Therapeutic exercise;Balance training;Neuromuscular re-education;Patient/family education;Prosthetic Training    PT Next Visit Plan  work towards [place] including work simulation, prosthetic gait indoors with prosthesis only & outdoor with cane.    Consulted and Agree with Plan of Care  Patient       Patient will benefit from skilled therapeutic intervention in order to improve the following deficits  and impairments:  Abnormal gait, Decreased activity tolerance, Decreased balance, Decreased endurance, Decreased knowledge of use of DME, Decreased mobility, Decreased range of motion, Decreased strength, Impaired flexibility, Impaired vision/preception, Postural dysfunction, Prosthetic Dependency, Pain  Visit Diagnosis: Other abnormalities of gait and mobility  Muscle weakness (generalized)  Unsteadiness on feet     Problem List Patient Active Problem List   Diagnosis Date Noted  . Acquired absence of right leg below knee ([place]) 03/13/2017  . Diabetic polyneuropathy associated with type 1 diabetes mellitus ([place])   . Diabetes mellitus, new onset ([place])   . Acute renal failure ([place])   . Acute kidney injury ([place]) 02/27/2017  . Hyperglycemia 02/27/2017  . Leukocytosis 02/27/2017    [place]ienna Folden PT, DPT 11/06/2017, 6:46 AM  [facility] [address] [place], [place], [address] Phone: [phone]   Fax:  [phone]  Name: Leilan Bochenek MRN: [id number] Date of Birth: 02-Jun-1951

## 2017-11-07 ENCOUNTER — Ambulatory Visit: Payer: BC Managed Care – PPO | Admitting: Physical Therapy

## 2017-11-07 DIAGNOSIS — M6281 Muscle weakness (generalized): Secondary | ICD-10-CM

## 2017-11-07 DIAGNOSIS — R2681 Unsteadiness on feet: Secondary | ICD-10-CM

## 2017-11-07 DIAGNOSIS — M25661 Stiffness of right knee, not elsewhere classified: Secondary | ICD-10-CM

## 2017-11-07 DIAGNOSIS — R2689 Other abnormalities of gait and mobility: Secondary | ICD-10-CM

## 2017-11-07 DIAGNOSIS — R296 Repeated falls: Secondary | ICD-10-CM

## 2017-11-07 DIAGNOSIS — M79604 Pain in right leg: Secondary | ICD-10-CM | POA: Diagnosis not present

## 2017-11-07 NOTE — Therapy (Signed)
Samuel Roth [address] [place], [place], [address] Phone: [phone]   Fax:  [phone]  Physical Therapy Treatment  Patient Details  Name: Samuel Roth MRN: [id number] Date of Birth: [birth year] Referring Provider: Meridee Score, MD   Encounter Date: 11/07/2017  PT End of Session - 11/07/17 1100    Visit Number  23    Number of Visits  31    Date for PT Re-Evaluation  11/29/17    Authorization Type  BCBS state primary & Medicare 2nd PT sent recert 09/38 visit #18 so progress note at visit 25    Authorization Time Period  $1250 deductable met; 0 visit limit, no auth    PT Start Time  1100    PT Stop Time  1149    PT Time Calculation (min)  49 min    Equipment Utilized During Treatment  Gait belt    Activity Tolerance  Patient tolerated treatment well    Behavior During Therapy  [place] for tasks assessed/performed       Past Medical History:  Diagnosis Date  . Glaucoma   . Legally blind     Past Surgical History:  Procedure Laterality Date  . AMPUTATION Right 03/01/2017   Procedure: RIGHT TRANS METATARSAL AMPUTATION;  Surgeon: Newt Minion, MD;  Location: [facility];  Service: Orthopedics;  Laterality: Right;  . AMPUTATION Right 03/03/2017   Procedure: RIGHT BKA;  Surgeon: Newt Minion, MD;  Location: [facility];  Service: Orthopedics;  Laterality: Right;  . EYE SURGERY    . TIBIA FRACTURE SURGERY      There were no vitals filed for this visit.  Subjective Assessment - 11/07/17 1110    Subjective  pt reports no falls, pt reports he had some R LBP the otherday that had gone down when he takes a sitting break.     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  Yes    Pain Score  2     Pain Location  Back    Pain Orientation  Right    Pain Descriptors / Indicators  Aching;Dull                      [facility] Adult PT Treatment/Exercise - 11/07/17 1100      Transfers   Transfers  Sit to Stand;Stand to Sit    Sit to Stand  6: Modified independent (Device/Increase time)    Stand to Sit  6: Modified independent (Device/Increase time)      Ambulation/Gait   Ambulation/Gait  Yes    Ambulation/Gait Assistance  5: Supervision    Ambulation/Gait Assistance Details  SPC with no loss of balance on level surface or ramps . W/o SPT and on LOB .  pt require Verbal cueing to flex knee for foot clearance     Ambulation Distance (Feet)  845 Feet 500 outside, 345 inside, 20~ w/o cane longest 750    Assistive device  Straight cane    Gait Pattern  Decreased step length - left;Right hip hike;Step-through pattern;Decreased arm swing - right;Decreased stance time - right;Decreased stride length    Ambulation Surface  Outdoor;Paved;Level;Indoor    Stairs  Yes  Stairs Assistance  5: Supervision    Stairs Assistance Details (indicate cue type and reason)  Verbal cues for cane use and signle rail. Pt self corrected often.    Stair Management Technique  One rail Left;One rail Right;Two rails;Alternating pattern;Step to pattern;With cane    Number of Stairs  4 3 sets    Height of Stairs  6    Ramp  5: Supervision    Ramp Details (indicate cue type and reason)  minimal verbal cue for curb (step distance)    Curb  Other (comment) min guard with loss of balance that was self corrected.    Curb Details (indicate cue type and reason)  verbal cues on step length and  distance to step     Gait Comments  pt required moderate verbal cueing for stepping over objects( using cane to locate and estimate size of object in order to take correct step hight and distance to clear.  Also ambulates without loss of balance short distance without single point  cane.       Therapeutic Activites    Therapeutic Activities  Lifting    Lifting  lifting 12-pack sodas with 12# weight attached from floor to low cabinet and back to floor or wast hight table.  Verbal cueing for correct lifting mechanics, body position, and prosthesis locaiton.       Prosthetics   Prosthetic Care Comments   Pt instruced on assessment of Prosthetic hight. insureing it is correct     Current prosthetic wear tolerance (days/week)   daily    Residual limb condition   intact with no issues noted    Person(s) Educated  Patient    Education Method  Explanation;Demonstration    Education Method  Verbalized understanding;Needs further instruction             PT Education - 11/07/17 1150    Education provided  Yes    Education Details  work related lifting and mechanics( placement of Prosthesis next to object being lifted, and to lift straight up towards chest) in order to maintain balance while lifting. Pt also educated on ways to determining if Prosthetic .    Person(s) Educated  Patient    Methods  Tactile cues;Verbal cues;Demonstration;Explanation    Comprehension  Verbalized understanding;Verbal cues required;Need further instruction;Returned demonstration       PT Short Term Goals - 10/29/17 2205      PT SHORT TERM GOAL #1   Title  Patient verbalizes proper care of residual limb including adjusting ply socks & sweat management.  (All STGs Target Date 11/01/17)    Baseline  MET 10/29/2017    Time  4    Period  Weeks    Status  Achieved      PT SHORT TERM GOAL #2   Title  Patient tolerates wear of prosthesis >15 hrs total /day without skin issues.     Baseline  Partially MET 10/29/2017 on work days or days he is out of apt. otherwise he reports spending time in bed which limits total wear time.     Time  4    Period  Weeks    Status  Partially Met      PT SHORT TERM GOAL #3   Title  Patient ambulates 300' with straight cane, Blind cane & prosthesis outdoors  including ramps & curbs with supervision.     Baseline  MET 10/29/2017    Time  4    Period  Weeks  Status  Achieved      PT SHORT TERM GOAL #4   Title  Patient ambulates 101' with prosthesis only with min guard.     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    Baseline  MET 10/22/2017    Time  4    Period  Weeks    Status  Achieved        PT Long Term Goals - 11/07/17 1217      PT LONG TERM GOAL #1   Title  Patient demonstrates & verbalizes proper prosthetic care to enable safe prosthesis use. (All LTGs Target Date 11/29/17)    Baseline  Progressing 10/03/2017 Patient requires cues for skin management as he presented with 2nd blister on medial proximal knee.     Time  8    Period  Weeks    Status  On-going      PT LONG TERM GOAL #2   Title  Patient tolerates prosthesis wear >90% of awake hours without skin issues or limb pain to enable function throughout his day.     Baseline  Progressing 10/03/2017 Patient is wearing prosthesis 7hrs 2x/day which is ~75% of awake hours. He developed 2nd blister week of 09/30/17.     Time  8    Period  Weeks    Status  On-going      PT LONG TERM GOAL #3   Title  Berg Balance >/= 40/56 to indicate lower fall risk.     Baseline  Progressed to 34/56 with initial LTG 36/56 on 09/30/2017    Time  8    Period  Weeks    Status  Revised      PT LONG TERM GOAL #4   Title  Patient ambulates around furniture with prosthesis only with assist for vision (reports independent in his apt).     Baseline  NOT MET 09/30/2017  patient requires minA for gait with prosthesis only.     Time  8    Period  Weeks    Status  On-going      PT LONG TERM GOAL #5   Title  Patient ambulates 500' with straight cane, Blind cane & prosthesis with supervision for vision without balance loss for community mobility.     Baseline  MET  11/07/2017 Patient ambulates 750' with cane & prosthesis with supervision    Time  8    Period  Weeks    Status  On-going      PT LONG TERM GOAL #6   Title  Patient negotiates stairs with 1 rail, ramps & curbs with cane, Blind cane & prosthesis with supervision for vision for community access.     Baseline  NOT MET 09/30/2017 MinA for ramp & curb negotiation with cane    Time  8    Period  Weeks    Status  On-going      PT LONG TERM GOAL #7   Title  Patient demonstrates work simulation tasks including lifting and reaching with prosthesis safely.     Time  8    Period  Weeks    Status  On-going      PT LONG TERM GOAL #8   Title  Patient reports limb pain </= 2/10 with standing activities.  Baseline  MET 10/03/2017    Time  8    Period  Weeks    Status  Achieved            Plan - 11/07/17 1202    Clinical Impression Statement  Pt continues to progress ambulation distance with SPC. Pt continues to require mild Verbal cueing for bending the knee during gait and  correct sequencing stepping over objects and stepping off curbs    Rehab Potential  Good    Clinical Impairments Affecting Rehab Potential  right TTA, DM1, neuropathy, acute renal failure, COPD, glaucoma, legally blind, HTN,     PT Frequency  2x / week    PT Duration  8 weeks    PT Treatment/Interventions  ADLs/Self Care Home Management;DME Instruction;Gait training;Stair training;Functional mobility training;Therapeutic activities;Therapeutic exercise;Balance training;Neuromuscular re-education;Patient/family education;Prosthetic Training    PT Next Visit Plan  Continue to work on improveing gait( with  and without SPC), lifting objects similar to work requirements, as well as ramp/curb/stairs in order to Meet LTGs    Consulted and Agree with Plan of Care  Patient       Patient will benefit from skilled therapeutic intervention in order to improve the following deficits and impairments:  Abnormal gait, Decreased  activity tolerance, Decreased balance, Decreased endurance, Decreased knowledge of use of DME, Decreased mobility, Decreased range of motion, Decreased strength, Impaired flexibility, Impaired vision/preception, Postural dysfunction, Prosthetic Dependency, Pain  Visit Diagnosis: Muscle weakness (generalized)  Other abnormalities of gait and mobility  Unsteadiness on feet  Stiffness of right knee, not elsewhere classified  Repeated falls  Pain in right leg     Problem List Patient Active Problem List   Diagnosis Date Noted  . Acquired absence of right leg below knee ([facility]) 03/13/2017  . Diabetic polyneuropathy associated with type 1 diabetes mellitus ([facility])   . Diabetes mellitus, new onset ([facility])   . Acute renal failure ([facility])   . Acute kidney injury ([facility]) 02/27/2017  . Hyperglycemia 02/27/2017  . Leukocytosis 02/27/2017    Waunita Schooner  SPT 11/07/2017, 12:59 PM  [facility] [address] [place], [place], [address] Phone: [phone]   Fax:  [phone]  Name: Akil Hoos MRN: [id number] Date of Birth: 1950-12-24

## 2017-11-12 ENCOUNTER — Ambulatory Visit: Payer: BC Managed Care – PPO | Attending: Orthopedic Surgery | Admitting: Physical Therapy

## 2017-11-12 ENCOUNTER — Encounter: Payer: Self-pay | Admitting: Physical Therapy

## 2017-11-12 DIAGNOSIS — R2681 Unsteadiness on feet: Secondary | ICD-10-CM | POA: Insufficient documentation

## 2017-11-12 DIAGNOSIS — M25661 Stiffness of right knee, not elsewhere classified: Secondary | ICD-10-CM | POA: Insufficient documentation

## 2017-11-12 DIAGNOSIS — M6281 Muscle weakness (generalized): Secondary | ICD-10-CM | POA: Insufficient documentation

## 2017-11-12 DIAGNOSIS — R2689 Other abnormalities of gait and mobility: Secondary | ICD-10-CM

## 2017-11-12 DIAGNOSIS — R296 Repeated falls: Secondary | ICD-10-CM | POA: Diagnosis not present

## 2017-11-12 NOTE — Therapy (Signed)
[name] [address] [place], [place], [address] Phone: [phone]   Fax:  [phone]  Physical Therapy Treatment  Patient Details  Name: Samuel Roth MRN: [id number] Date of Birth: 04-28-1951 Referring Provider: Meridee Score, MD   Encounter Date: 11/12/2017  PT End of Session - 11/12/17 1108    Visit Number  24    Number of Visits  31    Date for PT Re-Evaluation  11/29/17    Authorization Type  BCBS state primary & Medicare 2nd PT sent recert 62/70    Authorization Time Period  $1250 deductable met; 0 visit limit, no auth    PT Start Time  1101    PT Stop Time  1142    PT Time Calculation (min)  41 min    Equipment Utilized During Treatment  Gait belt    Activity Tolerance  Patient tolerated treatment well    Behavior During Therapy  WFL for tasks assessed/performed       Past Medical History:  Diagnosis Date  . Glaucoma   . Legally blind     Past Surgical History:  Procedure Laterality Date  . AMPUTATION Right 03/01/2017   Procedure: RIGHT TRANS METATARSAL AMPUTATION;  Surgeon: Newt Minion, MD;  Location: [facility];  Service: Orthopedics;  Laterality: Right;  . AMPUTATION Right 03/03/2017   Procedure: RIGHT BKA;  Surgeon: Newt Minion, MD;  Location: [facility];  Service: Orthopedics;  Laterality: Right;  . EYE SURGERY    . TIBIA FRACTURE SURGERY      There were no vitals filed for this visit.  Subjective Assessment - 11/12/17 1105    Subjective  No new complaints. No falls or pain to report.     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  No/denies    Pain Score  0-No pain           [facility] Adult PT  Treatment/Exercise - 11/12/17 1110      Transfers   Transfers  Sit to Stand;Stand to Sit    Sit to Stand  6: Modified independent (Device/Increase time)    Stand to Sit  6: Modified independent (Device/Increase time)      Ambulation/Gait   Ambulation/Gait  Yes    Ambulation/Gait Assistance  4: Min assist;4: Min guard;5: Supervision    Ambulation/Gait Assistance Details  cues on posture, weight shifting and for increased right knee flexion with swing phase    Ambulation Distance (Feet)  350 Feet x1 in/outdoor, 250 x1, plus around gym    Assistive device  Straight cane    Gait Pattern  Step-through pattern;Decreased step length - left;Decreased stance time - right;Decreased stride length    Ambulation Surface  Level;Unlevel;Indoor;Outdoor;Paved;Grass;Gravel      High Level Balance   High Level Balance Activities  Side stepping;Backward walking;Marching forwards;Negotiating over obstacles    High Level Balance Comments  in parallel bars: min guard assist for 3-4 laps each; outside of bars with cane support ffwd stepping over bolsters of varied heights x 4 laps with cues on sequencing, use of cane to assess height of object and up to min assist for balance.  Therapeutic Activites    Therapeutic Activities  Lifting    Lifting  lifting 12-pack sodas with 12.5# weight wrapped around it: floor to low shelf, low shelf to middle shelf, middle shelf to counter top, back down. min guar assist with cues on posture and correct technique/body mechanics.      Prosthetics   Current prosthetic wear tolerance (days/week)   daily    Current prosthetic wear tolerance (#hours/day)   reports all awake hours except ~1hr midday & 1-2 hrs prior to bed    Residual limb condition   intact with no issues noted    Donning Prosthesis  Supervision    Doffing Prosthesis  Modified independent (device/increased time)            PT Short Term Goals - 10/29/17 2205      PT SHORT TERM GOAL #1   Title   Patient verbalizes proper care of residual limb including adjusting ply socks & sweat management.  (All STGs Target Date 11/01/17)    Baseline  MET 10/29/2017    Time  4    Period  Weeks    Status  Achieved      PT SHORT TERM GOAL #2   Title  Patient tolerates wear of prosthesis >15 hrs total /day without skin issues.     Baseline  Partially MET 10/29/2017 on work days or days he is out of apt. otherwise he reports spending time in bed which limits total wear time.     Time  4    Period  Weeks    Status  Partially Met      PT SHORT TERM GOAL #3   Title  Patient ambulates 300' with straight cane, Blind cane & prosthesis outdoors including ramps & curbs with supervision.     Baseline  MET 10/29/2017    Time  4    Period  Weeks    Status  Achieved      PT SHORT TERM GOAL #4   Title  Patient ambulates 20' with prosthesis only with min guard.     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    Baseline  MET 10/22/2017    Time  4    Period  Weeks    Status  Achieved        PT Long Term Goals - 11/07/17 1217      PT LONG TERM GOAL #1   Title  Patient demonstrates & verbalizes proper prosthetic care to enable safe prosthesis use. (All LTGs Target Date 11/29/17)    Baseline  Progressing 10/03/2017 Patient requires cues for skin management as he presented with 2nd blister on medial proximal knee.     Time  8    Period  Weeks    Status  On-going      PT LONG TERM GOAL #2   Title  Patient tolerates prosthesis wear >90% of awake hours without skin issues or limb pain to enable function throughout his day.     Baseline  Progressing 10/03/2017 Patient is wearing prosthesis 7hrs 2x/day which is ~75% of awake hours. He developed 2nd blister week of 09/30/17.     Time  8    Period  Weeks    Status  On-going  PT LONG TERM GOAL #3   Title  Berg Balance >/=  40/56 to indicate lower fall risk.     Baseline  Progressed to 34/56 with initial LTG 36/56 on 09/30/2017    Time  8    Period  Weeks    Status  Revised      PT LONG TERM GOAL #4   Title  Patient ambulates around furniture with prosthesis only with assist for vision (reports independent in his apt).     Baseline  NOT MET 09/30/2017  patient requires minA for gait with prosthesis only.     Time  8    Period  Weeks    Status  On-going      PT LONG TERM GOAL #5   Title  Patient ambulates 500' with straight cane, Blind cane & prosthesis with supervision for vision without balance loss for community mobility.     Baseline  MET 11/07/2017 Patient ambulates 750' with cane & prosthesis with supervision    Time  8    Period  Weeks    Status  On-going      PT LONG TERM GOAL #6   Title  Patient negotiates stairs with 1 rail, ramps & curbs with cane, Blind cane & prosthesis with supervision for vision for community access.     Baseline  NOT MET 09/30/2017 MinA for ramp & curb negotiation with cane    Time  8    Period  Weeks    Status  On-going      PT LONG TERM GOAL #7   Title  Patient demonstrates work simulation tasks including lifting and reaching with prosthesis safely.     Time  8    Period  Weeks    Status  On-going      PT LONG TERM GOAL #8   Title  Patient reports limb pain </= 2/10 with standing activities.     Baseline  MET 10/03/2017    Time  8    Period  Weeks    Status  Achieved            Plan - 11/12/17 1108    Clinical Impression Statement  Today's skilled session continued to address gait with cane on various surfaces, balance and work simulation tasks without any issues. Pt continues to need cues on correct sequencing with negotiating over obstacles, needs continued work on this. Pt is progressing toward goals and should benefit from continued PT to progress toward unmet goals.    Rehab Potential  Good    Clinical Impairments Affecting Rehab Potential  right  TTA, DM1, neuropathy, acute renal failure, COPD, glaucoma, legally blind, HTN,     PT Frequency  2x / week    PT Duration  8 weeks    PT Treatment/Interventions  ADLs/Self Care Home Management;DME Instruction;Gait training;Stair training;Functional mobility training;Therapeutic activities;Therapeutic exercise;Balance training;Neuromuscular re-education;Patient/family education;Prosthetic Training    PT Next Visit Plan  Continue to work on gait ( with and without SPC), lifting objects similar to work requirements, as well as ramp/curb/stairs in order to Meet LTGs    Consulted and Agree with Plan of Care  Patient       Patient will benefit from skilled therapeutic intervention in order to improve the following deficits and impairments:  Abnormal gait, Decreased activity tolerance, Decreased balance, Decreased endurance, Decreased knowledge of use of DME, Decreased mobility, Decreased range of motion, Decreased strength, Impaired flexibility, Impaired vision/preception, Postural dysfunction, Prosthetic Dependency, Pain  Visit Diagnosis: Muscle  weakness (generalized)  Other abnormalities of gait and mobility  Unsteadiness on feet     Problem List Patient Active Problem List   Diagnosis Date Noted  . Acquired absence of right leg below knee ([facility]) 03/13/2017  . Diabetic polyneuropathy associated with type 1 diabetes mellitus ([facility])   . Diabetes mellitus, new onset ([facility])   . Acute renal failure ([facility])   . Acute kidney injury ([facility]) 02/27/2017  . Hyperglycemia 02/27/2017  . Leukocytosis 02/27/2017    Willow Ora, PTA, [facility] Outpatient Neuro [facility] [address], [place], [place] [address] [phone] 11/12/17, 10:32 PM   Name: Shivan Hodes MRN: [id number] Date of Birth: [birth year]

## 2017-11-14 ENCOUNTER — Ambulatory Visit: Payer: BC Managed Care – PPO | Admitting: Physical Therapy

## 2017-11-14 ENCOUNTER — Encounter: Payer: Self-pay | Admitting: Physical Therapy

## 2017-11-14 DIAGNOSIS — M6281 Muscle weakness (generalized): Secondary | ICD-10-CM | POA: Diagnosis not present

## 2017-11-14 DIAGNOSIS — M25661 Stiffness of right knee, not elsewhere classified: Secondary | ICD-10-CM | POA: Diagnosis not present

## 2017-11-14 DIAGNOSIS — R2681 Unsteadiness on feet: Secondary | ICD-10-CM | POA: Diagnosis not present

## 2017-11-14 DIAGNOSIS — R2689 Other abnormalities of gait and mobility: Secondary | ICD-10-CM

## 2017-11-14 DIAGNOSIS — R296 Repeated falls: Secondary | ICD-10-CM | POA: Diagnosis not present

## 2017-11-15 NOTE — Therapy (Signed)
[facility] [address] [place], [place], [address] Phone: [phone]   Fax:  [phone]  Physical Therapy Treatment  Patient Details  Name: Samuel Roth MRN: [id number] Date of Birth: [birth year] Referring Provider: Meridee Score, MD   Encounter Date: 11/14/2017  PT End of Session - 11/14/17 1537    Visit Number  25    Number of Visits  31    Date for PT Re-Evaluation  11/29/17    Authorization Type  BCBS state primary & Medicare 2nd PT sent recert 70/62    Authorization Time Period  $1250 deductable met; 0 visit limit, no auth    PT Start Time  1533    PT Stop Time  1613    PT Time Calculation (min)  40 min    Equipment Utilized During Treatment  Gait belt    Activity Tolerance  Patient tolerated treatment well    Behavior During Therapy  [facility] for tasks assessed/performed       Past Medical History:  Diagnosis Date  . Glaucoma   . Legally blind     Past Surgical History:  Procedure Laterality Date  . AMPUTATION Right 03/01/2017   Procedure: RIGHT TRANS METATARSAL AMPUTATION;  Surgeon: Newt Minion, MD;  Location: [facility];  Service: Orthopedics;  Laterality: Right;  . AMPUTATION Right 03/03/2017   Procedure: RIGHT BKA;  Surgeon: Newt Minion, MD;  Location: [facility];  Service: Orthopedics;  Laterality: Right;  . EYE SURGERY    . TIBIA FRACTURE SURGERY      There were no vitals filed for this visit.  Subjective Assessment - 11/14/17 1536    Subjective  No new complaints. No falls or pain to report.     Patient is accompained by:  Family member spouse in lobby    Pertinent History  right TTA, DM1, neuropathy, acute renal failure, COPD, glaucoma, legally blind since birth, HTN,     Limitations  Lifting;Standing;Walking;House hold activities    Currently in Pain?  No/denies    Pain Score  0-No pain           [facility] Adult PT Treatment/Exercise - 11/14/17 1538      Transfers   Transfers  Sit to Stand;Stand  to Sit    Sit to Stand  6: Modified independent (Device/Increase time)    Stand to Sit  6: Modified independent (Device/Increase time)      Ambulation/Gait   Ambulation/Gait  Yes    Ambulation/Gait Assistance  4: Min guard;5: Supervision    Ambulation/Gait Assistance Details  continued cues on posture, step length and weight shifting needed    Ambulation Distance (Feet)  450 Feet x1    Assistive device  Straight cane    Gait Pattern  Step-through pattern;Decreased step length - left;Decreased stance time - right;Decreased stride length    Ambulation Surface  Level;Indoor    Stairs  Yes    Stairs Assistance  4: Min guard    Stairs Assistance Details (indicate cue type and reason)  cues for use    Stair Management Technique  One rail Right;Step to pattern;Forwards    Number of Stairs  4 x2 reps      High Level Balance   High Level Balance Activities  Negotiating over obstacles    High Level Balance Comments  with cane: fwd stepping over bolsters of varied heights x 4 laps with min guard to min assist for balance, cues on sequencing; figure 8 around stools as  objects with cane x 4 laps with cues on step length and sequencing. min guard assist for balance.                                 Prosthetics   Current prosthetic wear tolerance (days/week)   daily    Current prosthetic wear tolerance (#hours/day)   reports all awake hours except ~1hr midday & 1-2 hrs prior to bed    Residual limb condition   pt reports intact without any issues    Education Provided  Residual limb care    Person(s) Educated  Patient    Education Method  Explanation;Demonstration;Verbal cues    Education Method  Returned demonstration;Verbalized understanding;Verbal cues required;Needs further instruction    Donning Prosthesis  Supervision    Doffing Prosthesis  Modified independent (device/increased time)          Balance Exercises - 11/14/17 1603      Balance Exercises: Standing   Rockerboard   Anterior/posterior;EO;EC;30 seconds;10 reps;Intermittent UE support      Balance Exercises: Standing   Rebounder Limitations  on balance board with intermittent UE touch to bars for balance: EO rocking board with emphasis on posture/weight shifitng; holding board steady- EO alternating UE raises, progressing to bil UE raises, progressing to EC no head movements. min guard to min assist for balance needed.           PT Short Term Goals - 10/29/17 2205      PT SHORT TERM GOAL #1   Title  Patient verbalizes proper care of residual limb including adjusting ply socks & sweat management.  (All STGs Target Date 11/01/17)    Baseline  MET 10/29/2017    Time  4    Period  Weeks    Status  Achieved      PT SHORT TERM GOAL #2   Title  Patient tolerates wear of prosthesis >15 hrs total /day without skin issues.     Baseline  Partially MET 10/29/2017 on work days or days he is out of apt. otherwise he reports spending time in bed which limits total wear time.     Time  4    Period  Weeks    Status  Partially Met      PT SHORT TERM GOAL #3   Title  Patient ambulates 300' with straight cane, Blind cane & prosthesis outdoors including ramps & curbs with supervision.     Baseline  MET 10/29/2017    Time  4    Period  Weeks    Status  Achieved      PT SHORT TERM GOAL #4   Title  Patient ambulates 68' with prosthesis only with min guard.     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    Baseline  MET 10/22/2017    Time  4    Period  Weeks    Status  Achieved        PT Long Term Goals - 11/07/17 1217      PT LONG TERM GOAL #1   Title  Patient demonstrates & verbalizes proper prosthetic care to enable safe prosthesis use. (All LTGs Target Date 11/29/17)    Baseline  Progressing 10/03/2017 Patient requires cues for skin management as he presented with 2nd  blister on medial proximal knee.     Time  8    Period  Weeks    Status  On-going      PT LONG TERM GOAL #2   Title  Patient tolerates prosthesis wear >90% of awake hours without skin issues or limb pain to enable function throughout his day.     Baseline  Progressing 10/03/2017 Patient is wearing prosthesis 7hrs 2x/day which is ~75% of awake hours. He developed 2nd blister week of 09/30/17.     Time  8    Period  Weeks    Status  On-going      PT LONG TERM GOAL #3   Title  Berg Balance >/= 40/56 to indicate lower fall risk.     Baseline  Progressed to 34/56 with initial LTG 36/56 on 09/30/2017    Time  8    Period  Weeks    Status  Revised      PT LONG TERM GOAL #4   Title  Patient ambulates around furniture with prosthesis only with assist for vision (reports independent in his apt).     Baseline  NOT MET 09/30/2017  patient requires minA for gait with prosthesis only.     Time  8    Period  Weeks    Status  On-going      PT LONG TERM GOAL #5   Title  Patient ambulates 500' with straight cane, Blind cane & prosthesis with supervision for vision without balance loss for community mobility.     Baseline  MET 11/07/2017 Patient ambulates 750' with cane & prosthesis with supervision    Time  8    Period  Weeks    Status  On-going      PT LONG TERM GOAL #6   Title  Patient negotiates stairs with 1 rail, ramps & curbs with cane, Blind cane & prosthesis with supervision for vision for community access.     Baseline  NOT MET 09/30/2017 MinA for ramp & curb negotiation with cane    Time  8    Period  Weeks    Status  On-going      PT LONG TERM GOAL #7   Title  Patient demonstrates work simulation tasks including lifting and reaching with prosthesis safely.     Time  8    Period  Weeks    Status  On-going      PT LONG TERM GOAL #8   Title  Patient reports limb pain </= 2/10 with standing activities.     Baseline  MET 10/03/2017    Time  8    Period  Weeks    Status  Achieved             Plan - 11/14/17 1537    Clinical Impression Statement  Today's skilled session continued to address mobility with cane/prosthesis and balance activities without any issues reported. Pt is making steady progress toward goals and should benefit from continued PT to progress toward unmet goals.     Rehab Potential  Good    Clinical Impairments Affecting Rehab Potential  right TTA, DM1, neuropathy, acute renal failure, COPD, glaucoma, legally blind, HTN,     PT Frequency  2x / week    PT Duration  8 weeks    PT Treatment/Interventions  ADLs/Self Care Home Management;DME Instruction;Gait training;Stair training;Functional mobility training;Therapeutic activities;Therapeutic exercise;Balance training;Neuromuscular re-education;Patient/family education;Prosthetic Training  PT Next Visit Plan  Continue to work on gait ( with and without SPC), lifting objects similar to work requirements, as well as ramp/curb/stairs in order to Meet LTGs (due 11/29/17)    Consulted and Agree with Plan of Care  Patient       Patient will benefit from skilled therapeutic intervention in order to improve the following deficits and impairments:  Abnormal gait, Decreased activity tolerance, Decreased balance, Decreased endurance, Decreased knowledge of use of DME, Decreased mobility, Decreased range of motion, Decreased strength, Impaired flexibility, Impaired vision/preception, Postural dysfunction, Prosthetic Dependency, Pain  Visit Diagnosis: Muscle weakness (generalized)  Other abnormalities of gait and mobility  Unsteadiness on feet     Problem List Patient Active Problem List   Diagnosis Date Noted  . Acquired absence of right leg below knee ([place]) 03/13/2017  . Diabetic polyneuropathy associated with type 1 diabetes mellitus ([place])   . Diabetes mellitus, new onset ([place])   . Acute renal failure ([place])   . Acute kidney injury ([place]) 02/27/2017  . Hyperglycemia 02/27/2017  . Leukocytosis  02/27/2017    Willow Ora, PTA, [facility] [address] [place], [place] [address] [phone] 11/15/17, 12:55 PM   Name: Samuel Roth MRN: [id number] Date of Birth: [birth year]

## 2017-11-19 ENCOUNTER — Encounter: Payer: Self-pay | Admitting: Physical Therapy

## 2017-11-19 ENCOUNTER — Ambulatory Visit: Payer: BC Managed Care – PPO | Admitting: Physical Therapy

## 2017-11-19 DIAGNOSIS — M6281 Muscle weakness (generalized): Secondary | ICD-10-CM

## 2017-11-19 DIAGNOSIS — R2681 Unsteadiness on feet: Secondary | ICD-10-CM

## 2017-11-19 DIAGNOSIS — R296 Repeated falls: Secondary | ICD-10-CM | POA: Diagnosis not present

## 2017-11-19 DIAGNOSIS — R2689 Other abnormalities of gait and mobility: Secondary | ICD-10-CM | POA: Diagnosis not present

## 2017-11-19 DIAGNOSIS — M25661 Stiffness of right knee, not elsewhere classified: Secondary | ICD-10-CM | POA: Diagnosis not present

## 2017-11-20 NOTE — Therapy (Signed)
[facility] [address] [place], [place], [address] Phone: [phone]   Fax:  [phone]  Physical Therapy Treatment  Patient Details  Name: Samuel Roth MRN: [id number] Date of Birth: [birth year] Referring Provider: Meridee Score, MD   Encounter Date: 11/19/2017  PT End of Session - 11/19/17 1214    Visit Number  26    Number of Visits  31    Date for PT Re-Evaluation  11/29/17    Authorization Type  BCBS state primary & Medicare 2nd PT sent recert 50/53    Authorization Time Period  $1250 deductable met; 0 visit limit, no auth    PT Start Time  1100    PT Stop Time  1144    PT Time Calculation (min)  44 min    Equipment Utilized During Treatment  Gait belt    Activity Tolerance  Patient tolerated treatment well    Behavior During Therapy  WFL for tasks assessed/performed       Past Medical History:  Diagnosis Date  . Glaucoma   . Legally blind     Past Surgical History:  Procedure Laterality Date  . AMPUTATION Right 03/01/2017   Procedure: RIGHT TRANS METATARSAL AMPUTATION;  Surgeon: Newt Minion, MD;  Location: [facility];  Service: Orthopedics;  Laterality: Right;  . AMPUTATION Right 03/03/2017   Procedure: RIGHT BKA;  Surgeon: Newt Minion, MD;  Location: [facility];  Service: Orthopedics;  Laterality: Right;  . EYE SURGERY    . TIBIA FRACTURE SURGERY      There were no vitals filed for this visit.  Subjective Assessment - 11/19/17 1100    Subjective  He is wearing prosthesis most of awake hours without issues. No falls.     Patient is accompained by:  Family member spouse in lobby    Pertinent History  right TTA, DM1, neuropathy, acute renal failure, COPD, glaucoma, legally blind since birth, HTN,     Limitations  Lifting;Standing;Walking;House hold activities    Currently in Pain?  No/denies                      [facility] Adult PT Treatment/Exercise - 11/19/17 1100      Transfers   Transfers   Sit to Stand;Stand to Sit    Sit to Stand  6: Modified independent (Device/Increase time);With upper extremity assist;From chair/3-in-1    Stand to Sit  6: Modified independent (Device/Increase time);With upper extremity assist;To chair/3-in-1    Floor to Transfer  6: Modified independent (Device/Increase time);With upper extremity assist pushing on mat table      Ambulation/Gait   Ambulation/Gait  Yes    Ambulation/Gait Assistance  5: Supervision    Ambulation/Gait Assistance Details  household gait negotiating around furniture, chairs, etc. with prosthesis only & progressed to carrying both cup of water in RUE & plate of food in LUE.  Outdoor simulated (raining) with cane including on compliant surfaces.     Ambulation Distance (Feet)  600 Feet 600' with cane & 100' X 3 without device except prosthesis    Assistive device  Straight cane;None;Prosthesis    Gait Pattern  Step-through pattern;Decreased step length - left;Decreased stance time - right;Decreased stride length    Ambulation Surface  Indoor;Level;Other (comment) compliant    Stairs  Yes    Stairs Assistance  5: Supervision cane & prosthesis    Stair Management Technique  One rail Right;Step to pattern;Forwards    Number of Stairs  4 x2 reps    Ramp  5: Supervision cane & prosthesis    Curb  5: Supervision cane & prosthesis      High Level Balance   High Level Balance Activities  Negotiating over obstacles;Side stepping;Backward walking;Negotitating around obstacles;Head turns    High Level Balance Comments  with cane: fwd stepping over bolsters of varied heights x 4 laps with min guard to min assist for balance, cues on sequencing; figure 8 around stools as objects with cane x 4 laps with cues on step length and sequencing. min guard assist for balance.                                 Therapeutic Activites    Therapeutic Activities  Work Web designer  picking up crates with 10# & 15# with cues on foot placement &  mechanics    Work Simulation  placing light boxes in upper Financial risk analyst (lower eye level & middle just over head level) with verbal cues on foot position for balance.       Prosthetics   Current prosthetic wear tolerance (days/week)   daily    Current prosthetic wear tolerance (#hours/day)   reports all awake hours    Residual limb condition   intact    Education Provided  Residual limb care               PT Short Term Goals - 10/29/17 2205      PT SHORT TERM GOAL #1   Title  Patient verbalizes proper care of residual limb including adjusting ply socks & sweat management.  (All STGs Target Date 11/01/17)    Baseline  MET 10/29/2017    Time  4    Period  Weeks    Status  Achieved      PT SHORT TERM GOAL #2   Title  Patient tolerates wear of prosthesis >15 hrs total /day without skin issues.     Baseline  Partially MET 10/29/2017 on work days or days he is out of apt. otherwise he reports spending time in bed which limits total wear time.     Time  4    Period  Weeks    Status  Partially Met      PT SHORT TERM GOAL #3   Title  Patient ambulates 300' with straight cane, Blind cane & prosthesis outdoors including ramps & curbs with supervision.     Baseline  MET 10/29/2017    Time  4    Period  Weeks    Status  Achieved      PT SHORT TERM GOAL #4   Title  Patient ambulates 28' with prosthesis only with min guard.     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    Baseline  MET 10/22/2017    Time  4    Period  Weeks    Status  Achieved        PT Long Term Goals - 11/07/17 1217      PT LONG TERM GOAL #1   Title  Patient demonstrates & verbalizes proper prosthetic care to enable safe prosthesis use. (All LTGs Target Date 11/29/17)  Baseline  Progressing 10/03/2017 Patient requires cues for skin management as he presented with 2nd  blister on medial proximal knee.     Time  8    Period  Weeks    Status  On-going      PT LONG TERM GOAL #2   Title  Patient tolerates prosthesis wear >90% of awake hours without skin issues or limb pain to enable function throughout his day.     Baseline  Progressing 10/03/2017 Patient is wearing prosthesis 7hrs 2x/day which is ~75% of awake hours. He developed 2nd blister week of 09/30/17.     Time  8    Period  Weeks    Status  On-going      PT LONG TERM GOAL #3   Title  Berg Balance >/= 40/56 to indicate lower fall risk.     Baseline  Progressed to 34/56 with initial LTG 36/56 on 09/30/2017    Time  8    Period  Weeks    Status  Revised      PT LONG TERM GOAL #4   Title  Patient ambulates around furniture with prosthesis only with assist for vision (reports independent in his apt).     Baseline  NOT MET 09/30/2017  patient requires minA for gait with prosthesis only.     Time  8    Period  Weeks    Status  On-going      PT LONG TERM GOAL #5   Title  Patient ambulates 500' with straight cane, Blind cane & prosthesis with supervision for vision without balance loss for community mobility.     Baseline  MET 11/07/2017 Patient ambulates 750' with cane & prosthesis with supervision    Time  8    Period  Weeks    Status  On-going      PT LONG TERM GOAL #6   Title  Patient negotiates stairs with 1 rail, ramps & curbs with cane, Blind cane & prosthesis with supervision for vision for community access.     Baseline  NOT MET 09/30/2017 MinA for ramp & curb negotiation with cane    Time  8    Period  Weeks    Status  On-going      PT LONG TERM GOAL #7   Title  Patient demonstrates work simulation tasks including lifting and reaching with prosthesis safely.     Time  8    Period  Weeks    Status  On-going      PT LONG TERM GOAL #8   Title  Patient reports limb pain </= 2/10 with standing activities.     Baseline  MET 10/03/2017    Time  8    Period  Weeks    Status  Achieved             Plan - 11/19/17 1215    Clinical Impression Statement  Patient appears on target to meet LTGs next week with function in home & office with prosthesis only including carrying light items and community with support cane unless low vision (his vision "bad day" or dark environment) then add sight cane along with support cane. He improved lifting with instructions in proper technique including placing light box on upper shelves.  Rehab Potential  Good    Clinical Impairments Affecting Rehab Potential  right TTA, DM1, neuropathy, acute renal failure, COPD, glaucoma, legally blind, HTN,     PT Frequency  2x / week    PT Duration  8 weeks    PT Treatment/Interventions  ADLs/Self Care Home Management;DME Instruction;Gait training;Stair training;Functional mobility training;Therapeutic activities;Therapeutic exercise;Balance training;Neuromuscular re-education;Patient/family education;Prosthetic Training    PT Next Visit Plan  Continue to work on gait ( with and without SPC), lifting objects similar to work requirements, as well as ramp/curb/stairs in order to Meet LTGs (due 11/29/17)    Consulted and Agree with Plan of Care  Patient       Patient will benefit from skilled therapeutic intervention in order to improve the following deficits and impairments:  Abnormal gait, Decreased activity tolerance, Decreased balance, Decreased endurance, Decreased knowledge of use of DME, Decreased mobility, Decreased range of motion, Decreased strength, Impaired flexibility, Impaired vision/preception, Postural dysfunction, Prosthetic Dependency, Pain  Visit Diagnosis: Muscle weakness (generalized)  Other abnormalities of gait and mobility  Unsteadiness on feet     Problem  List Patient Active Problem List   Diagnosis Date Noted  . Acquired absence of right leg below knee ([place]) 03/13/2017  . Diabetic polyneuropathy associated with type 1 diabetes mellitus ([place])   . Diabetes mellitus, new onset ([place])   . Acute renal failure ([place])   . Acute kidney injury ([place]) 02/27/2017  . Hyperglycemia 02/27/2017  . Leukocytosis 02/27/2017    Samuel Roth PT, DPT 11/20/2017, 11:24 AM  [facility] [address] [place], [place], [address] Phone: [phone]   Fax:  [phone]  Name: Samuel Roth MRN: [id number] Date of Birth: Nov 24, 1950

## 2017-11-21 ENCOUNTER — Encounter: Payer: Self-pay | Admitting: Physical Therapy

## 2017-11-21 ENCOUNTER — Ambulatory Visit: Payer: BC Managed Care – PPO | Admitting: Physical Therapy

## 2017-11-21 DIAGNOSIS — M25661 Stiffness of right knee, not elsewhere classified: Secondary | ICD-10-CM

## 2017-11-21 DIAGNOSIS — M6281 Muscle weakness (generalized): Secondary | ICD-10-CM | POA: Diagnosis not present

## 2017-11-21 DIAGNOSIS — R2681 Unsteadiness on feet: Secondary | ICD-10-CM

## 2017-11-21 DIAGNOSIS — R296 Repeated falls: Secondary | ICD-10-CM | POA: Diagnosis not present

## 2017-11-21 DIAGNOSIS — R2689 Other abnormalities of gait and mobility: Secondary | ICD-10-CM

## 2017-11-22 NOTE — Therapy (Signed)
[facility] [address] [place], [place], [address] Phone: [phone]   Fax:  [phone]  Physical Therapy Treatment  Patient Details  Name: Samuel Roth MRN: [id number] Date of Birth: June 28, 1951 Referring Provider: Meridee Score, MD   Encounter Date: 11/21/2017  PT End of Session - 11/21/17 1358    Visit Number  27    Number of Visits  31    Date for PT Re-Evaluation  11/29/17    Authorization Type  BCBS state primary & Medicare 2nd PT sent recert 03/70    Authorization Time Period  $1250 deductable met; 0 visit limit, no auth    PT Start Time  1103    PT Stop Time  1145    PT Time Calculation (min)  42 min    Equipment Utilized During Treatment  Gait belt    Activity Tolerance  Patient tolerated treatment well    Behavior During Therapy  WFL for tasks assessed/performed       Past Medical History:  Diagnosis Date  . Glaucoma   . Legally blind     Past Surgical History:  Procedure Laterality Date  . AMPUTATION Right 03/01/2017   Procedure: RIGHT TRANS METATARSAL AMPUTATION;  Surgeon: Newt Minion, MD;  Location: [facility];  Service: Orthopedics;  Laterality: Right;  . AMPUTATION Right 03/03/2017   Procedure: RIGHT BKA;  Surgeon: Newt Minion, MD;  Location: [facility];  Service: Orthopedics;  Laterality: Right;  . EYE SURGERY    . TIBIA FRACTURE SURGERY      There were no vitals filed for this visit.  Subjective Assessment - 11/21/17 1059    Subjective  He still wearing prosthesis all awake hours without issues.     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  No/denies                      [facility]  PT Treatment/Exercise - 11/21/17 1100      Transfers   Transfers  Sit to Stand;Stand to Sit    Sit to Stand  6: Modified independent (Device/Increase time);With upper extremity assist;From chair/3-in-1    Stand to Sit  6: Modified independent (Device/Increase time);With upper extremity assist;To chair/3-in-1    Floor to Transfer  6: Modified independent (Device/Increase time);With upper extremity assist pushing on mat table      Ambulation/Gait   Ambulation/Gait  Yes    Ambulation/Gait Assistance  5: Supervision    Ambulation/Gait Assistance Details  household gait with prosthesis only including carrying light weight items like cup of water in one hand & plate in other walking around furniture.  Community based gait with single point support cane including outdoors. PT recommendation to add vision cane when "bad vision days" or dark.     Ambulation Distance (Feet)  800 Feet 800' outdoors & 100' X 3 indoors    Assistive device  Straight cane;None;Prosthesis    Gait Pattern  Step-through pattern;Decreased step length - left;Decreased stance time - right;Decreased stride length    Stairs  Yes    Stairs Assistance  5: Supervision cane & prosthesis    Stair Management Technique  One rail Right;Step  to pattern;Forwards;With cane    Number of Stairs  4 x2 reps    Ramp  5: Supervision cane & prosthesis    Curb  5: Supervision cane & prosthesis      High Level Balance   High Level Balance Activities  Negotiating over obstacles;Side stepping;Backward walking;Negotitating around obstacles;Head turns    High Level Balance Comments  with cane: fwd stepping over bolsters of varied heights x 4 laps with min guard to min assist for balance, cues on sequencing; figure 8 around stools as objects with cane x 4 laps with cues on step length and sequencing. min guard assist for balance.                                 Therapeutic Activites    Therapeutic Activities  Work Simulation    Lifting  picking up  crates with 10# & 15# with cues on foot placement & mechanics. 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 Pt able to push cart around safely.       Prosthetics   Current prosthetic wear tolerance (days/week)   daily    Current prosthetic wear tolerance (#hours/day)   reports all awake hours    Residual limb condition   intact    Education Provided  Residual limb care               PT Short Term Goals - 10/29/17 2205      PT SHORT TERM GOAL #1   Title  Patient verbalizes proper care of residual limb including adjusting ply socks & sweat management.  (All STGs Target Date 11/01/17)    Baseline  MET 10/29/2017    Time  4    Period  Weeks    Status  Achieved      PT SHORT TERM GOAL #2   Title  Patient tolerates wear of prosthesis >15 hrs total /day without skin issues.     Baseline  Partially MET 10/29/2017 on work days or days he is out of apt. otherwise he reports spending time in bed which limits total wear time.     Time  4    Period  Weeks    Status  Partially Met      PT SHORT TERM GOAL #3   Title  Patient ambulates 300' with straight cane, Blind cane & prosthesis outdoors including ramps & curbs with supervision.     Baseline  MET 10/29/2017    Time  4    Period  Weeks    Status  Achieved      PT SHORT TERM GOAL #4   Title  Patient ambulates 102' with prosthesis only with min guard.     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    Baseline  MET 10/22/2017    Time  4    Period  Weeks    Status  Achieved        PT Long Term Goals - 11/07/17 1217      PT LONG TERM GOAL #1   Title  Patient demonstrates & verbalizes proper prosthetic care to enable safe prosthesis use. (All LTGs Target Date 11/29/17)  Baseline  Progressing 10/03/2017 Patient requires cues for skin management as he  presented with 2nd blister on medial proximal knee.     Time  8    Period  Weeks    Status  On-going      PT LONG TERM GOAL #2   Title  Patient tolerates prosthesis wear >90% of awake hours without skin issues or limb pain to enable function throughout his day.     Baseline  Progressing 10/03/2017 Patient is wearing prosthesis 7hrs 2x/day which is ~75% of awake hours. He developed 2nd blister week of 09/30/17.     Time  8    Period  Weeks    Status  On-going      PT LONG TERM GOAL #3   Title  Berg Balance >/= 40/56 to indicate lower fall risk.     Baseline  Progressed to 34/56 with initial LTG 36/56 on 09/30/2017    Time  8    Period  Weeks    Status  Revised      PT LONG TERM GOAL #4   Title  Patient ambulates around furniture with prosthesis only with assist for vision (reports independent in his apt).     Baseline  NOT MET 09/30/2017  patient requires minA for gait with prosthesis only.     Time  8    Period  Weeks    Status  On-going      PT LONG TERM GOAL #5   Title  Patient ambulates 500' with straight cane, Blind cane & prosthesis with supervision for vision without balance loss for community mobility.     Baseline  MET 11/07/2017 Patient ambulates 750' with cane & prosthesis with supervision    Time  8    Period  Weeks    Status  On-going      PT LONG TERM GOAL #6   Title  Patient negotiates stairs with 1 rail, ramps & curbs with cane, Blind cane & prosthesis with supervision for vision for community access.     Baseline  NOT MET 09/30/2017 MinA for ramp & curb negotiation with cane    Time  8    Period  Weeks    Status  On-going      PT LONG TERM GOAL #7   Title  Patient demonstrates work simulation tasks including lifting and reaching with prosthesis safely.     Time  8    Period  Weeks    Status  On-going      PT LONG TERM GOAL #8   Title  Patient reports limb pain </= 2/10 with standing activities.     Baseline  MET 10/03/2017    Time  8    Period  Weeks     Status  Achieved            Plan - 11/21/17 1358    Clinical Impression Statement  Patient is on target to meet LTGs & discharge next week. He improved ability to perform work simulation tasks with skilled instruction in [place].     Rehab Potential  Good    Clinical Impairments Affecting Rehab Potential  right TTA, DM1, neuropathy, acute renal failure, COPD, glaucoma, legally blind, HTN,     PT Frequency  2x / week    PT Duration  8 weeks    PT Treatment/Interventions  ADLs/Self Care Home Management;DME Instruction;Gait training;Stair training;Functional mobility training;Therapeutic activities;Therapeutic exercise;Balance training;Neuromuscular re-education;Patient/family education;Prosthetic Training    PT Next Visit Plan  assess LTGs over 2 visits, indoor gait with prosthesis only & outdoor gait with single point support cane.     Consulted and Agree with Plan of Care  Patient       Patient will benefit from skilled therapeutic intervention in order to improve the following deficits and impairments:  Abnormal gait, Decreased activity tolerance, Decreased balance, Decreased endurance, Decreased knowledge of use of DME, Decreased mobility, Decreased range of motion, Decreased strength, Impaired flexibility, Impaired vision/preception, Postural dysfunction, Prosthetic Dependency, Pain  Visit Diagnosis: Muscle weakness (generalized)  Other abnormalities of gait and mobility  Unsteadiness on feet  Stiffness of right knee, not elsewhere classified     Problem List Patient Active Problem List   Diagnosis Date Noted  . Acquired absence of right leg below knee ([facility]) 03/13/2017  . Diabetic polyneuropathy associated with type 1 diabetes mellitus ([facility])   . Diabetes mellitus, new onset ([facility])   . Acute renal failure ([facility])   . Acute kidney injury ([facility]) 02/27/2017  . Hyperglycemia 02/27/2017  . Leukocytosis 02/27/2017    Lougenia Morrissey PT, DPT 11/22/2017, 2:01 PM  [facility] [address] [place], [place], [address] Phone: [phone]   Fax:  [phone]  Name: Samuel Roth MRN: [id number] Date of Birth: Jan 15, 1951

## 2017-11-26 ENCOUNTER — Ambulatory Visit: Payer: BC Managed Care – PPO | Admitting: Physical Therapy

## 2017-11-26 DIAGNOSIS — R2689 Other abnormalities of gait and mobility: Secondary | ICD-10-CM | POA: Diagnosis not present

## 2017-11-26 DIAGNOSIS — M6281 Muscle weakness (generalized): Secondary | ICD-10-CM

## 2017-11-26 DIAGNOSIS — R2681 Unsteadiness on feet: Secondary | ICD-10-CM | POA: Diagnosis not present

## 2017-11-26 DIAGNOSIS — M25661 Stiffness of right knee, not elsewhere classified: Secondary | ICD-10-CM | POA: Diagnosis not present

## 2017-11-26 DIAGNOSIS — R296 Repeated falls: Secondary | ICD-10-CM

## 2017-11-26 NOTE — Therapy (Signed)
[facility] [address] [place], [place], [address] Phone: [phone]   Fax:  [phone]  Physical Therapy Treatment  Patient Details  Name: Samuel Roth MRN: [id number] Date of Birth: 1951-09-16 Referring Provider: Meridee Score, MD   Encounter Date: 11/26/2017  PT End of Session - 11/26/17 1211    Visit Number  28    Number of Visits  31    Date for PT Re-Evaluation  11/29/17    Authorization Type  BCBS state primary & Medicare 2nd PT sent recert 45/80    Authorization Time Period  $1250 deductable met; 0 visit limit, no auth    PT Start Time  1104    PT Stop Time  1155    PT Time Calculation (min)  51 min    Equipment Utilized During Treatment  Gait belt    Activity Tolerance  Patient tolerated treatment well    Behavior During Therapy  WFL for tasks assessed/performed       Past Medical History:  Diagnosis Date  . Glaucoma   . Legally blind     Past Surgical History:  Procedure Laterality Date  . AMPUTATION Right 03/01/2017   Procedure: RIGHT TRANS METATARSAL AMPUTATION;  Surgeon: Newt Minion, MD;  Location: [facility];  Service: Orthopedics;  Laterality: Right;  . AMPUTATION Right 03/03/2017   Procedure: RIGHT BKA;  Surgeon: Newt Minion, MD;  Location: [facility];  Service: Orthopedics;  Laterality: Right;  . EYE SURGERY    . TIBIA FRACTURE SURGERY      There were no vitals filed for this visit.  Subjective Assessment - 11/26/17 1107    Subjective  pt reports no fall throught the day. pt continues to wear proshtesis all awake hours without issues.    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  No/denies         [facility] PT  Assessment - 11/26/17 1100      Berg Balance Test   Sit to Stand  Able to stand without using hands and stabilize independently    Standing Unsupported  Able to stand safely 2 minutes    Sitting with Back Unsupported but Feet Supported on Floor or Stool  Able to sit safely and securely 2 minutes    Stand to Sit  Sits safely with minimal use of hands    Transfers  Able to transfer safely, minor use of hands    Standing Unsupported with Eyes Closed  Able to stand 10 seconds with supervision    Standing Ubsupported with Feet Together  Able to place feet together independently and stand 1 minute safely    From Standing, Reach Forward with Outstretched Arm  Can reach confidently >25 cm (10")    From Standing Position, Pick up Object from Floor  Able to pick up shoe, needs supervision    From Standing Position, Turn to Look Behind Over each Shoulder  Turn sideways only but maintains balance    Turn 360 Degrees  Able to turn 360 degrees safely in 4 seconds or less    Standing Unsupported, Alternately Place Feet on Step/Stool  Able to complete >2 steps/needs minimal assist    Standing Unsupported, One  Foot in [place] to plae foot ahead of the other independently and hold 30 seconds    Standing on One Leg  Tries to lift leg/unable to hold 3 seconds but remains standing independently    Total Score  45                  [facility] Adult PT Treatment/Exercise - 11/26/17 1105      Transfers   Transfers  Sit to Stand;Stand to Sit    Sit to Stand  6: Modified independent (Device/Increase time);With upper extremity assist;From chair/3-in-1;With armrests    Stand to Sit  6: Modified independent (Device/Increase time);With upper extremity assist;To chair/3-in-1;With armrests      Ambulation/Gait   Ambulation/Gait  Yes    Ambulation/Gait Assistance  6: Modified independent (Device/Increase time);5: Supervision supervision for vision guidance    Ambulation/Gait Assistance Details  supervision  only for visual guidance in open areas    Ambulation Distance (Feet)  500 Feet 800 total with intermittent break for other theraputic activ    Assistive device  Straight cane    Gait Pattern  Step-through pattern;Decreased step length - left;Decreased stance time - right;Decreased stride length    Ambulation Surface  Level;Indoor;Outdoor;Gravel;Grass;Unlevel;Paved    Stairs  Yes    Stairs Assistance  4: Min assist;6: Modified independent (Device/Increase time)    Stairs Assistance Details (indicate cue type and reason)  Min A(HHA) with stair without rail.  Mod I with 1 rail     Stair Management Technique  One rail Left;No rails;One rail Right;Step to pattern;With cane    Number of Stairs  4    Height of Stairs  6    Ramp  6: Modified independent (Device)    Ramp Details (indicate cue type and reason)  Pt able to self adjust to change in surface and grade of ramp without verbal cueing    Curb  6: Modified independent (Device/increase time)    Curb Details (indicate cue type and reason)  Pt self evaluated location and hight of curd, demonstrateing good weight shift over curb             PT Education - 11/26/17 1208    Education provided  Yes    Education Details  PT goals, future socket revision and fitting. stair training without  railing support    Person(s) Educated  Patient    Methods  Explanation;Demonstration;Verbal cues    Comprehension  Verbalized understanding;Returned demonstration       PT Short Term Goals - 10/29/17 2205      PT SHORT TERM GOAL #1   Title  Patient verbalizes proper care of residual limb including adjusting ply socks & sweat management.  (All STGs Target Date 11/01/17)    Baseline  MET 10/29/2017    Time  4    Period  Weeks    Status  Achieved      PT SHORT TERM GOAL #2   Title  Patient tolerates wear of prosthesis >15 hrs total /day without skin issues.     Baseline  Partially MET 10/29/2017 on work days or days he is out of apt. otherwise he  reports spending time in bed which limits total wear time.     Time  4    Period  Weeks    Status  Partially Met      PT SHORT TERM GOAL #3   Title  Patient ambulates 300' with straight cane, Blind cane & prosthesis outdoors including ramps &  curbs with supervision.     Baseline  MET 10/29/2017    Time  4    Period  Weeks    Status  Achieved      PT SHORT TERM GOAL #4   Title  Patient ambulates 3' with prosthesis only with min guard.     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    Baseline  MET 10/22/2017    Time  4    Period  Weeks    Status  Achieved        PT Long Term Goals - 11/26/17 1141      PT LONG TERM GOAL #1   Title  Patient demonstrates & verbalizes proper prosthetic care to enable safe prosthesis use. (All LTGs Target Date 11/29/17)    Baseline  Progressing 10/03/2017 Patient requires cues for skin management as he presented with 2nd blister on medial proximal knee.     Time  8    Period  Weeks    Status  On-going      PT LONG TERM GOAL #2   Title  Patient tolerates prosthesis wear >90% of awake hours without skin issues or limb pain to enable function throughout his day.     Baseline  MET 11/26/2017 pt wears during all awake hours     Time  8    Period  Weeks    Status  Achieved    Target Date  11/29/17      PT LONG TERM GOAL #3   Title  Berg Balance >/= 40/56 to indicate lower fall risk.     Baseline  MET 2/19 Berg 45/56    Time  8    Period  Weeks    Status  Achieved    Target Date  11/29/17      PT LONG TERM GOAL #4   Title  Patient ambulates around furniture with prosthesis only with assist for vision (reports independent in his apt).     Baseline  NOT MET 09/30/2017  patient requires minA for gait with prosthesis only.     Time  8    Period  Weeks    Status  On-going      PT LONG TERM GOAL #5   Title   Patient ambulates 500' with straight cane, Blind cane & prosthesis with supervision for vision without balance loss for community mobility.     Baseline  MET 11/26/2017 500' with cane Mod I     Time  8    Period  Weeks    Status  Achieved    Target Date  11/29/17      PT LONG TERM GOAL #6   Title  Patient negotiates stairs with 1 rail, ramps & curbs with cane, Blind cane & prosthesis with supervision for vision for community access.     Baseline  MET 11/26/2017 Mod I     Time  8    Period  Weeks    Status  Achieved    Target Date  11/29/17      PT LONG TERM GOAL #7   Title  Patient demonstrates work simulation tasks including lifting and reaching with prosthesis safely.     Time  8    Period  Weeks  Status  On-going      PT LONG TERM GOAL #8   Title  Patient reports limb pain </= 2/10 with standing activities.     Baseline  MET 10/03/2017    Time  8    Period  Weeks    Status  Achieved            Plan - 11/26/17 1212    Clinical Impression Statement  Today pt demonstrated his improvements in balance and his functional mobility by meeting 4/8 goals. Pt significantly improved his Berg balance score (45/56).  Pt demonstrated independence with navigation of ramps, curbs, and stairs and verbalized how he would negotiate stairs without rails (HHA from his wife). Pt is on track to meet all his LTGs by D/C date.      Clinical Impairments Affecting Rehab Potential  right TTA, DM1, neuropathy, acute renal failure, COPD, glaucoma, legally blind, HTN,     PT Frequency  2x / week    PT Duration  8 weeks    PT Treatment/Interventions  ADLs/Self Care Home Management;DME Instruction;Gait training;Stair training;Functional mobility training;Therapeutic activities;Therapeutic exercise;Balance training;Neuromuscular re-education;Patient/family education;Prosthetic Training    PT Next Visit Plan  pt will contineu to check remaining LTGs to continue to progress towards D/C( verbilization of  prosthetic care, resitual limb careand reduced pain during ambulation, as well as work related tasks and amb without cane to simulate home environment)    Consulted and Agree with Plan of Care  Patient    Family Member Consulted  wife       Patient will benefit from skilled therapeutic intervention in order to improve the following deficits and impairments:  Abnormal gait, Decreased activity tolerance, Decreased balance, Decreased endurance, Decreased knowledge of use of DME, Decreased mobility, Decreased range of motion, Decreased strength, Impaired flexibility, Impaired vision/preception, Postural dysfunction, Prosthetic Dependency, Pain  Visit Diagnosis: Muscle weakness (generalized)  Unsteadiness on feet  Repeated falls  Other abnormalities of gait and mobility     Problem List Patient Active Problem List   Diagnosis Date Noted  . Acquired absence of right leg below knee ([facility]) 03/13/2017  . Diabetic polyneuropathy associated with type 1 diabetes mellitus ([facility])   . Diabetes mellitus, new onset ([place])   . Acute renal failure ([place])   . Acute kidney injury ([place]) 02/27/2017  . Hyperglycemia 02/27/2017  . Leukocytosis 02/27/2017    Waunita Schooner SPT  11/26/2017, 12:23 PM  [facility] [address] [place], [place], [address] Phone: [phone]   Fax:  [phone]  Name: Samuel Roth MRN: [id number] Date of Birth: 1951/01/26

## 2017-11-28 ENCOUNTER — Encounter: Payer: Self-pay | Admitting: Physical Therapy

## 2017-11-28 ENCOUNTER — Ambulatory Visit: Payer: BC Managed Care – PPO | Admitting: Physical Therapy

## 2017-11-28 DIAGNOSIS — R2689 Other abnormalities of gait and mobility: Secondary | ICD-10-CM | POA: Diagnosis not present

## 2017-11-28 DIAGNOSIS — R2681 Unsteadiness on feet: Secondary | ICD-10-CM

## 2017-11-28 DIAGNOSIS — M6281 Muscle weakness (generalized): Secondary | ICD-10-CM

## 2017-11-28 DIAGNOSIS — M25661 Stiffness of right knee, not elsewhere classified: Secondary | ICD-10-CM | POA: Diagnosis not present

## 2017-11-28 DIAGNOSIS — R296 Repeated falls: Secondary | ICD-10-CM

## 2017-11-28 NOTE — Therapy (Signed)
[facility] [address] [place], [place], [address] Phone: [phone]   Fax:  [phone]  Physical Therapy Treatment  Patient Details  Name: Samuel Roth MRN: [id number] Date of Birth: 1951-09-25 Referring Provider: Meridee Score, MD   Encounter Date: 11/28/2017  PT End of Session - 11/28/17 1357    Visit Number  29    Number of Visits  31    Date for PT Re-Evaluation  11/29/17    Authorization Type  BCBS state primary & Medicare 2nd PT sent recert 02/63    Authorization Time Period  $1250 deductable met; 0 visit limit, no auth    PT Start Time  1100    PT Stop Time  1130    PT Time Calculation (min)  30 min    Equipment Utilized During Treatment  Gait belt    Activity Tolerance  Patient tolerated treatment well    Behavior During Therapy  [facility] for tasks assessed/performed       Past Medical History:  Diagnosis Date  . Glaucoma   . Legally blind     Past Surgical History:  Procedure Laterality Date  . AMPUTATION Right 03/01/2017   Procedure: RIGHT TRANS METATARSAL AMPUTATION;  Surgeon: Newt Minion, MD;  Location: [place];  Service: Orthopedics;  Laterality: Right;  . AMPUTATION Right 03/03/2017   Procedure: RIGHT BKA;  Surgeon: Newt Minion, MD;  Location: [place];  Service: Orthopedics;  Laterality: Right;  . EYE SURGERY    . TIBIA FRACTURE SURGERY      There were no vitals filed for this visit.  Subjective Assessment - 11/28/17 1106    Subjective  pt report he has not had any fall since his last PT session.  Pt reports he has been running late today " rough morning".     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  No/denies                       [facility] Adult PT Treatment/Exercise - 11/28/17 1100      Transfers   Transfers  Sit to Stand;Stand to Sit;Floor to Transfer    Sit to Stand  6: Modified independent (Device/Increase time);With upper extremity assist;From chair/3-in-1;With armrests    Stand to Sit  6: Modified independent (Device/Increase time);With upper extremity assist;To chair/3-in-1;With armrests    Floor to Transfer  6: Modified independent (Device/Increase time);With upper extremity assist      Ambulation/Gait   Ambulation/Gait  Yes    Ambulation/Gait Assistance  6: Modified independent (Device/Increase time)    Ambulation/Gait Assistance Details  pt able to ambulate without use of AD 47f around obsticles(tables and tables)    Ambulation Distance (Feet)  300 Feet    Assistive device  Straight cane;None    Gait Pattern  Step-through pattern;Decreased step length - left;Decreased stance time - right;Decreased stride length    Ambulation Surface  Level;Indoor      Therapeutic Activites    Therapeutic Activities  Work [employer]   Work Simulation  picking up 3 objects( wood Box) form 2" above flood and placing them on top shelf or cart, 2 objects from middle wall shelf to  middle shelf on cart( thigh level). As well as a 12.5# and 7.5# cuff weight from a hip height counter to the bottom shelf of the cart. Pt then transported cart 115 ft. and returned Items to the wall shelves. Mod I             PT Education - 11/28/17 1356    Education provided  Yes    Education Details  PT D/C, continueation of HEP program and fitness, and prosthesis care and socket revisions.    Person(s) Educated  Patient    Methods  Explanation;Demonstration    Comprehension  Verbalized understanding       PT Short Term Goals - 10/29/17 2205      PT SHORT TERM GOAL #1   Title  Patient verbalizes proper care of residual limb including adjusting ply socks & sweat management.  (All STGs Target Date  11/01/17)    Baseline  MET 10/29/2017    Time  4    Period  Weeks    Status  Achieved      PT SHORT TERM GOAL #2   Title  Patient tolerates wear of prosthesis >15 hrs total /day without skin issues.     Baseline  Partially MET 10/29/2017 on work days or days he is out of apt. otherwise he reports spending time in bed which limits total wear time.     Time  4    Period  Weeks    Status  Partially Met      PT SHORT TERM GOAL #3   Title  Patient ambulates 300' with straight cane, Blind cane & prosthesis outdoors including ramps & curbs with supervision.     Baseline  MET 10/29/2017    Time  4    Period  Weeks    Status  Achieved      PT SHORT TERM GOAL #4   Title  Patient ambulates 65' with prosthesis only with min guard.     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    Baseline  MET 10/22/2017    Time  4    Period  Weeks    Status  Achieved        PT Long Term Goals - 11/28/17 1406      PT LONG TERM GOAL #1   Title  Patient demonstrates & verbalizes proper prosthetic care to enable safe prosthesis use. (All LTGs Target Date 11/29/17)    Baseline  MET 11/28/2017    Time  8    Period  Weeks    Status  Achieved      PT LONG TERM GOAL #2   Title  Patient tolerates prosthesis wear >90% of awake hours without skin issues or limb pain to enable function throughout his day.     Baseline  MET 11/26/2017 pt wears during all awake hours     Time  8    Period  Weeks    Status  Achieved      PT LONG TERM GOAL #3   Title  Berg Balance >/= 40/56 to indicate lower fall risk.     Baseline  MET 2/19 Berg 45/56    Time  8    Period  Weeks    Status  Achieved  PT LONG TERM GOAL #4   Title  Patient ambulates around furniture with prosthesis only with assist for vision (reports independent in his apt).     Baseline  MET 11/28/2017 ambulates 90  ft  around tables, chairs, and short open distances.     Time  8    Period  Weeks    Status  Achieved      PT LONG TERM GOAL #5   Title  Patient ambulates 500' with straight cane, Blind cane & prosthesis with supervision for vision without balance loss for community mobility.     Baseline  MET 11/26/2017 500' with cane Mod I     Time  8    Period  Weeks    Status  Achieved      PT LONG TERM GOAL #6   Title  Patient negotiates stairs with 1 rail, ramps & curbs with cane, Blind cane & prosthesis with supervision for vision for community access.     Baseline  MET 11/26/2017 Mod I     Time  8    Period  Weeks    Status  Achieved      PT LONG TERM GOAL #7   Title  Patient demonstrates work simulation tasks including lifting and reaching with prosthesis safely.     Baseline  MET 11/28/2017 pt loaded up a work cart with large, medium and small wooden boxes and cuff weights (7.5- 12.5 pounds) on 3 different hight levels ( hip,knee, and 2-3" above floor). Mod I. pt also completed stand to floor transfer at the Mod I assistance level.     Time  8    Period  Weeks    Status  Achieved      PT LONG TERM GOAL #8   Title  Patient reports limb pain </= 2/10 with standing activities.     Baseline  MET 10/03/2017    Time  8    Period  Weeks    Status  Achieved            Plan - 11/28/17 1358    Clinical Impression Statement  Patient has met all his physical therapy long term goals. He appears independent & safe with prosthetic care & use, wearing all awake hours for function during his day. He is able to ambulate in simulated home or office (short distance, familar, level with no steps or inclines) with prosthesis only.  He can ambulate in community with single cane & prosthesis on days with better vision and uses second sensing cane on poor vision days including dark or overcast days.    Rehab Potential  Good    PT Next Visit Plan  discharge PT,     Recommended Other Services  continue regular  follow-up with prosthetists    Consulted and Agree with Plan of Care  Patient    Family Member Consulted  --       Patient will benefit from skilled therapeutic intervention in order to improve the following deficits and impairments:     Visit Diagnosis: Unsteadiness on feet  Muscle weakness (generalized)  Repeated falls  Other abnormalities of gait and mobility     Problem List Patient Active Problem List   Diagnosis Date Noted  . Acquired absence of right leg below knee ([place]) 03/13/2017  . Diabetic polyneuropathy associated with type 1 diabetes mellitus ([place])   . Diabetes mellitus, new onset ([place])   . Acute renal failure ([place])   . Acute kidney injury ([place])  02/27/2017  . Hyperglycemia 02/27/2017  . Leukocytosis 02/27/2017  PHYSICAL THERAPY DISCHARGE SUMMARY  Visits from Start of Care: 29  Current functional level related to goals / functional outcomes: See above    Remaining deficits: See above    Education / Equipment: Prosthetic care & use, HEP    Plan: Patient agrees to discharge.  Patient goals were met. Patient is being discharged due to meeting the stated rehab goals.  ?????   Waunita Schooner, SPT 11/28/2017, 2:12 PM        WALDRON,ROBINPT, DPT 11/29/2017, 10:09 AM  [facility] [address] [place], [place], [address] Phone: [phone]   Fax:  [phone]  Name: Aiven Kampe MRN: [id number] Date of Birth: 1951/01/18

## 2017-12-03 ENCOUNTER — Encounter: Payer: Medicare Other | Admitting: Physical Therapy

## 2017-12-05 ENCOUNTER — Encounter: Payer: Medicare Other | Admitting: Physical Therapy

## 2017-12-10 DIAGNOSIS — H04123 Dry eye syndrome of bilateral lacrimal glands: Secondary | ICD-10-CM | POA: Diagnosis not present

## 2017-12-10 DIAGNOSIS — H548 Legal blindness, as defined in USA: Secondary | ICD-10-CM | POA: Diagnosis not present

## 2017-12-10 DIAGNOSIS — H401133 Primary open-angle glaucoma, bilateral, severe stage: Secondary | ICD-10-CM | POA: Diagnosis not present

## 2018-03-26 ENCOUNTER — Telehealth (INDEPENDENT_AMBULATORY_CARE_PROVIDER_SITE_OTHER): Payer: Self-pay | Admitting: Orthopedic Surgery

## 2018-03-26 ENCOUNTER — Encounter (INDEPENDENT_AMBULATORY_CARE_PROVIDER_SITE_OTHER): Payer: Self-pay | Admitting: Family

## 2018-03-26 ENCOUNTER — Other Ambulatory Visit (INDEPENDENT_AMBULATORY_CARE_PROVIDER_SITE_OTHER): Payer: Self-pay

## 2018-03-26 ENCOUNTER — Ambulatory Visit (INDEPENDENT_AMBULATORY_CARE_PROVIDER_SITE_OTHER): Payer: BC Managed Care – PPO | Admitting: Family

## 2018-03-26 VITALS — Ht 69.0 in | Wt 169.0 lb

## 2018-03-26 DIAGNOSIS — L03115 Cellulitis of right lower limb: Secondary | ICD-10-CM

## 2018-03-26 DIAGNOSIS — Z89511 Acquired absence of right leg below knee: Secondary | ICD-10-CM | POA: Diagnosis not present

## 2018-03-26 MED ORDER — DOXYCYCLINE HYCLATE 100 MG PO TABS: 100.0000 mg | ORAL_TABLET | Freq: Two times a day (BID) | ORAL | 0 refills | Status: DC

## 2018-03-26 NOTE — Telephone Encounter (Signed)
Patient called asking for [facility] [address], said that it was sent to the wrong location. Also wanted to know if he could start taking the antibiotics tomorrow? CB # [phone]

## 2018-03-26 NOTE — Telephone Encounter (Signed)
I CALLED PT TO ADVISE THAT THIS HAS BEEN FAXED TO THE PHARM AND THAT YES HE IS OK TO START THE ABX TOMORROW.

## 2018-04-01 ENCOUNTER — Encounter (INDEPENDENT_AMBULATORY_CARE_PROVIDER_SITE_OTHER): Payer: Self-pay | Admitting: Family

## 2018-04-01 NOTE — Progress Notes (Signed)
Office Visit Note   Patient: Samuel SimsJohn Roth           Date of Birth: Nov 06, 1950           MRN: [id number] Visit Date: 03/26/2018              Requested by: No referring provider defined for this encounter. PCP: Patient, No Pcp Per  Chief Complaint  Patient presents with  . Right Leg - Follow-up      HPI: The patient is a 67 year old gentleman who presents today with a concern for a blister to his right residual limb.  He is in a wheelchair today has not been wearing his prosthetic has had blistered ulceration for several days.  Draining clear fluid.  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 Denies fevers or chills.  Assessment & Plan: Visit Diagnoses:  1. Cellulitis of right lower limb   2. Acquired absence of right leg below knee (HCC)     Plan: No aware of prosthesis until healed.  Will provide prescription for oral antibiotics.  Daily wound care with antibacterial ointment dressings.  Discussed strict return precautions patient voiced understanding.  Follow-up in the office in 1week  Follow-Up Instructions: Return in about 1 week (around 04/02/2018).   Ortho Exam  Patient is alert, oriented, no adenopathy, well-dressed, normal affect, normal respiratory effort. To the right residual limb there is a ulceration from an bearing this is 5 mm in diameter and 5 millimeters deep.  There is clear drainage.  Surrounding erythema and induration.  Warmth.  No ascending cellulitis no red streaking.  Imaging: No results found. No images are attached to the encounter.  Labs: Lab Results  Component Value Date   HGBA1C 9.5 (H) 02/28/2017   CRP 29.1 (H) 03/01/2017   REPTSTATUS 03/04/2017 FINAL 02/27/2017   CULT  02/27/2017    NO GROWTH 5 DAYS Performed at [facility], [address] [place], [place] [address]      Lab Results  Component Value Date   ALBUMIN 1.7 (L) 03/05/2017   ALBUMIN 1.7 (L) 03/01/2017   ALBUMIN 2.1 (L) 02/28/2017    Body  mass index is 24.96 kg/m.  Orders:  No orders of the defined types were placed in this encounter.  Meds ordered this encounter  Medications  . DISCONTD: doxycycline (VIBRA-TABS) 100 MG tablet    Sig: Take 1 tablet (100 mg total) by mouth 2 (two) times daily.    Dispense:  60 tablet    Refill:  0     Procedures: No procedures performed  Clinical Data: No additional findings.  ROS:  All other systems negative, except as noted in the HPI. Review of Systems  Constitutional: Negative for chills and fever.  Cardiovascular: Negative for leg swelling.  Skin: Positive for color change and wound. Negative for rash.    Objective: Vital Signs: Ht 5\' 9"  (1.753 m)   Wt 169 lb (76.7 kg)   BMI 24.96 kg/m   Specialty Comments:  No specialty comments available.  PMFS History: Patient Active Problem List   Diagnosis Date Noted  . Acquired absence of right leg below knee (HCC) 03/13/2017  . 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 Diabetes mellitus, new onset (HCC)   . Acute renal failure (HCC)   . Acute kidney injury (HCC) 02/27/2017  . Hyperglycemia 02/27/2017  . Leukocytosis 02/27/2017   Past Medical History:  Diagnosis Date  . Glaucoma   .  Legally blind     History reviewed. No pertinent family history.  Past Surgical History:  Procedure Laterality Date  . AMPUTATION Right 03/01/2017   Procedure: RIGHT TRANS METATARSAL AMPUTATION;  Surgeon: Nadara Mustard, MD;  Location[facility] OR;  Service: Orthopedics;  Laterality: Right;  . AMPUTATION Right 03/03/2017   Procedure: RIGHT BKA;  Surgeon: Nadara Mustard, MD;  Location: [facility] OR;  Service: Orthopedics;  Laterality: Right;  . EYE SURGERY    . TIBIA FRACTURE SURGERY     Social History   Occupational History  . Not on file  Tobacco Use  . Smoking status: Former Games developer  . Smokeless tobacco: Former Engineer, water and Sexual Activity  . Alcohol use: Yes    Comment: social  . Drug use: No  .  Sexual activity: Not on file

## 2018-04-02 ENCOUNTER — Encounter (INDEPENDENT_AMBULATORY_CARE_PROVIDER_SITE_OTHER): Payer: Self-pay | Admitting: Family

## 2018-04-02 ENCOUNTER — Ambulatory Visit (INDEPENDENT_AMBULATORY_CARE_PROVIDER_SITE_OTHER): Payer: BC Managed Care – PPO | Admitting: Family

## 2018-04-02 DIAGNOSIS — Z89511 Acquired absence of right leg below knee: Secondary | ICD-10-CM | POA: Diagnosis not present

## 2018-04-14 ENCOUNTER — Encounter (INDEPENDENT_AMBULATORY_CARE_PROVIDER_SITE_OTHER): Payer: Self-pay | Admitting: Family

## 2018-04-14 NOTE — Progress Notes (Signed)
Office Visit Note   Patient: Samuel Roth           Date of Birth: 26-May-1951           MRN: [id number] Visit Date: 04/02/2018              Requested by: No referring provider defined for this encounter. PCP: Patient, No Pcp Per  Chief Complaint  Patient presents with  . Right Knee - Wound Check      HPI: The patient is a 67 year old gentleman who presents today in follow up for ulceration to tip of his right residual limb.  He is in a wheelchair today has not been wearing his prosthetic. Continues to have little drainage, clear fluid.  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 Denies fevers or chills.  Ulcer improved from last week.  Assessment & Plan: Visit Diagnoses:  1. Acquired absence of right leg below knee (HCC)     Plan: No wear of prosthesis until healed.  Daily wound care with antibacterial ointment dressings.  Discussed strict return precautions patient voiced understanding.  Follow-up in the office in 2 weeks.  Follow-Up Instructions: Return in about 2 weeks (around 04/16/2018).   Ortho Exam  Patient is alert, oriented, no adenopathy, well-dressed, normal affect, normal respiratory effort. To the right residual limb there is a ulceration from an bearing this is 5 mm in diameter and 2 millimeters deep.  There is scant clear drainage.  No surrounding erythema and induration.  No Warmth.  No ascending cellulitis no red streaking.  Imaging: No results found. No images are attached to the encounter.  Labs: Lab Results  Component Value Date   HGBA1C 9.5 (H) 02/28/2017   CRP 29.1 (H) 03/01/2017   REPTSTATUS 03/04/2017 FINAL 02/27/2017   CULT  02/27/2017    NO GROWTH 5 DAYS Performed at [facility], [address], [place], [place] [address]      Lab Results  Component Value Date   ALBUMIN 1.7 (L) 03/05/2017   ALBUMIN 1.7 (L) 03/01/2017   ALBUMIN 2.1 (L) 02/28/2017    There is no height or weight on file to calculate  BMI.  Orders:  No orders of the defined types were placed in this encounter.  No orders of the defined types were placed in this encounter.    Procedures: No procedures performed  Clinical Data: No additional findings.  ROS:  All other systems negative, except as noted in the HPI. Review of Systems  Constitutional: Negative for chills and fever.  Cardiovascular: Negative for leg swelling.  Skin: Positive for color change and wound. Negative for rash.    Objective: Vital Signs: There were no vitals taken for this visit.  Specialty Comments:  No specialty comments available.  PMFS History: Patient Active Problem List   Diagnosis Date Noted  . Acquired absence of right leg below knee (HCC) 03/13/2017  . 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 Diabetes mellitus, new onset (HCC)   . Acute renal failure (HCC)   . Acute kidney injury (HCC) 02/27/2017  . Hyperglycemia 02/27/2017  . Leukocytosis 02/27/2017   Past Medical History:  Diagnosis Date  . Glaucoma   . Legally blind     History reviewed. No pertinent family history.  Past Surgical History:  Procedure Laterality Date  . AMPUTATION Right 03/01/2017   Procedure: RIGHT TRANS METATARSAL AMPUTATION;  Surgeon: Nadara Mustard, MD;  Location: [facility] OR;  Service: Orthopedics;  Laterality: Right;  . AMPUTATION Right 03/03/2017   Procedure: RIGHT BKA;  Surgeon: Nadara Mustarduda, Marcus V, MD;  [facility];  Service: Orthopedics;  Laterality: Right;  . EYE SURGERY    . TIBIA FRACTURE SURGERY     Social History   Occupational History  . Not on file  Tobacco Use  . Smoking status: Former Games developermoker  . Smokeless tobacco: Former Engineer, waterUser  Substance and Sexual Activity  . Alcohol use: Yes    Comment: social  . Drug use: No  . Sexual activity: Not on file

## 2018-04-16 ENCOUNTER — Encounter (INDEPENDENT_AMBULATORY_CARE_PROVIDER_SITE_OTHER): Payer: Self-pay | Admitting: Family

## 2018-04-16 ENCOUNTER — Ambulatory Visit (INDEPENDENT_AMBULATORY_CARE_PROVIDER_SITE_OTHER): Payer: BC Managed Care – PPO | Admitting: Family

## 2018-04-16 VITALS — Ht 69.0 in | Wt 169.0 lb

## 2018-04-16 DIAGNOSIS — Z89511 Acquired absence of right leg below knee: Secondary | ICD-10-CM

## 2018-04-16 DIAGNOSIS — S88111S Complete traumatic amputation at level between knee and ankle, right lower leg, sequela: Secondary | ICD-10-CM

## 2018-04-16 NOTE — Progress Notes (Signed)
Office Visit Note   Patient: Samuel Roth           Date of Birth: Jun 27, 1951           MRN: [id number] Visit Date: 04/16/2018              Requested by: No referring provider defined for this encounter. PCP: Patient, No Pcp Per  Chief Complaint  Patient presents with  . Right Leg - Routine Post Op    03/03/18 right BKA      HPI: The patient is a 67 year old gentleman who presents today in follow up for ulceration to tip of his right residual limb.  He is in a wheelchair today has not been wearing his prosthetic. Denies drainage today. 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 Denies fevers or chills.  Ulcer improved from last week.  Assessment & Plan: Visit Diagnoses:  No diagnosis found.  Plan: No wear of prosthesis until healed.  Daily wound care with antibacterial ointment dressings.  Discussed strict return precautions patient voiced understanding.  Follow-up in the office in 3 weeks.  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  Follow-Up Instructions: No follow-ups on file.   Ortho Exam  Patient is alert, oriented, no adenopathy, well-dressed, normal affect, normal respiratory effort. To the right residual limb there is a ulceration from an bearing this is 3 mm in diameter and 2 millimeters deep.  There is scant clear drainage.  No surrounding erythema and induration.  No Warmth.  No ascending cellulitis no red streaking.  Imaging: No results found. No images are attached to the encounter.  Labs: Lab Results  Component Value Date   HGBA1C 9.5 (H) 02/28/2017   CRP 29.1 (H) 03/01/2017   REPTSTATUS 03/04/2017 FINAL 02/27/2017   CULT  02/27/2017    NO GROWTH 5 DAYS Performed at [facility], [address] [place], [place] [address]      Lab Results  Component Value Date   ALBUMIN 1.7 (L) 03/05/2017   ALBUMIN 1.7 (L)  03/01/2017   ALBUMIN 2.1 (L) 02/28/2017    Body mass index is 24.96 kg/m.  Orders:  No orders of the defined types were placed in this encounter.  No orders of the defined types were placed in this encounter.    Procedures: No procedures performed  Clinical Data: No additional findings.  ROS:  All other systems negative, except as noted in the HPI. Review of Systems  Constitutional: Negative for chills and fever.  Cardiovascular: Negative for leg swelling.  Skin: Positive for color change and wound. Negative for rash.    Objective: Vital Signs: Ht 5\' 9"  (1.753 m)   Wt 169 lb (76.7 kg)   BMI 24.96 kg/m   Specialty Comments:  No specialty comments available.  PMFS History: Patient Active Problem List   Diagnosis Date Noted  . Acquired absence of right leg below knee (HCC) 03/13/2017  . 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 Diabetes mellitus, new onset (HCC)   . Acute renal failure (HCC)   . Acute kidney injury (HCC) 02/27/2017  . Hyperglycemia 02/27/2017  . Leukocytosis 02/27/2017   Past Medical History:  Diagnosis Date  . Glaucoma   . Legally blind     No family history on file.  Past Surgical History:  Procedure Laterality Date  . AMPUTATION Right  03/01/2017   Procedure: RIGHT TRANS METATARSAL AMPUTATION;  Surgeon: Nadara Mustard, MD;  Location: [facility] OR;  Service: Orthopedics;  Laterality: Right;  . AMPUTATION Right 03/03/2017   Procedure: RIGHT BKA;  Surgeon: Nadara Mustard, MD;  Location: [facility] OR;  Service: Orthopedics;  Laterality: Right;  . EYE SURGERY    . TIBIA FRACTURE SURGERY     Social History   Occupational History  . Not on file  Tobacco Use  . Smoking status: Former Games developer  . Smokeless tobacco: Former Engineer, water and Sexual Activity  . Alcohol use: Yes    Comment: social  . Drug use: No  . Sexual activity: Not on file

## 2018-04-21 ENCOUNTER — Telehealth (INDEPENDENT_AMBULATORY_CARE_PROVIDER_SITE_OTHER): Payer: Self-pay | Admitting: Orthopedic Surgery

## 2018-04-21 NOTE — Telephone Encounter (Signed)
[facility] is saying [facility], and he was told by Denny PeonErin he wouldn't have to take anymore. Patient needs to know and be aware of the medication he should be taking since he was not told another was called in. Please call back as soon as possible. CB # [phone]

## 2018-04-21 NOTE — Telephone Encounter (Signed)
I called the pt and advised that I called the pharmacy and the rx for doxycyline was not faxed in this most recent visit but rather from his visit 03/26/18. We did not give rx for abx his visit last week.

## 2018-05-07 ENCOUNTER — Encounter (INDEPENDENT_AMBULATORY_CARE_PROVIDER_SITE_OTHER): Payer: Self-pay | Admitting: Family

## 2018-05-07 ENCOUNTER — Ambulatory Visit (INDEPENDENT_AMBULATORY_CARE_PROVIDER_SITE_OTHER): Payer: BC Managed Care – PPO | Admitting: Family

## 2018-05-07 VITALS — Ht 69.0 in | Wt 169.0 lb

## 2018-05-07 DIAGNOSIS — S88119S Complete traumatic amputation at level between knee and ankle, unspecified lower leg, sequela: Secondary | ICD-10-CM

## 2018-05-07 DIAGNOSIS — Z89511 Acquired absence of right leg below knee: Secondary | ICD-10-CM

## 2018-05-07 NOTE — Progress Notes (Signed)
Office Visit Note   Patient: Samuel Roth           Date of Birth: [birth year]           MRN: [id number] Visit Date: 05/07/2018              Requested by: No referring provider defined for this encounter. PCP: Patient, No Pcp Per  Chief Complaint  Patient presents with  . Right Leg - Follow-up    03/03/17 right BKA      HPI: The patient is a 67 year old gentleman who presents today in follow-up for ulceration from an bearing to his right residual limb.  He is in a wheelchair.  Has not been wearing his prosthetic.  Has been doing his work seated.  Doing daily dressing changes with Neosporin and Band-Aid.  Ulcer continues to heal slowly.    Assessment & Plan: Visit Diagnoses:  1. Unilateral complete below knee amputation, sequela (HCC)   2. Acquired absence of right leg below knee (HCC)     Plan: He is to continue no use of prosthetic until wound has healed.  Continue his daily wound care regimen with antibacterial ointment dressings.  Follow-up in office in 4 weeks.     Follow-Up Instructions: Return in about 1 month (around 06/04/2018).   Ortho Exam  Patient is alert, oriented, no adenopathy, well-dressed, normal affect, normal respiratory effort. Ulcer to the center of right residual limb is now 5 mm x 1 mm this is improving.  There is 1 mm of depth.  No drainage no surrounding erythema no warmth no sign of infection   Imaging: No results found. No images are attached to the encounter.  Labs: Lab Results  Component Value Date   HGBA1C 9.5 (H) 02/28/2017   CRP 29.1 (H) 03/01/2017   REPTSTATUS 03/04/2017 FINAL 02/27/2017   CULT  02/27/2017    NO GROWTH 5 DAYS Performed at [facility], [address], [place], [place] [address]      Lab Results  Component Value Date   ALBUMIN 1.7 (L) 03/05/2017   ALBUMIN 1.7 (L) 03/01/2017   ALBUMIN 2.1 (L) 02/28/2017    Body mass index is 24.96 kg/m.  Orders:  No orders of the defined types were placed in this  encounter.  No orders of the defined types were placed in this encounter.    Procedures: No procedures performed  Clinical Data: No additional findings.  ROS:  All other systems negative, except as noted in the HPI. Review of Systems  Constitutional: Negative for chills and fever.  Cardiovascular: Negative for leg swelling.  Skin: Positive for color change and wound. Negative for rash.    Objective: Vital Signs: Ht 5\' 9"  (1.753 m)   Wt 169 lb (76.7 kg)   BMI 24.96 kg/m   Specialty Comments:  No specialty comments available.  PMFS History: Patient Active Problem List   Diagnosis Date Noted  . Acquired absence of right leg below knee (HCC) 03/13/2017  . 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 Diabetes mellitus, new onset (HCC)   . Acute renal failure (HCC)   . Acute kidney injury (HCC) 02/27/2017  . Hyperglycemia 02/27/2017  . Leukocytosis 02/27/2017   Past Medical History:  Diagnosis Date  . Glaucoma   . Legally blind     History reviewed. No pertinent family history.  Past Surgical History:  Procedure Laterality Date  . AMPUTATION Right 03/01/2017   Procedure: RIGHT TRANS METATARSAL  AMPUTATION;  Surgeon: Nadara Mustarduda, Marcus V, MD;  [facility];  Service: Orthopedics;  Laterality: Right;  . AMPUTATION Right 03/03/2017   Procedure: RIGHT BKA;  Surgeon: Nadara Mustarduda, Marcus V, MD;  [facility];  Service: Orthopedics;  Laterality: Right;  . EYE SURGERY    . TIBIA FRACTURE SURGERY     Social History   Occupational History  . Not on file  Tobacco Use  . Smoking status: Former Games developermoker  . Smokeless tobacco: Former Engineer, waterUser  Substance and Sexual Activity  . Alcohol use: Yes    Comment: social  . Drug use: No  . Sexual activity: Not on file

## 2018-05-14 ENCOUNTER — Telehealth (INDEPENDENT_AMBULATORY_CARE_PROVIDER_SITE_OTHER): Payer: Self-pay | Admitting: Family

## 2018-05-14 NOTE — Telephone Encounter (Signed)
Patient called wanting to know if he can qualify for a scooter. He is requesting 4 wheeler.  Please call patient to advise. He knows he would need a script for this.  [phone]

## 2018-05-16 NOTE — Telephone Encounter (Signed)
I called sw pt to advise that yes per Denny PeonErin we will fill out order for motorized device. I advised that he will need to select a facility and once they have determined what would be best suited for his needs they will send over paperwork that will need to be filled out documenting his need. To call with any questions.

## 2018-06-11 ENCOUNTER — Ambulatory Visit (INDEPENDENT_AMBULATORY_CARE_PROVIDER_SITE_OTHER): Payer: BC Managed Care – PPO | Admitting: Family

## 2018-06-11 ENCOUNTER — Encounter (INDEPENDENT_AMBULATORY_CARE_PROVIDER_SITE_OTHER): Payer: Self-pay | Admitting: Family

## 2018-06-11 DIAGNOSIS — Z89511 Acquired absence of right leg below knee: Secondary | ICD-10-CM | POA: Diagnosis not present

## 2018-06-11 DIAGNOSIS — S88119S Complete traumatic amputation at level between knee and ankle, unspecified lower leg, sequela: Secondary | ICD-10-CM

## 2018-06-11 NOTE — Progress Notes (Signed)
Office Visit Note   Patient: Samuel Roth           Date of Birth: 01-16-1951           MRN: [id number] Visit Date: 06/11/2018              Requested by: No referring provider defined for this encounter. PCP: Patient, No Pcp Per  No chief complaint on file.     HPI: Patient is a 67 year old gentleman who presents today in follow-up for ulceration to his below the knee amputation on the right.  This has healed.  He is in a wheelchair.  He has not been using his prosthesis.  He wonders if he can begin swimming for exercise.  Assessment & Plan: Visit Diagnoses:  1. Unilateral complete below knee amputation, sequela (HCC)     Plan: Will continue with close observation of the his residual limb.  May resume prosthetic use.  We will follow-up with biotech for possible modifications and padding in his socket.  May begin swimming.  Follow-up in office as needed.  Follow-Up Instructions: Return if symptoms worsen or fail to improve.   Ortho Exam  Patient is alert, oriented, no adenopathy, well-dressed, normal affect, normal respiratory effort. On examination of residual limb ulceration is fully healed there are no open areas no drainage no erythema limb is well consolidated.  Imaging: No results found. No images are attached to the encounter.  Labs: Lab Results  Component Value Date   HGBA1C 9.5 (H) 02/28/2017   CRP 29.1 (H) 03/01/2017   REPTSTATUS 03/04/2017 FINAL 02/27/2017   CULT  02/27/2017    NO GROWTH 5 DAYS Performed at [facility], [address], [place], [place] [address]      Lab Results  Component Value Date   ALBUMIN 1.7 (L) 03/05/2017   ALBUMIN 1.7 (L) 03/01/2017   ALBUMIN 2.1 (L) 02/28/2017    There is no height or weight on file to calculate BMI.  Orders:  No orders of the defined types were placed in this encounter.  No orders of the defined types were placed in this encounter.    Procedures: No procedures performed  Clinical Data: No  additional findings.  ROS:  All other systems negative, except as noted in the HPI. Review of Systems  Constitutional: Negative for chills and fever.  Skin: Negative for color change and wound.    Objective: Vital Signs: There were no vitals taken for this visit.  Specialty Comments:  No specialty comments available.  PMFS History: Patient Active Problem List   Diagnosis Date Noted  . Acquired absence of right leg below knee (HCC) 03/13/2017  . 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 Diabetes mellitus, new onset (HCC)   . Acute renal failure (HCC)   . Acute kidney injury (HCC) 02/27/2017  . Hyperglycemia 02/27/2017  . Leukocytosis 02/27/2017   Past Medical History:  Diagnosis Date  . Glaucoma   . Legally blind     History reviewed. No pertinent family history.  Past Surgical History:  Procedure Laterality Date  . AMPUTATION Right 03/01/2017   Procedure: RIGHT TRANS METATARSAL AMPUTATION;  Surgeon: Nadara Mustard, MD;  Location: [facility] OR;  Service: Orthopedics;  Laterality: Right;  . AMPUTATION Right 03/03/2017   Procedure: RIGHT BKA;  Surgeon: Nadara Mustard, MD;  Location: [facility] OR;  Service: Orthopedics;  Laterality: Right;  . EYE SURGERY    . TIBIA FRACTURE SURGERY  Social History   Occupational History  . Not on file  Tobacco Use  . Smoking status: Former Games developer  . Smokeless tobacco: Former Engineer, water and Sexual Activity  . Alcohol use: Yes    Comment: social  . Drug use: No  . Sexual activity: Not on file

## 2018-06-25 ENCOUNTER — Telehealth (INDEPENDENT_AMBULATORY_CARE_PROVIDER_SITE_OTHER): Payer: Self-pay | Admitting: Orthopedic Surgery

## 2018-06-25 ENCOUNTER — Other Ambulatory Visit (INDEPENDENT_AMBULATORY_CARE_PROVIDER_SITE_OTHER): Payer: Self-pay

## 2018-06-25 NOTE — Telephone Encounter (Signed)
Patient called stating that the sock that goes over the liner for his stump are getting thin.  He stated that he has the 1, 2, and 3 ply.  He was told from [facility] that he needs a prescription.  [phone].  Thank you.

## 2018-06-26 ENCOUNTER — Telehealth (INDEPENDENT_AMBULATORY_CARE_PROVIDER_SITE_OTHER): Payer: Self-pay | Admitting: Orthopedic Surgery

## 2018-06-26 NOTE — Telephone Encounter (Signed)
Prescription for prosthetic socks and supplies written. Please let patient know he can pick up.

## 2018-06-26 NOTE — Telephone Encounter (Signed)
Patient called adviised he needed the Rx to be faxed over to [facility]. Patient said he needed 1,3 and 5 ply. The number to contact patient is [phone]

## 2018-06-27 NOTE — Telephone Encounter (Signed)
Anything else I need to do with this?

## 2018-06-27 NOTE — Telephone Encounter (Signed)
Did you fax Rx? If not we need order fax to them.

## 2018-06-27 NOTE — Telephone Encounter (Signed)
No, its taken care of, Thank you.

## 2018-06-27 NOTE — Telephone Encounter (Signed)
Thank you, I will re-fax.

## 2018-07-02 DIAGNOSIS — H1820 Unspecified corneal edema: Secondary | ICD-10-CM | POA: Diagnosis not present

## 2018-07-02 DIAGNOSIS — H401133 Primary open-angle glaucoma, bilateral, severe stage: Secondary | ICD-10-CM | POA: Diagnosis not present

## 2018-07-18 DIAGNOSIS — H409 Unspecified glaucoma: Secondary | ICD-10-CM | POA: Diagnosis not present

## 2018-07-18 DIAGNOSIS — M7989 Other specified soft tissue disorders: Secondary | ICD-10-CM | POA: Diagnosis not present

## 2018-07-18 DIAGNOSIS — H16032 Corneal ulcer with hypopyon, left eye: Secondary | ICD-10-CM | POA: Diagnosis not present

## 2018-07-18 DIAGNOSIS — L03213 Periorbital cellulitis: Secondary | ICD-10-CM | POA: Diagnosis not present

## 2018-07-18 DIAGNOSIS — H44521 Atrophy of globe, right eye: Secondary | ICD-10-CM | POA: Diagnosis not present

## 2018-07-18 DIAGNOSIS — H5461 Unqualified visual loss, right eye, normal vision left eye: Secondary | ICD-10-CM | POA: Diagnosis present

## 2018-07-18 DIAGNOSIS — K5900 Constipation, unspecified: Secondary | ICD-10-CM | POA: Diagnosis not present

## 2018-07-18 DIAGNOSIS — H16002 Unspecified corneal ulcer, left eye: Secondary | ICD-10-CM | POA: Diagnosis not present

## 2018-07-18 DIAGNOSIS — E109 Type 1 diabetes mellitus without complications: Secondary | ICD-10-CM | POA: Diagnosis not present

## 2018-07-18 DIAGNOSIS — R52 Pain, unspecified: Secondary | ICD-10-CM | POA: Diagnosis not present

## 2018-07-18 DIAGNOSIS — H16012 Central corneal ulcer, left eye: Secondary | ICD-10-CM | POA: Diagnosis not present

## 2018-07-18 DIAGNOSIS — R2232 Localized swelling, mass and lump, left upper limb: Secondary | ICD-10-CM | POA: Diagnosis not present

## 2018-07-18 DIAGNOSIS — Z7409 Other reduced mobility: Secondary | ICD-10-CM | POA: Diagnosis not present

## 2018-07-18 DIAGNOSIS — H44002 Unspecified purulent endophthalmitis, left eye: Secondary | ICD-10-CM | POA: Diagnosis not present

## 2018-07-18 DIAGNOSIS — B965 Pseudomonas (aeruginosa) (mallei) (pseudomallei) as the cause of diseases classified elsewhere: Secondary | ICD-10-CM | POA: Diagnosis not present

## 2018-07-18 DIAGNOSIS — Z794 Long term (current) use of insulin: Secondary | ICD-10-CM | POA: Diagnosis not present

## 2018-07-18 DIAGNOSIS — Z66 Do not resuscitate: Secondary | ICD-10-CM | POA: Diagnosis present

## 2018-07-18 DIAGNOSIS — B9689 Other specified bacterial agents as the cause of diseases classified elsewhere: Secondary | ICD-10-CM | POA: Diagnosis not present

## 2018-07-18 DIAGNOSIS — H5712 Ocular pain, left eye: Secondary | ICD-10-CM | POA: Diagnosis not present

## 2018-07-18 DIAGNOSIS — Z89511 Acquired absence of right leg below knee: Secondary | ICD-10-CM | POA: Diagnosis not present

## 2018-07-18 DIAGNOSIS — Z87891 Personal history of nicotine dependence: Secondary | ICD-10-CM | POA: Diagnosis not present

## 2018-07-18 DIAGNOSIS — H44012 Panophthalmitis (acute), left eye: Secondary | ICD-10-CM | POA: Diagnosis present

## 2018-07-30 DIAGNOSIS — Z79899 Other long term (current) drug therapy: Secondary | ICD-10-CM | POA: Diagnosis not present

## 2018-07-30 DIAGNOSIS — E109 Type 1 diabetes mellitus without complications: Secondary | ICD-10-CM | POA: Diagnosis not present

## 2018-07-30 DIAGNOSIS — Z794 Long term (current) use of insulin: Secondary | ICD-10-CM | POA: Diagnosis not present

## 2018-07-30 DIAGNOSIS — H16002 Unspecified corneal ulcer, left eye: Secondary | ICD-10-CM | POA: Diagnosis not present

## 2018-08-13 DIAGNOSIS — H5440 Blindness, one eye, unspecified eye: Secondary | ICD-10-CM | POA: Diagnosis not present

## 2020-10-06 ENCOUNTER — Telehealth: Payer: Self-pay | Admitting: Orthopedic Surgery

## 2020-10-06 NOTE — Telephone Encounter (Signed)
I called and lm on vm to advise pt that we can not write letter to excuse from jury duty. The court house is wheelchair accessible and he is not having any issues with his prosthetic or residual limb. The last time we saw him in the office was 06/2018. If I can help him with any thing else to please give a call back.

## 2020-10-06 NOTE — Telephone Encounter (Signed)
Received call from pt, he is requesting Dr. Lajoyce Corners write an excuse from jury duty. He is few yrs out R BKA, states he still uses wheelchair and prosthesis. He will be seen again if necessary. Callback [phone], his wife will pick up note if he gets one.

## 2020-10-06 NOTE — Telephone Encounter (Signed)
Received vm from pt needing copy of records. IC,lmvm for pt to [facility].

## 2020-10-13 ENCOUNTER — Ambulatory Visit: Payer: Medicare Other | Admitting: Orthopedic Surgery

## 2020-10-20 ENCOUNTER — Ambulatory Visit: Payer: Medicare Other | Admitting: Orthopedic Surgery

## 2021-03-07 ENCOUNTER — Other Ambulatory Visit: Payer: Self-pay

## 2021-03-07 ENCOUNTER — Emergency Department (HOSPITAL_COMMUNITY): Payer: BC Managed Care – PPO

## 2021-03-07 ENCOUNTER — Inpatient Hospital Stay (HOSPITAL_COMMUNITY)
Admission: EM | Admit: 2021-03-07 | Discharge: 2021-03-11 | DRG: 240 | Disposition: A | Discharge status: Home / Self Care | Payer: BC Managed Care – PPO | Attending: Internal Medicine | Admitting: Internal Medicine

## 2021-03-07 DIAGNOSIS — Z79899 Other long term (current) drug therapy: Secondary | ICD-10-CM

## 2021-03-07 DIAGNOSIS — Z794 Long term (current) use of insulin: Secondary | ICD-10-CM

## 2021-03-07 DIAGNOSIS — E1042 Type 1 diabetes mellitus with diabetic polyneuropathy: Secondary | ICD-10-CM | POA: Diagnosis present

## 2021-03-07 DIAGNOSIS — H548 Legal blindness, as defined in USA: Secondary | ICD-10-CM | POA: Diagnosis present

## 2021-03-07 DIAGNOSIS — I499 Cardiac arrhythmia, unspecified: Secondary | ICD-10-CM | POA: Diagnosis present

## 2021-03-07 DIAGNOSIS — I10 Essential (primary) hypertension: Secondary | ICD-10-CM | POA: Diagnosis present

## 2021-03-07 DIAGNOSIS — Z87891 Personal history of nicotine dependence: Secondary | ICD-10-CM

## 2021-03-07 DIAGNOSIS — E1169 Type 2 diabetes mellitus with other specified complication: Secondary | ICD-10-CM | POA: Diagnosis present

## 2021-03-07 DIAGNOSIS — M86672 Other chronic osteomyelitis, left ankle and foot: Secondary | ICD-10-CM

## 2021-03-07 DIAGNOSIS — N179 Acute kidney failure, unspecified: Secondary | ICD-10-CM | POA: Diagnosis present

## 2021-03-07 DIAGNOSIS — E1065 Type 1 diabetes mellitus with hyperglycemia: Secondary | ICD-10-CM | POA: Diagnosis present

## 2021-03-07 DIAGNOSIS — T1490XA Injury, unspecified, initial encounter: Secondary | ICD-10-CM | POA: Diagnosis present

## 2021-03-07 DIAGNOSIS — Z20822 Contact with and (suspected) exposure to covid-19: Secondary | ICD-10-CM | POA: Diagnosis present

## 2021-03-07 DIAGNOSIS — W208XXA Other cause of strike by thrown, projected or falling object, initial encounter: Secondary | ICD-10-CM | POA: Diagnosis present

## 2021-03-07 DIAGNOSIS — I96 Gangrene, not elsewhere classified: Secondary | ICD-10-CM | POA: Diagnosis present

## 2021-03-07 DIAGNOSIS — M2042 Other hammer toe(s) (acquired), left foot: Secondary | ICD-10-CM | POA: Diagnosis present

## 2021-03-07 DIAGNOSIS — L02612 Cutaneous abscess of left foot: Secondary | ICD-10-CM

## 2021-03-07 DIAGNOSIS — S93115A Dislocation of interphalangeal joint of left lesser toe(s), initial encounter: Secondary | ICD-10-CM | POA: Diagnosis present

## 2021-03-07 DIAGNOSIS — L03116 Cellulitis of left lower limb: Secondary | ICD-10-CM

## 2021-03-07 DIAGNOSIS — M87278 Osteonecrosis due to previous trauma, left toe(s): Secondary | ICD-10-CM | POA: Diagnosis not present

## 2021-03-07 DIAGNOSIS — Z89511 Acquired absence of right leg below knee: Secondary | ICD-10-CM

## 2021-03-07 DIAGNOSIS — W230XXA Caught, crushed, jammed, or pinched between moving objects, initial encounter: Secondary | ICD-10-CM | POA: Diagnosis not present

## 2021-03-07 DIAGNOSIS — E1052 Type 1 diabetes mellitus with diabetic peripheral angiopathy with gangrene: Secondary | ICD-10-CM | POA: Diagnosis present

## 2021-03-07 DIAGNOSIS — E0865 Diabetes mellitus due to underlying condition with hyperglycemia: Secondary | ICD-10-CM

## 2021-03-07 DIAGNOSIS — S99922A Unspecified injury of left foot, initial encounter: Secondary | ICD-10-CM

## 2021-03-07 DIAGNOSIS — Z881 Allergy status to other antibiotic agents status: Secondary | ICD-10-CM

## 2021-03-07 DIAGNOSIS — S93105A Unspecified dislocation of left toe(s), initial encounter: Secondary | ICD-10-CM | POA: Diagnosis not present

## 2021-03-07 LAB — CBC WITH DIFFERENTIAL/PLATELET
Abs Immature Granulocytes: 0.03 10*3/uL (ref 0.00–0.07)
Basophils Absolute: 0 10*3/uL (ref 0.0–0.1)
Basophils Relative: 0 %
Eosinophils Absolute: 0 10*3/uL (ref 0.0–0.5)
Eosinophils Relative: 0 %
HCT: 47.1 % (ref 39.0–52.0)
Hemoglobin: 15.4 g/dL (ref 13.0–17.0)
Immature Granulocytes: 0 %
Lymphocytes Relative: 10 %
Lymphs Abs: 1 10*3/uL (ref 0.7–4.0)
MCH: 30.3 pg (ref 26.0–34.0)
MCHC: 32.7 g/dL (ref 30.0–36.0)
MCV: 92.5 fL (ref 80.0–100.0)
Monocytes Absolute: 0.7 10*3/uL (ref 0.1–1.0)
Monocytes Relative: 7 %
Neutro Abs: 8.2 10*3/uL — ABNORMAL HIGH (ref 1.7–7.7)
Neutrophils Relative %: 83 %
Platelets: 211 10*3/uL (ref 150–400)
RBC: 5.09 MIL/uL (ref 4.22–5.81)
RDW: 13.1 % (ref 11.5–15.5)
WBC: 10 10*3/uL (ref 4.0–10.5)
nRBC: 0 % (ref 0.0–0.2)

## 2021-03-07 LAB — COMPREHENSIVE METABOLIC PANEL
ALT: 29 U/L (ref 0–44)
AST: 25 U/L (ref 15–41)
Albumin: 4.2 g/dL (ref 3.5–5.0)
Alkaline Phosphatase: 59 U/L (ref 38–126)
Anion gap: 8 (ref 5–15)
BUN: 23 mg/dL (ref 8–23)
CO2: 22 mmol/L (ref 22–32)
Calcium: 8.9 mg/dL (ref 8.9–10.3)
Chloride: 104 mmol/L (ref 98–111)
Creatinine, Ser: 1.29 mg/dL — ABNORMAL HIGH (ref 0.61–1.24)
GFR, Estimated: >60 mL/min (ref >60)
Glucose, Bld: 152 mg/dL — ABNORMAL HIGH (ref 70–99)
Potassium: 4.5 mmol/L (ref 3.5–5.1)
Sodium: 134 mmol/L — ABNORMAL LOW (ref 135–145)
Total Bilirubin: 0.7 mg/dL (ref 0.3–1.2)
Total Protein: 7.9 g/dL (ref 6.5–8.1)

## 2021-03-07 LAB — SEDIMENTATION RATE: Sed Rate: 13 mm/hr (ref 0–16)

## 2021-03-07 LAB — CBG MONITORING, ED
Glucose-Capillary: 122 mg/dL — ABNORMAL HIGH (ref 70–99)
Glucose-Capillary: 102 mg/dL — ABNORMAL HIGH (ref 70–99)

## 2021-03-07 LAB — GLUCOSE, CAPILLARY: Glucose-Capillary: 120 mg/dL — ABNORMAL HIGH (ref 70–99)

## 2021-03-07 LAB — HIV ANTIBODY (ROUTINE TESTING W REFLEX): HIV Screen 4th Generation wRfx: NONREACTIVE

## 2021-03-07 MED ORDER — LACTATED RINGERS IV BOLUS
1000.0000 mL | Freq: Once | INTRAVENOUS | Status: AC
  Administered 2021-03-07: 1000 mL via INTRAVENOUS

## 2021-03-07 MED ORDER — INSULIN ASPART 100 UNIT/ML IJ SOLN
0.0000 [IU] | Freq: Three times a day (TID) | INTRAMUSCULAR | Status: DC
  Administered 2021-03-08: 2 [IU] via SUBCUTANEOUS
  Administered 2021-03-08: 3 [IU] via SUBCUTANEOUS
  Administered 2021-03-09 – 2021-03-11 (×4): 2 [IU] via SUBCUTANEOUS

## 2021-03-07 MED ORDER — LIDOCAINE HCL (PF) 1 % IJ SOLN
5.0000 mL | Freq: Once | INTRAMUSCULAR | Status: DC
  Filled 2021-03-07: qty 5

## 2021-03-07 MED ORDER — LOSARTAN POTASSIUM 50 MG PO TABS
50.0000 mg | ORAL_TABLET | Freq: Every day | ORAL | Status: DC
  Administered 2021-03-07 – 2021-03-11 (×5): 50 mg via ORAL
  Filled 2021-03-07 (×5): qty 1

## 2021-03-07 MED ORDER — VANCOMYCIN HCL 1250 MG/250ML IV SOLN
1250.0000 mg | Freq: Once | INTRAVENOUS | Status: AC
  Administered 2021-03-07: 1250 mg via INTRAVENOUS
  Filled 2021-03-07: qty 250

## 2021-03-07 MED ORDER — HYDROCODONE-ACETAMINOPHEN 5-325 MG PO TABS
1.0000 | ORAL_TABLET | ORAL | Status: DC | PRN
Start: 2021-03-07 — End: 2021-03-11
  Filled 2021-03-07: qty 1

## 2021-03-07 MED ORDER — POLYETHYLENE GLYCOL 3350 17 G PO PACK: 17.0000 g | PACK | Freq: Every day | ORAL | Status: DC | PRN

## 2021-03-07 MED ORDER — INSULIN GLARGINE 100 UNIT/ML ~~LOC~~ SOLN
5.0000 [IU] | Freq: Every day | SUBCUTANEOUS | Status: DC
  Administered 2021-03-07 – 2021-03-11 (×4): 5 [IU] via SUBCUTANEOUS
  Filled 2021-03-07 (×5): qty 0.05

## 2021-03-07 MED ORDER — HEPARIN SODIUM (PORCINE) 5000 UNIT/ML IJ SOLN
5000.0000 [IU] | Freq: Three times a day (TID) | INTRAMUSCULAR | Status: DC
  Administered 2021-03-07 – 2021-03-11 (×9): 5000 [IU] via SUBCUTANEOUS
  Filled 2021-03-07 (×9): qty 1

## 2021-03-07 NOTE — H&P (Signed)
Date: 03/07/2021               Patient Name:  Samuel Roth MRN: [id number]  DOB: 1951/02/28 Age / Sex: 70 y.o., male   PCP: Patient, No Pcp Per (Inactive)         Medical Service: Internal Medicine Teaching Service         Attending Physician: Dr. Adela Glimpse    First Contact: Dr. Coy Saunas Pager: [phone]  Second Contact: Court Joy, MD, Dellis Filbert Pager: Barbaraann Cao [phone])       After Hours (After 5p/  First Contact Pager: [phone]  weekends / holidays): Second Contact Pager: [phone]   Chief Complaint: Left foot pain, wound  History of Present Illness: Samuel Roth is a 70 year old gentleman with medical history significant for insulin-dependent diabetes mellitus (type I), status post right BKA, peripheral neuropathy, glaucoma, clinically blind since childhood, status post evisceration presenting with left fifth toe pain.  He states that he was in his usual state of health until last week when he dropped a case of soda on his foot.  He states that initially, he was unaware of the extent of the trauma.  He first noticed that his left toe was dislodged today when he was taking a shower.  He denies pain, fevers, chills, abdominal pain, nausea, vomiting, dizziness, lightheadedness.  He does report that sometimes when he is stressed he would experience palpitations and skipped beats.  Otherwise he denies any other complaints.  His PCP is Dr. Kenton Kingfisher and Dr. Clovia Cuff where he gets his regular checkup. States that his last hemoglobin A1C was <7%.  ED course: Afebrile, HR with range 80s-100s, BP with range 140s-160s/70s-90s, SPO2 99%.  ESR unremarkable, BMP shows sodium 134, glucose 152, creatinine 1.29.  CBC unremarkable.   Lab Orders     Blood culture (routine x 2)     Sedimentation rate     Comprehensive metabolic panel     CBC with Differential     Hemoglobin A1c     TSH     HIV Antibody (routine testing w rflx)     Comprehensive metabolic panel     CBC   Meds:  No current  facility-administered medications on file prior to encounter.   Current Outpatient Medications on File Prior to Encounter  Medication Sig  . brimonidine (ALPHAGAN P) 0.1 % SOLN Place 1 drop into both eyes 3 (three) times daily.  . brinzolamide (AZOPT) 1 % ophthalmic suspension Place 1 drop into the left eye 3 (three) times daily.  . cholecalciferol (VITAMIN D) 1000 units tablet Take 1,000 Units by mouth daily.  Marland Kitchen doxycycline (VIBRA-TABS) 100 MG tablet Take 1 tablet (100 mg total) by mouth 2 (two) times daily.  Marland Kitchen enoxaparin (LOVENOX) 40 MG/0.4ML injection Inject 0.4 mLs (40 mg total) into the skin daily.  . Ginkgo Biloba 100 MG CAPS Take by mouth.  . insulin aspart (NOVOLOG) 100 UNIT/ML injection Correction coverage: Moderate (average weight, post-op)  CBG < 70: implement hypoglycemia protocol  CBG 70 - 120: 0 units  CBG 121 - 150: 2 units  CBG 151 - 200: 3 units  CBG 201 - 250: 5 units  CBG 251 - 300: 8 units  CBG 301 - 350: 11 units  CBG 351 - 400: 15 units  CBG > 400 call MD and obtain STAT lab verification  . insulin glargine (LANTUS) 100 UNIT/ML injection Inject 0.15 mLs (15 Units total) into the skin at bedtime.  . pilocarpine (PILOCAR) 2 %  ophthalmic solution Place 1 drop into the left eye 4 (four) times daily.  . timolol (TIMOPTIC) 0.5 % ophthalmic solution Place 1 drop into the left eye 2 (two) times daily.  . Travoprost, BAK Free, (TRAVATAN Z) 0.004 % SOLN ophthalmic solution Place 1 drop into the left eye daily.     Allergies: Allergies as of 03/07/2021 - Review Complete 06/11/2018  Allergen Reaction Noted  . Ciprofloxacin Hives 02/27/2017   Past Medical History:  Diagnosis Date  . Glaucoma   . Legally blind     Family History: Diabetes mellitus  Social History: Lives with his wife.  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  Last alcohol use was 3 to 4 months ago.  Denies cigarette or illicit drug use.  Has no children or pets  Review of Systems: A complete  ROS was negative except as per HPI.   Physical Exam: Blood pressure (!) 149/73, pulse 83, temperature 98.8 F (37.1 C), temperature source Oral, resp. rate 19, weight 84.8 kg, SpO2 99 %. Physical Exam Vitals and nursing note reviewed.  Eyes:     General: Lids are normal.     Comments: Eyeball absent   Cardiovascular:     Rate and Rhythm: Normal rate. Rhythm irregular.     Heart sounds: No murmur heard.   Pulmonary:     Effort: Pulmonary effort is normal.     Breath sounds: Normal breath sounds. No rales.  Abdominal:     General: Bowel sounds are normal.  Musculoskeletal:        General: No swelling or tenderness.     Cervical back: Neck supple.     Right lower leg: No edema.     Left lower leg: No edema.     Left foot: Swelling and deformity present. No tenderness, bony tenderness or crepitus.     Comments: S/p Right BKA  Skin:    General: Skin is warm.     Findings: Erythema (Left dorsal foot) and signs of injury (Left 5th toe dislodged, dislocated ) present.  Neurological:     Mental Status: He is alert.  Psychiatric:        Mood and Affect: Mood normal.        Behavior: Behavior normal.        EKG: pending   Assessment & Plan by Problem: Principal Problem:   Toe trauma, left, initial encounter Active Problems:   Diabetic polyneuropathy associated with type 1 diabetes mellitus [facility])  Samuel Roth is a 70 year old gentleman with medical history significant for insulin-dependent diabetes mellitus (type I), status post right BKA, peripheral neuropathy, glaucoma, clinically blind since childhood, status post evisceration here for management of left foot trauma   #Left fifth toe trauma and dislocation Initial encounter for left fifth toe trauma with onset several days.  Initially there was concern for overlying cellulitis for which he received 1 dose of vancomycin.  Even though he does have overlying erythema of the dorsal aspect of the left foot however objectively, he  is afebrile, without leukocytosis and inflammatory marker is unremarkable. XR foot does not show evidence of acute cellulitis or osteomyelitis  -Orthopedic surgery to evaluate today -Pain regimen: Norco 5-325 mg 1-2 tablets every 4 as needed -Will defer MRI to orthopedic surgery  -Wound care   #Insulin dependent diabetes mellitus  He tells me his recent A1C was <7% -Home regimen: Sliding scale insulin, Basaglar 8 units daily -Continue Lantus 5 units daily and SSI   #HTN He  states that he was recently started on losartan -Continue losartan 50 mg daily -Follow-up metabolic panel   #Irregular Heart rhythm On auscultation of his heart and palpation of his radial pulses, he was discovered to have irregular rhythm. -Follow-up EKG -Continue cardiac monitoring    FEN: Heart healthy carb modified diet VTE ppx: Subcutaneous heparin CODE STATUS: Full code  Prior to Admission Living Arrangement: Home Anticipated Discharge Location: Pending Barriers to Discharge: Treatment of left fifth toe dislocation and trauma  Dispo: Admit patient to Inpatient with expected length of stay greater than 2 midnights.  Signed: Jean Rosenthal, MD 03/07/2021, 1:00 PM  Pager: [phone] Internal Medicine Teaching Service After 5pm on weekdays and 1pm on weekends: On Call pager: [phone]

## 2021-03-07 NOTE — ED Triage Notes (Signed)
Patient complains of left fifth toe pain after dropping a case of drinks on foot, pain with ambulation

## 2021-03-07 NOTE — Progress Notes (Signed)
Patient brought to 4E from ED. Telemetry box applied, CCMD notified. VSS. Patient states 0/10 pain scale. Call bell in reach. Patient oriented to staff and room.   Kenard Gower, RN

## 2021-03-07 NOTE — ED Notes (Signed)
Lunch tray ordered 

## 2021-03-07 NOTE — ED Provider Notes (Signed)
[facility] EMERGENCY DEPARTMENT Provider Note   CSN: [id number] Arrival date & time: 03/07/21  4920     History No chief complaint on file.   Samuel Roth is a 70 y.o. male.  Patient with uncontrolled diabetes history, states compliance with insulin regimen, clinically blind, history of right lower extremity amputation Dr. Audrie Lia group presents with left foot changes.  Patient not able to see however feels may have injured his left lateral foot.  Patient denies fevers chills, shortness of breath or vomiting.  Patient has seen orthopedics multiple times.        Past Medical History:  Diagnosis Date  . Glaucoma   . Legally blind     Patient Active Problem List   Diagnosis Date Noted  . Acquired absence of right leg below knee (HCC) 03/13/2017  . 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 Diabetes mellitus, new onset (HCC)   . Acute renal failure (HCC)   . Acute kidney injury (HCC) 02/27/2017  . Hyperglycemia 02/27/2017  . Leukocytosis 02/27/2017    Past Surgical History:  Procedure Laterality Date  . AMPUTATION Right 03/01/2017   Procedure: RIGHT TRANS METATARSAL AMPUTATION;  Surgeon: Nadara Mustard, MD;  Location: [facility] OR;  Service: Orthopedics;  Laterality: Right;  . AMPUTATION Right 03/03/2017   Procedure: RIGHT BKA;  Surgeon: Nadara Mustard, MD;  Location: [facility] OR;  Service: Orthopedics;  Laterality: Right;  . EYE SURGERY    . TIBIA FRACTURE SURGERY         No family history on file.  Social History   Tobacco Use  . Smoking status: Former Games developer  . Smokeless tobacco: Former Engineer, water Use Topics  . Alcohol use: Yes    Comment: social  . Drug use: No    Home Medications Prior to Admission medications   Medication Sig Start Date End Date Taking? Authorizing Provider  brimonidine (ALPHAGAN P) 0.1 % SOLN Place 1 drop into both eyes 3 (three) times daily. 12/15/16   [provider]  brinzolamide  (AZOPT) 1 % ophthalmic suspension Place 1 drop into the left eye 3 (three) times daily. 01/01/17   [provider]  cholecalciferol (VITAMIN D) 1000 units tablet Take 1,000 Units by mouth daily.    [provider]  doxycycline (VIBRA-TABS) 100 MG tablet Take 1 tablet (100 mg total) by mouth 2 (two) times daily. 03/26/18   Nadara Mustard, MD  enoxaparin (LOVENOX) 40 MG/0.4ML injection Inject 0.4 mLs (40 mg total) into the skin daily. 03/05/17 03/19/17  Richarda Overlie, MD  Ginkgo Biloba 100 MG CAPS Take by mouth.    [provider]  insulin aspart (NOVOLOG) 100 UNIT/ML injection Correction coverage: Moderate (average weight, post-op)  CBG < 70: implement hypoglycemia protocol  CBG 70 - 120: 0 units  CBG 121 - 150: 2 units  CBG 151 - 200: 3 units  CBG 201 - 250: 5 units  CBG 251 - 300: 8 units  CBG 301 - 350: 11 units  CBG 351 - 400: 15 units  CBG > 400 call MD and obtain STAT lab verification 03/05/17   Richarda Overlie, MD  insulin glargine (LANTUS) 100 UNIT/ML injection Inject 0.15 mLs (15 Units total) into the skin at bedtime. 03/05/17   Richarda Overlie, MD  pilocarpine (PILOCAR) 2 % ophthalmic solution Place 1 drop into the left eye 4 (four) times daily. 01/01/17   [provider]  timolol (TIMOPTIC) 0.5 % ophthalmic  solution Place 1 drop into the left eye 2 (two) times daily. 01/01/17   [provider]  Travoprost, BAK Free, (TRAVATAN Z) 0.004 % SOLN ophthalmic solution Place 1 drop into the left eye daily. 12/15/16   [provider]    Allergies    Ciprofloxacin  Review of Systems   Review of Systems  Constitutional: Negative for chills and fever.  HENT: Negative for congestion.   Eyes: Negative for visual disturbance.  Respiratory: Negative for shortness of breath.   Cardiovascular: Negative for chest pain.  Gastrointestinal: Negative for abdominal pain and vomiting.  Genitourinary: Negative for dysuria and flank pain.  Musculoskeletal:  Negative for back pain, neck pain and neck stiffness.  Skin: Positive for rash and wound.  Neurological: Negative for light-headedness and headaches.    Physical Exam Updated Vital Signs BP (!) 149/73 (BP Location: Left Arm)   Pulse 83   Temp 98.8 F (37.1 C) (Oral)   Resp 19   SpO2 99%   Physical Exam Vitals and nursing note reviewed.  Constitutional:      Appearance: He is well-developed.  HENT:     Head: Normocephalic and atraumatic.  Eyes:     General:        Right eye: No discharge.        Left eye: No discharge.     Conjunctiva/sclera: Conjunctivae normal.  Neck:     Trachea: No tracheal deviation.  Cardiovascular:     Rate and Rhythm: Normal rate.  Pulmonary:     Effort: Pulmonary effort is normal.  Abdominal:     General: There is no distension.     Palpations: Abdomen is soft.     Tenderness: There is no abdominal tenderness. There is no guarding.  Musculoskeletal:        General: Swelling present. No tenderness.     Cervical back: Normal range of motion and neck supple.  Skin:    General: Skin is warm.     Findings: Erythema and rash present.     Comments: Patient with erythema, warmth and odor to left lateral distal dorsal foot with mild gangrene distal small toe which is angulated posteriorly.  Normal cap refill in left foot, intact pulses.  Neurological:     Mental Status: He is alert and oriented to person, place, and time.     Comments: blind  Psychiatric:        Mood and Affect: Mood normal.     ED Results / Procedures / Treatments   Labs (all labs ordered are listed, but only abnormal results are displayed) Labs Reviewed  COMPREHENSIVE METABOLIC PANEL - Abnormal; Notable for the following components:      Result Value   Sodium 134 (*)    Glucose, Bld 152 (*)    Creatinine, Ser 1.29 (*)    All other components within normal limits  CBC WITH DIFFERENTIAL/PLATELET - Abnormal; Notable for the following components:   Neutro Abs 8.2 (*)    All  other components within normal limits  CULTURE, BLOOD (ROUTINE X 2)  CULTURE, BLOOD (ROUTINE X 2)  SEDIMENTATION RATE    EKG None  Radiology DG Foot Complete Left  Result Date: 03/07/2021 CLINICAL DATA:  Dropped heavy object on foot EXAM: LEFT FOOT - COMPLETE 3+ VIEW COMPARISON:  None. FINDINGS: Frontal, oblique, and lateral views were obtained. No acute fracture. There are hammertoe type defects at all levels. There is either severe subluxation or frank dislocation at the fifth PIP joint. No  other findings suggesting frank dislocation. There is narrowing of the first MTP joint as well as all DIP joints. There is a spur along the inferior calcaneus. IMPRESSION: No acute fracture. Suspect dislocation at the fifth PIP joint which may be chronic. Hammertoe deformities present at all levels to varying degrees. There is narrowing of all DIP joints as well as the first MTP joint. There is an inferior calcaneal spur. Electronically Signed   By: Bretta Bang III M.D.   On: 03/07/2021 08:55    Procedures Procedures   Medications Ordered in ED Medications  vancomycin (VANCOCIN) 15 mg/kg in sodium chloride 0.9 % 500 mL IVPB (has no administration in time range)    ED Course  I have reviewed the triage vital signs and the nursing notes.  Pertinent labs & imaging results that were available during my care of the patient were reviewed by me and considered in my medical decision making (see chart for details).    MDM Rules/Calculators/A&P                          Patient presents with uncontrolled diabetes history and clinical concern for cellulitis and possible osteomyelitis.  Blood work obtained showing reassuring inflammatory markers are normal, normal white blood cell count, sodium 134, creatinine 1.29 chronic renal failure. Blood cultures ordered, IV antibiotics ordered.  Discussed with Dr. Lajoyce Corners who will see the patient after admission, paged unassigned medicine for admission. X-ray  reviewed showing deformity, possible dislocation PIP, no definitive osteo-.   Final Clinical Impression(s) / ED Diagnoses Final diagnoses:  Injury  Cellulitis of left foot  Diabetes mellitus due to underlying condition, uncontrolled, with hyperglycemia [facility])    Rx / DC Orders ED Discharge Orders    None       Blane Ohara, MD 03/07/21 1204

## 2021-03-07 NOTE — ED Notes (Signed)
Attempted report x1. 

## 2021-03-08 DIAGNOSIS — W230XXA Caught, crushed, jammed, or pinched between moving objects, initial encounter: Secondary | ICD-10-CM

## 2021-03-08 DIAGNOSIS — L02612 Cutaneous abscess of left foot: Secondary | ICD-10-CM

## 2021-03-08 DIAGNOSIS — E1042 Type 1 diabetes mellitus with diabetic polyneuropathy: Secondary | ICD-10-CM

## 2021-03-08 DIAGNOSIS — L03116 Cellulitis of left lower limb: Secondary | ICD-10-CM | POA: Diagnosis not present

## 2021-03-08 DIAGNOSIS — M86672 Other chronic osteomyelitis, left ankle and foot: Secondary | ICD-10-CM

## 2021-03-08 DIAGNOSIS — S99922A Unspecified injury of left foot, initial encounter: Secondary | ICD-10-CM

## 2021-03-08 DIAGNOSIS — Z794 Long term (current) use of insulin: Secondary | ICD-10-CM

## 2021-03-08 DIAGNOSIS — S93105A Unspecified dislocation of left toe(s), initial encounter: Secondary | ICD-10-CM

## 2021-03-08 DIAGNOSIS — I10 Essential (primary) hypertension: Secondary | ICD-10-CM

## 2021-03-08 LAB — HEMOGLOBIN A1C

## 2021-03-08 LAB — COMPREHENSIVE METABOLIC PANEL
ALT: 24 U/L (ref 0–44)
AST: 22 U/L (ref 15–41)
Albumin: 3.2 g/dL — ABNORMAL LOW (ref 3.5–5.0)
Alkaline Phosphatase: 52 U/L (ref 38–126)
Anion gap: 10 (ref 5–15)
BUN: 18 mg/dL (ref 8–23)
CO2: 21 mmol/L — ABNORMAL LOW (ref 22–32)
Calcium: 8.4 mg/dL — ABNORMAL LOW (ref 8.9–10.3)
Chloride: 106 mmol/L (ref 98–111)
Creatinine, Ser: 1.14 mg/dL (ref 0.61–1.24)
GFR, Estimated: >60 mL/min (ref >60)
Glucose, Bld: 92 mg/dL (ref 70–99)
Potassium: 4.1 mmol/L (ref 3.5–5.1)
Sodium: 137 mmol/L (ref 135–145)
Total Bilirubin: 0.6 mg/dL (ref 0.3–1.2)
Total Protein: 6.1 g/dL — ABNORMAL LOW (ref 6.5–8.1)

## 2021-03-08 LAB — CBC
HCT: 40.9 % (ref 39.0–52.0)
Hemoglobin: 13.8 g/dL (ref 13.0–17.0)
MCH: 30.7 pg (ref 26.0–34.0)
MCHC: 33.7 g/dL (ref 30.0–36.0)
MCV: 90.9 fL (ref 80.0–100.0)
Platelets: 191 10*3/uL (ref 150–400)
RBC: 4.5 MIL/uL (ref 4.22–5.81)
RDW: 13 % (ref 11.5–15.5)
WBC: 6.8 10*3/uL (ref 4.0–10.5)
nRBC: 0 % (ref 0.0–0.2)

## 2021-03-08 LAB — GLUCOSE, CAPILLARY
Glucose-Capillary: 106 mg/dL — ABNORMAL HIGH (ref 70–99)
Glucose-Capillary: 133 mg/dL — ABNORMAL HIGH (ref 70–99)
Glucose-Capillary: 178 mg/dL — ABNORMAL HIGH (ref 70–99)
Glucose-Capillary: 102 mg/dL — ABNORMAL HIGH (ref 70–99)

## 2021-03-08 LAB — SARS CORONAVIRUS 2 (TAT 6-24 HRS): SARS Coronavirus 2: NEGATIVE

## 2021-03-08 MED ORDER — CEFAZOLIN SODIUM-DEXTROSE 2-4 GM/100ML-% IV SOLN
2.0000 g | INTRAVENOUS | Status: AC
  Filled 2021-03-08: qty 100

## 2021-03-08 NOTE — H&P (View-Only) (Signed)
ORTHOPAEDIC CONSULTATION  REQUESTING PHYSICIAN: Inez Catalina, MD  Chief Complaint: Purulent abscess and swelling left little toe.Marland Kitchen  HPI: Samuel Roth is a 70 y.o. male who presents with a dislocated necrotic left little toe secondary to dropping he is on his feet.  Past Medical History:  Diagnosis Date  . Glaucoma   . Legally blind    Past Surgical History:  Procedure Laterality Date  . AMPUTATION Right 03/01/2017   Procedure: RIGHT TRANS METATARSAL AMPUTATION;  Surgeon: Nadara Mustard, MD;  Location: [facility] OR;  Service: Orthopedics;  Laterality: Right;  . AMPUTATION Right 03/03/2017   Procedure: RIGHT BKA;  Surgeon: Nadara Mustard, MD;  Location: [facility] OR;  Service: Orthopedics;  Laterality: Right;  . EYE SURGERY    . TIBIA FRACTURE SURGERY     Social History   Socioeconomic History  . Marital status: Married    Spouse name: Not on file  . Number of children: Not on file  . Years of education: Not on file  . Highest education level: Not on file  Occupational History  . Not on file  Tobacco Use  . Smoking status: Former Games developer  . Smokeless tobacco: Former Engineer, water and Sexual Activity  . Alcohol use: Yes    Comment: social  . Drug use: No  . Sexual activity: Not on file  Other Topics Concern  . Not on file  Social History Narrative  . Not on file   Social Determinants of Health   Financial Resource Strain: Not on file  Food Insecurity: Not on file  Transportation Needs: Not on file  Physical Activity: Not on file  Stress: Not on file  Social Connections: Not on file   No family history on file. - negative except otherwise stated in the family history section Allergies  Allergen Reactions  . Ciprofloxacin Hives   Prior to Admission medications   Medication Sig Start Date End Date Taking? Authorizing Provider  Artificial Tear Solution (GENTEAL TEARS OP) Place 1 drop into both eyes daily as needed (For dry eyes).   Yes [provider]   insulin aspart (NOVOLOG) 100 UNIT/ML injection Correction coverage: Moderate (average weight, post-op)  CBG < 70: implement hypoglycemia protocol  CBG 70 - 120: 0 units  CBG 121 - 150: 2 units  CBG 151 - 200: 3 units  CBG 201 - 250: 5 units  CBG 251 - 300: 8 units  CBG 301 - 350: 11 units  CBG 351 - 400: 15 units  CBG > 400 call MD and obtain STAT lab verification Patient taking differently: Inject 2-15 Units into the skin See admin instructions. Correction coverage: Moderate (average weight, post-op)  CBG < 70: implement hypoglycemia protocol  CBG 70 - 120: 0 units  CBG 121 - 150: 2 units  CBG 151 - 200: 3 units  CBG 201 - 250: 5 units  CBG 251 - 300: 8 units  CBG 301 - 350: 11 units  CBG 351 - 400: 15 units  CBG > 400 call MD and obtain STAT lab verification 03/05/17  Yes Richarda Overlie, MD  insulin glargine (LANTUS) 100 UNIT/ML injection Inject 0.15 mLs (15 Units total) into the skin at bedtime. Patient taking differently: Inject 8 Units into the skin at bedtime. 03/05/17  Yes Richarda Overlie, MD  losartan (COZAAR) 50 MG tablet Take 1 tablet by mouth daily. 02/13/21  Yes [provider]  enoxaparin (LOVENOX) 40 MG/0.4ML injection Inject 0.4 mLs (40 mg total)  into the skin daily. 03/05/17 03/19/17  Abrol, Nayana, MD   DG Foot Complete Left  Result Date: 03/07/2021 CLINICAL DATA:  Dropped heavy object on foot EXAM: LEFT FOOT - COMPLETE 3+ VIEW COMPARISON:  None. FINDINGS: Frontal, oblique, and lateral views were obtained. No acute fracture. There are hammertoe type defects at all levels. There is either severe subluxation or frank dislocation at the fifth PIP joint. No other findings suggesting frank dislocation. There is narrowing of the first MTP joint as well as all DIP joints. There is a spur along the inferior calcaneus. IMPRESSION: No acute fracture. Suspect dislocation at the fifth PIP joint which may be chronic. Hammertoe deformities present at all levels to varying degrees.  There is narrowing of all DIP joints as well as the first MTP joint. There is an inferior calcaneal spur. Electronically Signed   By: William  Woodruff III M.D.   On: 03/07/2021 08:55   - pertinent xrays, CT, MRI studies were reviewed and independently interpreted  Positive ROS: All other systems have been reviewed and were otherwise negative with the exception of those mentioned in the HPI and as above.  Physical Exam: General: Alert, no acute distress Psychiatric: Patient is competent for consent with normal mood and affect Lymphatic: No axillary or cervical lymphadenopathy Cardiovascular: No pedal edema Respiratory: No cyanosis, no use of accessory musculature GI: No organomegaly, abdomen is soft and non-tender    Images:  @ENCIMAGES@  Labs:  Lab Results  Component Value Date   HGBA1C QNSTST 03/07/2021   HGBA1C 9.5 (H) 02/28/2017   ESRSEDRATE 13 03/07/2021   CRP 29.1 (H) 03/01/2017   REPTSTATUS PENDING 03/07/2021   CULT  03/07/2021    NO GROWTH < 24 HOURS Performed at [facility], [address] [place], [place] [address]     Lab Results  Component Value Date   ALBUMIN 3.2 (L) 03/08/2021   ALBUMIN 4.2 03/07/2021   ALBUMIN 1.7 (L) 03/05/2017     CBC EXTENDED Latest Ref Rng & Units 03/08/2021 03/07/2021 03/04/2017  WBC 4.0 - 10.5 K/uL 6.8 10.0 11.3(H)  RBC 4.22 - 5.81 MIL/uL 4.50 5.09 3.97(L)  HGB 13.0 - 17.0 g/dL 13.8 15.4 12.1(L)  HCT 39.0 - 52.0 % 40.9 47.1 35.4(L)  PLT 150 - 400 K/uL 191 211 281  NEUTROABS 1.7 - 7.7 K/uL - 8.2(H) -  LYMPHSABS 0.7 - 4.0 K/uL - 1.0 -    Neurologic: Patient does not have protective sensation bilateral lower extremities.   MUSCULOSKELETAL:    Examination patient has a palpable dorsalis pedis and posterior tibial pulse he has a necrotic dislocated left little toe.  There is no ascending cellulitis.  Patient's hemoglobin A1c most recently was 9.5 current value is pending.   Assessment: Necrotic dislocated left little  toe.    Plan: Patient would like to proceed with amputation of the left little toe on Friday.  Patient states would like to be discharged to home.  I feel with patient's durable medical equipment and home discharge to home should be safe.  Risk and benefits were discussed including risk of the wound not healing.  Patient states he understands wished to proceed at this time.   Thank you for the consult and the opportunity to see Mr. Debski  Chico Cawood, MD [facility] [phone] 12:58 PM     

## 2021-03-08 NOTE — Progress Notes (Signed)
Informed consent order for amputation states "left great toe amputation", which is incorrect. Notified PA with [facility] office of this. Awaiting new order.  Kenard Gower, RN

## 2021-03-08 NOTE — Progress Notes (Signed)
Subjective:   Hospital day: 1  Overnight event: No acute events overnight.  This morning, patient was evaluated on the bedside resting comfortably in bed. He denied any leg pain, fevers, palpitations or chest pain.  Patient was informed of our plans to return to Dr. Lajoyce Corners to come evaluate him and make recommendations for his foot. Per patient, he has everything set up at home and does not want to go to a nursing home.  Objective:  Vital signs in last 24 hours: Vitals:   03/07/21 2323 03/08/21 0326 03/08/21 0852 03/08/21 1120  BP: (!) 148/85 123/75 138/75 131/78  Pulse: 75 70 82 78  Resp: 17 13 16 18   Temp: 98.3 F (36.8 C) 98.3 F (36.8 C) 98.2 F (36.8 C) 98.4 F (36.9 C)  TempSrc: Oral Oral Oral Oral  SpO2: 99% 97% 95% 97%  Weight:        Filed Weights   03/07/21 1200 03/07/21 1820  Weight: 84.8 kg 79.5 kg     Intake/Output Summary (Last 24 hours) at 03/08/2021 0607 Last data filed at 03/08/2021 0330 Gross per 24 hour  Intake 740 ml  Output 1400 ml  Net -660 ml   Net IO Since Admission: -660 mL [03/08/21 0607]  Recent Labs    03/07/21 1711 03/07/21 2026 03/08/21 0600  GLUCAP 102* 120* 106*     Pertinent Labs: CBC Latest Ref Rng & Units 03/08/2021 03/07/2021 03/04/2017  WBC 4.0 - 10.5 K/uL 6.8 10.0 11.3(H)  Hemoglobin 13.0 - 17.0 g/dL 03/06/2017 29.5 12.1(L)  Hematocrit 39.0 - 52.0 % 40.9 47.1 35.4(L)  Platelets 150 - 400 K/uL 191 211 281    CMP Latest Ref Rng & Units 03/08/2021 03/07/2021 03/06/2017  Glucose 70 - 99 mg/dL 92 03/08/2017) -  BUN 8 - 23 mg/dL 18 23 -  Creatinine 416(S - 1.24 mg/dL 0.63 0.16) 0.10(X  Sodium 135 - 145 mmol/L 137 134(L) -  Potassium 3.5 - 5.1 mmol/L 4.1 4.5 -  Chloride 98 - 111 mmol/L 106 104 -  CO2 22 - 32 mmol/L 21(L) 22 -  Calcium 8.9 - 10.3 mg/dL 3.23) 8.9 -  Total Protein 6.5 - 8.1 g/dL 6.1(L) 7.9 -  Total Bilirubin 0.3 - 1.2 mg/dL 0.6 0.7 -  Alkaline Phos 38 - 126 U/L 52 59 -  AST 15 - 41 U/L 22 25 -  ALT 0 - 44 U/L 24 29 -     Imaging: DG Foot Complete Left  Result Date: 03/07/2021 CLINICAL DATA:  Dropped heavy object on foot EXAM: LEFT FOOT - COMPLETE 3+ VIEW COMPARISON:  None. FINDINGS: Frontal, oblique, and lateral views were obtained. No acute fracture. There are hammertoe type defects at all levels. There is either severe subluxation or frank dislocation at the fifth PIP joint. No other findings suggesting frank dislocation. There is narrowing of the first MTP joint as well as all DIP joints. There is a spur along the inferior calcaneus. IMPRESSION: No acute fracture. Suspect dislocation at the fifth PIP joint which may be chronic. Hammertoe deformities present at all levels to varying degrees. There is narrowing of all DIP joints as well as the first MTP joint. There is an inferior calcaneal spur. Electronically Signed   By: 03/09/2021 III M.D.   On: 03/07/2021 08:55    Physical Exam  General: Pleasant, well-appearing elderly male laying in bed.  NAD. Head: Normocephalic. Atraumatic. Eyes: Absent eyeballs. Eyes covered with sunglasses. CV: RRR. No m/r/g. No LE edema Pulmonary: Lungs CTAB.  Normal effort. No wheezing or rales. Abdominal: Soft, nontender, nondistended. Normal bowel sounds. MSK: Status post right BKA. Left foot covered with wrap/bandage Extremities: 2+ distal pulses. Normal ROM.  Skin: Warm and dry. No obvious rash or lesions. Neuro: A&Ox3. No focal deficits.  Psych: Normal mood and affect  Assessment/Plan: Samuel Roth is a 70 y.o. male with hx of IDDM, status post right BKA, peripheral neuropathy, glaucoma, clinically blind since childhood who presented for left foot trauma found to have left fifth toe dislocation on x-ray.  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 Patient has hammertoe  deformities of various degree but no acute fracture.  Patient continues to be afebrile without leukocytosis so low suspicion for cellulitis or osteomyelitis. -- Pending evaluation by Dr. Lajoyce Corners today --Consider PT/OT consult after eval by Ortho --Continue Norco 5-325 mg 1 to 2 tab q4h prn --Continue wound care  Insulin-dependent diabetes mellitus Diabetic polyneuropathy A1c < 7% per patient. Patient on SSI and Basaglar 8 units daily at home.  CBG 100-120s since admission. --Pending repeat A1c --Continue Lantus 5 units daily -- SSI moderate -- CBG monitoring  Hypertension Stable. BP improved to systolic 120s to 161W.  No electrolyte abnormalities on CMP. --Continue losartan 50 mg daily --Daily vitals  Diet: Heart healthy/carb modified IVF: None VTE: Subcu heparin CODE: Full code  Prior to Admission Living Arrangement: Home Anticipated Discharge Location: Home Barriers to Discharge: Further management of left fifth toe dislocation Dispo: Anticipated discharge in approximately 1-2 day(s).   Signed: Steffanie Rainwater, MD 03/08/2021, 6:07 AM  Pager: [phone] Internal Medicine Teaching Service After 5pm on weekdays and 1pm on weekends: On Call pager: [phone]

## 2021-03-08 NOTE — Consult Note (Addendum)
ORTHOPAEDIC CONSULTATION  REQUESTING PHYSICIAN: Inez Catalina, MD  Chief Complaint: Purulent abscess and swelling left little toe.Marland Kitchen  HPI: Samuel Roth is a 70 y.o. male who presents with a dislocated necrotic left little toe secondary to dropping he is on his feet.  Past Medical History:  Diagnosis Date  . Glaucoma   . Legally blind    Past Surgical History:  Procedure Laterality Date  . AMPUTATION Right 03/01/2017   Procedure: RIGHT TRANS METATARSAL AMPUTATION;  Surgeon: Nadara Mustard, MD;  Location: [facility] OR;  Service: Orthopedics;  Laterality: Right;  . AMPUTATION Right 03/03/2017   Procedure: RIGHT BKA;  Surgeon: Nadara Mustard, MD;  Location: [facility] OR;  Service: Orthopedics;  Laterality: Right;  . EYE SURGERY    . TIBIA FRACTURE SURGERY     Social History   Socioeconomic History  . Marital status: Married    Spouse name: Not on file  . Number of children: Not on file  . Years of education: Not on file  . Highest education level: Not on file  Occupational History  . Not on file  Tobacco Use  . Smoking status: Former Games developer  . Smokeless tobacco: Former Engineer, water and Sexual Activity  . Alcohol use: Yes    Comment: social  . Drug use: No  . Sexual activity: Not on file  Other Topics Concern  . Not on file  Social History Narrative  . Not on file   Social Determinants of Health   Financial Resource Strain: Not on file  Food Insecurity: Not on file  Transportation Needs: Not on file  Physical Activity: Not on file  Stress: Not on file  Social Connections: Not on file   No family history on file. - negative except otherwise stated in the family history section Allergies  Allergen Reactions  . Ciprofloxacin Hives   Prior to Admission medications   Medication Sig Start Date End Date Taking? Authorizing Provider  Artificial Tear Solution (GENTEAL TEARS OP) Place 1 drop into both eyes daily as needed (For dry eyes).   Yes [provider]   insulin aspart (NOVOLOG) 100 UNIT/ML injection Correction coverage: Moderate (average weight, post-op)  CBG < 70: implement hypoglycemia protocol  CBG 70 - 120: 0 units  CBG 121 - 150: 2 units  CBG 151 - 200: 3 units  CBG 201 - 250: 5 units  CBG 251 - 300: 8 units  CBG 301 - 350: 11 units  CBG 351 - 400: 15 units  CBG > 400 call MD and obtain STAT lab verification Patient taking differently: Inject 2-15 Units into the skin See admin instructions. Correction coverage: Moderate (average weight, post-op)  CBG < 70: implement hypoglycemia protocol  CBG 70 - 120: 0 units  CBG 121 - 150: 2 units  CBG 151 - 200: 3 units  CBG 201 - 250: 5 units  CBG 251 - 300: 8 units  CBG 301 - 350: 11 units  CBG 351 - 400: 15 units  CBG > 400 call MD and obtain STAT lab verification 03/05/17  Yes Richarda Overlie, MD  insulin glargine (LANTUS) 100 UNIT/ML injection Inject 0.15 mLs (15 Units total) into the skin at bedtime. Patient taking differently: Inject 8 Units into the skin at bedtime. 03/05/17  Yes Richarda Overlie, MD  losartan (COZAAR) 50 MG tablet Take 1 tablet by mouth daily. 02/13/21  Yes [provider]  enoxaparin (LOVENOX) 40 MG/0.4ML injection Inject 0.4 mLs (40 mg total)  into the skin daily. 03/05/17 03/19/17  Richarda Overlie, MD   DG Foot Complete Left  Result Date: 03/07/2021 CLINICAL DATA:  Dropped heavy object on foot EXAM: LEFT FOOT - COMPLETE 3+ VIEW COMPARISON:  None. FINDINGS: Frontal, oblique, and lateral views were obtained. No acute fracture. There are hammertoe type defects at all levels. There is either severe subluxation or frank dislocation at the fifth PIP joint. No other findings suggesting frank dislocation. There is narrowing of the first MTP joint as well as all DIP joints. There is a spur along the inferior calcaneus. IMPRESSION: No acute fracture. Suspect dislocation at the fifth PIP joint which may be chronic. Hammertoe deformities present at all levels to varying degrees.  There is narrowing of all DIP joints as well as the first MTP joint. There is an inferior calcaneal spur. Electronically Signed   By: Bretta Bang III M.D.   On: 03/07/2021 08:55   - pertinent xrays, CT, MRI studies were reviewed and independently interpreted  Positive ROS: All other systems have been reviewed and were otherwise negative with the exception of those mentioned in the HPI and as above.  Physical Exam: General: Alert, no acute distress Psychiatric: Patient is competent for consent with normal mood and affect Lymphatic: No axillary or cervical lymphadenopathy Cardiovascular: No pedal edema Respiratory: No cyanosis, no use of accessory musculature GI: No organomegaly, abdomen is soft and non-tender    Images:  @ENCIMAGES @  Labs:  Lab Results  Component Value Date   HGBA1C QNSTST 03/07/2021   HGBA1C 9.5 (H) 02/28/2017   ESRSEDRATE 13 03/07/2021   CRP 29.1 (H) 03/01/2017   REPTSTATUS PENDING 03/07/2021   CULT  03/07/2021    NO GROWTH < 24 HOURS Performed at [facility], [address] [place], [place]     Lab Results  Component Value Date   ALBUMIN 3.2 (L) 03/08/2021   ALBUMIN 4.2 03/07/2021   ALBUMIN 1.7 (L) 03/05/2017     CBC EXTENDED Latest Ref Rng & Units 03/08/2021 03/07/2021 03/04/2017  WBC 4.0 - 10.5 K/uL 6.8 10.0 11.3(H)  RBC 4.22 - 5.81 MIL/uL 4.50 5.09 3.97(L)  HGB 13.0 - 17.0 g/dL 03/06/2017 66.5 12.1(L)  HCT 39.0 - 52.0 % 40.9 47.1 35.4(L)  PLT 150 - 400 K/uL 191 211 281  NEUTROABS 1.7 - 7.7 K/uL - 8.2(H) -  LYMPHSABS 0.7 - 4.0 K/uL - 1.0 -    Neurologic: Patient does not have protective sensation bilateral lower extremities.   MUSCULOSKELETAL:    Examination patient has a palpable dorsalis pedis and posterior tibial pulse he has a necrotic dislocated left little toe.  There is no ascending cellulitis.  Patient's hemoglobin A1c most recently was 9.5 current value is pending.   Assessment: Necrotic dislocated left little  toe.    Plan: Patient would like to proceed with amputation of the left little toe on Friday.  Patient states would like to be discharged to home.  I feel with patient's durable medical equipment and home discharge to home should be safe.  Risk and benefits were discussed including risk of the wound not healing.  Patient states he understands wished to proceed at this time.   Thank you for the consult and the opportunity to see Mr. Duard Spiewak, MD [facility] [phone] 12:58 PM

## 2021-03-08 NOTE — Progress Notes (Signed)
  Date: 03/08/2021  Patient name: Samuel Roth  Medical record number: [id number]  Date of birth: 07-25-1951   I have seen and evaluated Samuel Roth and discussed their care with the Residency Team. Briefly, Samuel Roth is a 70 year old man with PMH of IDDM, Right BKA, glaucoma, blindness who presented with left toe pain, likely due to trauma either from dropping a heavy object on it or from working out.  He does not have much sensation in the toe and did not remember it being uncomfortable.  He first noticed the change in the toe on the day of admission when taking a shower.  He has had no signs/symptoms of infection.  His xray of the foot noted that the dislocation could be chronic.   Of note, patient with multiple inappropriate comments during our exam, including noting that he could "pick up" this physician (when discussing his strength), but that "you are out of my league."  Also noted to have "fantasized about having a menage a trois, but not like this" when resident and myself were auscultating his heart.  He was informed that this type of commentary crossed a line and should not be done again. He apologized.    Vitals:   03/08/21 0852 03/08/21 1120  BP: 138/75 131/78  Pulse: 82 78  Resp: 16 18  Temp: 98.2 F (36.8 C) 98.4 F (36.9 C)  SpO2: 95% 97%   Gen: Awake, alert, conversant Eyes: Wearing eye shades, cataract present on the right, missing globe on the left HENT: Neck is supple, MMM CV: RR, NR, no murmur noted Pulm: Clear, no wheezing, normal work of breathing Abd: Soft, +BS MSK: He is s/p right BKA.  He has a misaligned 5th toe with surrounding erythema on the left Skin: erythema around 5th digit on the left, otherwise no rash on exposed skin Psych: Talkative, mostly appropriate in thought process.   Assessment and Plan: I have seen and evaluated the patient as outlined above. I agree with the formulated Assessment and Plan as detailed in the residents' note, with the following  changes:   Necrotic dislocated toe - We are awaiting surgical evaluation.  Will likely need an amputation  All other issues are chronic and controlled, his home medications will be restarted as possible.  Other issues per Samuel Roth note.   Inez Catalina, MD 6/1/20223:11 PM

## 2021-03-09 DIAGNOSIS — L02612 Cutaneous abscess of left foot: Secondary | ICD-10-CM

## 2021-03-09 DIAGNOSIS — M87278 Osteonecrosis due to previous trauma, left toe(s): Secondary | ICD-10-CM

## 2021-03-09 DIAGNOSIS — M86672 Other chronic osteomyelitis, left ankle and foot: Secondary | ICD-10-CM

## 2021-03-09 LAB — GLUCOSE, CAPILLARY
Glucose-Capillary: 108 mg/dL — ABNORMAL HIGH (ref 70–99)
Glucose-Capillary: 133 mg/dL — ABNORMAL HIGH (ref 70–99)
Glucose-Capillary: 139 mg/dL — ABNORMAL HIGH (ref 70–99)
Glucose-Capillary: 93 mg/dL (ref 70–99)

## 2021-03-09 LAB — HEMOGLOBIN A1C
Hgb A1c MFr Bld: 6.2 % — ABNORMAL HIGH (ref 4.8–5.6)
Mean Plasma Glucose: 131 mg/dL

## 2021-03-09 NOTE — Progress Notes (Signed)
Subjective:   Hospital day: 2  Overnight event: No acute events overnight  Patient was evaluated at the bedside laying comfortably in bed with spouse in the room. Patient denies any fevers, foot pain, shortness of breath or palpitations. Patient states he finally remembers dropping a 10-lb weight on his left foot which is likely the cause of his left fifth digit dislocation. States he qualifies for home health at home reemphasized the need to discharge home and not to a SNF.  Objective:  Vital signs in last 24 hours: Vitals:   03/08/21 1655 03/08/21 2033 03/08/21 2326 03/09/21 0529  BP: 134/76 115/62 114/71 129/75  Pulse: 74 76 63 69  Resp: 16 19 17 16   Temp: 98 F (36.7 C) 98.3 F (36.8 C) 98.1 F (36.7 C) 97.8 F (36.6 C)  TempSrc: Oral Oral Oral Oral  SpO2: 97% 98% 98% 95%  Weight:        Filed Weights   03/07/21 1200 03/07/21 1820  Weight: 84.8 kg 79.5 kg     Intake/Output Summary (Last 24 hours) at 03/09/2021 0744 Last data filed at 03/09/2021 0500 Gross per 24 hour  Intake 740 ml  Output 850 ml  Net -110 ml   Net IO Since Admission: -770 mL [03/09/21 0744]  Recent Labs    03/08/21 1653 03/08/21 2039 03/09/21 0635  GLUCAP 178* 102* 108*     Pertinent Labs: CBC Latest Ref Rng & Units 03/08/2021 03/07/2021 03/04/2017  WBC 4.0 - 10.5 K/uL 6.8 10.0 11.3(H)  Hemoglobin 13.0 - 17.0 g/dL 03/06/2017 16.1 12.1(L)  Hematocrit 39.0 - 52.0 % 40.9 47.1 35.4(L)  Platelets 150 - 400 K/uL 191 211 281    CMP Latest Ref Rng & Units 03/08/2021 03/07/2021 03/06/2017  Glucose 70 - 99 mg/dL 92 03/08/2017) -  BUN 8 - 23 mg/dL 18 23 -  Creatinine 045(W - 1.24 mg/dL 0.98 1.19) 1.47(W  Sodium 135 - 145 mmol/L 137 134(L) -  Potassium 3.5 - 5.1 mmol/L 4.1 4.5 -  Chloride 98 - 111 mmol/L 106 104 -  CO2 22 - 32 mmol/L 21(L) 22 -  Calcium 8.9 - 10.3 mg/dL 2.95) 8.9 -  Total Protein 6.5 - 8.1 g/dL 6.1(L) 7.9 -  Total Bilirubin 0.3 - 1.2 mg/dL 0.6 0.7 -  Alkaline Phos 38 - 126 U/L 52 59 -  AST  15 - 41 U/L 22 25 -  ALT 0 - 44 U/L 24 29 -    Imaging: No results found.  Physical Exam  General:  Well-appearing elderly male laying in bed with sunglasses on.  No acute distress. Head: Normocephalic. Atraumatic. CV: RRR. No M/R/G. No lower extremity edema. Pulmonary: Lungs CTAB. No wheezing or rales.  Abdominal: Soft. Nontender, nondistended.  Active bowel sounds. MSK: S/p Right BKA.  Left foot with anterior displacement of the fifth digit with surrounding erythema but no tenderness to palpation.  Neurovascularly intact. Extremities: 2+ distal pulses. Normal ROM. Skin:  Warm and dry. Mild erythema around left fifth digit Neuro:  A&O x3. Decreased sensation to the left foot.  Assessment/Plan: Samuel Roth is a 70 y.o. male with hx of IDDM, status post right BKA, peripheral neuropathy, glaucoma, clinically blind since childhood who presented for left foot trauma found to have left fifth toe dislocation on x-ray.  Pending left fifth digit amputation by Dr. 78 on Friday 6/3  Principal Problem:   Toe trauma, left, initial encounter Active Problems:   Diabetic polyneuropathy associated with type 1 diabetes mellitus (HCC)  Chronic osteomyelitis of toe of left foot (HCC)   Abscess of left great toe   Cellulitis of left foot  Necrotic dislocated left little toe Seen by Dr. Lajoyce Corners yesterday with plans for left fifth toe amputation tomorrow morning. Patient continues to deny any left fifth digit pain.  Remains afebrile.  States he does not want any narcotics unless his pain is very severe after surgery. --Planned amputation of left little toe on Friday by Dr. Lajoyce Corners --PT/OT consult after surgery --Morning CBC --Continue Norco 5-325 mg 1-2 tabs q4h prn --Continue wound care  Insulin-dependent diabetes mellitus Diabetic polyneuropathy A1c of 6.2% during this admission. Patient on SSI and Basaglar 8 units daily at home. Patient CBG of 108, 102 and 178.  --Continue Lantus 5 units  daily --SSI moderate --CBG monitoring  Hypertension BP remains well controlled on current regimen. Systolic BP in the 110s to 120s.  Stable kidney function. --To losartan 50 mg daily --Daily vitals -- BMP tomorrow  Diet: Heart healthy/carb modified IVF: None VTE: Subcu heparin CODE: Full code  Prior to Admission Living Arrangement: Home Anticipated Discharge Location: Home Barriers to Discharge: Further management of left fifth toe dislocation Dispo: Anticipated discharge in approximately 1-2 day(s).   Signed: Steffanie Rainwater, MD 03/09/2021, 7:44 AM  Pager: [phone] Internal Medicine Teaching Service After 5pm on weekdays and 1pm on weekends: On Call pager: [phone]

## 2021-03-10 ENCOUNTER — Encounter (HOSPITAL_COMMUNITY): Payer: Self-pay | Admitting: Internal Medicine

## 2021-03-10 ENCOUNTER — Encounter (HOSPITAL_COMMUNITY)
Admission: EM | Disposition: A | Discharge status: Home / Self Care | Payer: Self-pay | Source: Home / Self Care | Attending: Internal Medicine

## 2021-03-10 ENCOUNTER — Inpatient Hospital Stay (HOSPITAL_COMMUNITY): Payer: BC Managed Care – PPO | Admitting: Certified Registered"

## 2021-03-10 DIAGNOSIS — S99922A Unspecified injury of left foot, initial encounter: Secondary | ICD-10-CM

## 2021-03-10 DIAGNOSIS — L02612 Cutaneous abscess of left foot: Secondary | ICD-10-CM | POA: Diagnosis not present

## 2021-03-10 DIAGNOSIS — N179 Acute kidney failure, unspecified: Secondary | ICD-10-CM

## 2021-03-10 DIAGNOSIS — M86672 Other chronic osteomyelitis, left ankle and foot: Secondary | ICD-10-CM | POA: Diagnosis not present

## 2021-03-10 HISTORY — PX: AMPUTATION TOE: SHX6595

## 2021-03-10 LAB — CBC
HCT: 44.2 % (ref 39.0–52.0)
Hemoglobin: 14.8 g/dL (ref 13.0–17.0)
MCH: 30.6 pg (ref 26.0–34.0)
MCHC: 33.5 g/dL (ref 30.0–36.0)
MCV: 91.3 fL (ref 80.0–100.0)
Platelets: 186 10*3/uL (ref 150–400)
RBC: 4.84 MIL/uL (ref 4.22–5.81)
RDW: 12.9 % (ref 11.5–15.5)
WBC: 5 10*3/uL (ref 4.0–10.5)
nRBC: 0 % (ref 0.0–0.2)

## 2021-03-10 LAB — COMPREHENSIVE METABOLIC PANEL
ALT: 24 U/L (ref 0–44)
AST: 21 U/L (ref 15–41)
Albumin: 3.3 g/dL — ABNORMAL LOW (ref 3.5–5.0)
Alkaline Phosphatase: 55 U/L (ref 38–126)
Anion gap: 6 (ref 5–15)
BUN: 23 mg/dL (ref 8–23)
CO2: 25 mmol/L (ref 22–32)
Calcium: 8.6 mg/dL — ABNORMAL LOW (ref 8.9–10.3)
Chloride: 107 mmol/L (ref 98–111)
Creatinine, Ser: 1.38 mg/dL — ABNORMAL HIGH (ref 0.61–1.24)
GFR, Estimated: 55 mL/min — ABNORMAL LOW (ref >60)
Glucose, Bld: 103 mg/dL — ABNORMAL HIGH (ref 70–99)
Potassium: 4.4 mmol/L (ref 3.5–5.1)
Sodium: 138 mmol/L (ref 135–145)
Total Bilirubin: 0.6 mg/dL (ref 0.3–1.2)
Total Protein: 6.7 g/dL (ref 6.5–8.1)

## 2021-03-10 LAB — GLUCOSE, CAPILLARY
Glucose-Capillary: 92 mg/dL (ref 70–99)
Glucose-Capillary: 94 mg/dL (ref 70–99)
Glucose-Capillary: 141 mg/dL — ABNORMAL HIGH (ref 70–99)
Glucose-Capillary: 117 mg/dL — ABNORMAL HIGH (ref 70–99)

## 2021-03-10 LAB — SURGICAL PCR SCREEN
MRSA, PCR: NEGATIVE
Staphylococcus aureus: NEGATIVE

## 2021-03-10 SURGERY — AMPUTATION, TOE
Anesthesia: General | Site: Toe | Laterality: Left

## 2021-03-10 MED ORDER — OXYCODONE HCL 5 MG PO TABS: 5.0000 mg | ORAL_TABLET | ORAL | Status: DC | PRN

## 2021-03-10 MED ORDER — CEFAZOLIN SODIUM-DEXTROSE 2-4 GM/100ML-% IV SOLN
2.0000 g | Freq: Three times a day (TID) | INTRAVENOUS | Status: AC
  Administered 2021-03-10 – 2021-03-11 (×2): 2 g via INTRAVENOUS
  Filled 2021-03-10 (×2): qty 100

## 2021-03-10 MED ORDER — CEFAZOLIN SODIUM-DEXTROSE 1-4 GM/50ML-% IV SOLN
INTRAVENOUS | Status: AC
  Filled 2021-03-10: qty 100

## 2021-03-10 MED ORDER — FENTANYL CITRATE (PF) 100 MCG/2ML IJ SOLN
INTRAMUSCULAR | Status: DC | PRN
  Administered 2021-03-10: 50 ug via INTRAVENOUS

## 2021-03-10 MED ORDER — BUPIVACAINE HCL (PF) 0.25 % IJ SOLN
INTRAMUSCULAR | Status: AC
  Filled 2021-03-10: qty 30

## 2021-03-10 MED ORDER — FENTANYL CITRATE (PF) 100 MCG/2ML IJ SOLN: 25.0000 ug | INTRAMUSCULAR | Status: DC | PRN

## 2021-03-10 MED ORDER — MIDAZOLAM HCL 2 MG/2ML IJ SOLN
INTRAMUSCULAR | Status: AC
  Filled 2021-03-10: qty 2

## 2021-03-10 MED ORDER — ONDANSETRON HCL 4 MG/2ML IJ SOLN
INTRAMUSCULAR | Status: AC
  Filled 2021-03-10: qty 2

## 2021-03-10 MED ORDER — GUAIFENESIN-DM 100-10 MG/5ML PO SYRP: 15.0000 mL | ORAL_SOLUTION | ORAL | Status: DC | PRN

## 2021-03-10 MED ORDER — LACTATED RINGERS IV SOLN: INTRAVENOUS | Status: DC

## 2021-03-10 MED ORDER — CEFAZOLIN SODIUM-DEXTROSE 2-3 GM-%(50ML) IV SOLR
INTRAVENOUS | Status: DC | PRN
  Administered 2021-03-10: 2 g via INTRAVENOUS

## 2021-03-10 MED ORDER — ONDANSETRON HCL 4 MG/2ML IJ SOLN
INTRAMUSCULAR | Status: DC | PRN
  Administered 2021-03-10: 4 mg via INTRAVENOUS

## 2021-03-10 MED ORDER — CHLORHEXIDINE GLUCONATE 0.12 % MT SOLN
15.0000 mL | OROMUCOSAL | Status: AC
  Administered 2021-03-10: 15 mL via OROMUCOSAL
  Filled 2021-03-10 (×2): qty 15

## 2021-03-10 MED ORDER — HYDROMORPHONE HCL 1 MG/ML IJ SOLN: 0.5000 mg | INTRAMUSCULAR | Status: DC | PRN

## 2021-03-10 MED ORDER — PHENOL 1.4 % MT LIQD: 1.0000 | OROMUCOSAL | Status: DC | PRN

## 2021-03-10 MED ORDER — MIDAZOLAM HCL 5 MG/5ML IJ SOLN
INTRAMUSCULAR | Status: DC | PRN
  Administered 2021-03-10 (×2): 1 mg via INTRAVENOUS

## 2021-03-10 MED ORDER — LIDOCAINE 2% (20 MG/ML) 5 ML SYRINGE
INTRAMUSCULAR | Status: DC | PRN
  Administered 2021-03-10: 60 mg via INTRAVENOUS

## 2021-03-10 MED ORDER — LIDOCAINE 2% (20 MG/ML) 5 ML SYRINGE
INTRAMUSCULAR | Status: AC
  Filled 2021-03-10: qty 5

## 2021-03-10 MED ORDER — PROPOFOL 10 MG/ML IV BOLUS
INTRAVENOUS | Status: AC
  Filled 2021-03-10: qty 20

## 2021-03-10 MED ORDER — PROPOFOL 10 MG/ML IV BOLUS
INTRAVENOUS | Status: DC | PRN
  Administered 2021-03-10: 100 mg via INTRAVENOUS

## 2021-03-10 MED ORDER — BUPIVACAINE HCL 0.25 % IJ SOLN
INTRAMUSCULAR | Status: DC | PRN
  Administered 2021-03-10: 20 mL

## 2021-03-10 MED ORDER — ASCORBIC ACID 500 MG PO TABS
1000.0000 mg | ORAL_TABLET | Freq: Every day | ORAL | Status: DC
  Administered 2021-03-11: 1000 mg via ORAL
  Filled 2021-03-10: qty 2

## 2021-03-10 MED ORDER — FENTANYL CITRATE (PF) 250 MCG/5ML IJ SOLN
INTRAMUSCULAR | Status: AC
  Filled 2021-03-10: qty 5

## 2021-03-10 MED ORDER — EPHEDRINE SULFATE-NACL 50-0.9 MG/10ML-% IV SOSY
PREFILLED_SYRINGE | INTRAVENOUS | Status: DC | PRN
  Administered 2021-03-10 (×2): 5 mg via INTRAVENOUS

## 2021-03-10 MED ORDER — ZINC SULFATE 220 (50 ZN) MG PO CAPS
220.0000 mg | ORAL_CAPSULE | Freq: Every day | ORAL | Status: DC
  Administered 2021-03-11: 220 mg via ORAL
  Filled 2021-03-10: qty 1

## 2021-03-10 MED ORDER — JUVEN PO PACK
1.0000 | PACK | Freq: Two times a day (BID) | ORAL | Status: DC
  Filled 2021-03-10: qty 1

## 2021-03-10 MED ORDER — FENTANYL CITRATE (PF) 100 MCG/2ML IJ SOLN
INTRAMUSCULAR | Status: AC
  Filled 2021-03-10: qty 2

## 2021-03-10 MED ORDER — PHENYLEPHRINE 40 MCG/ML (10ML) SYRINGE FOR IV PUSH (FOR BLOOD PRESSURE SUPPORT)
PREFILLED_SYRINGE | INTRAVENOUS | Status: DC | PRN
  Administered 2021-03-10 (×3): 80 ug via INTRAVENOUS

## 2021-03-10 MED ORDER — SODIUM CHLORIDE 0.9 % IV SOLN: INTRAVENOUS | Status: DC

## 2021-03-10 MED ORDER — ACETAMINOPHEN 325 MG PO TABS
325.0000 mg | ORAL_TABLET | Freq: Four times a day (QID) | ORAL | Status: DC | PRN
Start: 2021-03-11 — End: 2021-03-11

## 2021-03-10 MED ORDER — 0.9 % SODIUM CHLORIDE (POUR BTL) OPTIME
TOPICAL | Status: DC | PRN
  Administered 2021-03-10: 1000 mL

## 2021-03-10 MED ORDER — ONDANSETRON HCL 4 MG/2ML IJ SOLN: 4.0000 mg | Freq: Once | INTRAMUSCULAR | Status: DC | PRN

## 2021-03-10 SURGICAL SUPPLY — 29 items
BLADE SURG 21 STRL SS (BLADE) ×2 IMPLANT
BNDG COHESIVE 4X5 TAN STRL (GAUZE/BANDAGES/DRESSINGS) ×2 IMPLANT
BNDG ESMARK 4X9 LF (GAUZE/BANDAGES/DRESSINGS) IMPLANT
BNDG GAUZE ELAST 4 BULKY (GAUZE/BANDAGES/DRESSINGS) ×2 IMPLANT
COVER SURGICAL LIGHT HANDLE (MISCELLANEOUS) ×4 IMPLANT
COVER WAND RF STERILE (DRAPES) ×2 IMPLANT
DRAPE U-SHAPE 47X51 STRL (DRAPES) ×2 IMPLANT
DRSG ADAPTIC 3X8 NADH LF (GAUZE/BANDAGES/DRESSINGS) ×1 IMPLANT
DRSG PAD ABDOMINAL 8X10 ST (GAUZE/BANDAGES/DRESSINGS) ×2 IMPLANT
DURAPREP 26ML APPLICATOR (WOUND CARE) ×2 IMPLANT
ELECT REM PT RETURN 9FT ADLT (ELECTROSURGICAL) ×2
ELECTRODE REM PT RTRN 9FT ADLT (ELECTROSURGICAL) ×1 IMPLANT
GAUZE SPONGE 4X4 12PLY STRL (GAUZE/BANDAGES/DRESSINGS) IMPLANT
GLOVE BIOGEL PI IND STRL 9 (GLOVE) ×1 IMPLANT
GLOVE BIOGEL PI INDICATOR 9 (GLOVE) ×1
GLOVE SURG ORTHO 9.0 STRL STRW (GLOVE) ×2 IMPLANT
GOWN STRL REUS W/ TWL XL LVL3 (GOWN DISPOSABLE) ×2 IMPLANT
GOWN STRL REUS W/TWL XL LVL3 (GOWN DISPOSABLE) ×2
KIT BASIN OR (CUSTOM PROCEDURE TRAY) ×2 IMPLANT
KIT TURNOVER KIT B (KITS) ×2 IMPLANT
MANIFOLD NEPTUNE II (INSTRUMENTS) ×2 IMPLANT
NEEDLE 22X1 1/2 (OR ONLY) (NEEDLE) IMPLANT
NS IRRIG 1000ML POUR BTL (IV SOLUTION) ×2 IMPLANT
PACK ORTHO EXTREMITY (CUSTOM PROCEDURE TRAY) ×2 IMPLANT
PAD ABD 8X10 STRL (GAUZE/BANDAGES/DRESSINGS) ×1 IMPLANT
PAD ARMBOARD 7.5X6 YLW CONV (MISCELLANEOUS) ×4 IMPLANT
SUT ETHILON 2 0 PSLX (SUTURE) ×2 IMPLANT
SYR CONTROL 10ML LL (SYRINGE) IMPLANT
TOWEL GREEN STERILE (TOWEL DISPOSABLE) ×2 IMPLANT

## 2021-03-10 NOTE — Interval H&P Note (Signed)
History and Physical Interval Note:  03/10/2021 7:00 AM  Samuel Roth  has presented today for surgery, with the diagnosis of Abscess/Osteomylitis Left LittleToe..  The various methods of treatment have been discussed with the patient and family. After consideration of risks, benefits and other options for treatment, the patient has consented to  Procedure(s): AMPUTATION LEFT LITTLE TOE (Left) as a surgical intervention.  The patient's history has been reviewed, patient examined, no change in status, stable for surgery.  I have reviewed the patient's chart and labs.  Questions were answered to the patient's satisfaction.     Nadara Mustard

## 2021-03-10 NOTE — Transfer of Care (Signed)
Immediate Anesthesia Transfer of Care Note  Patient: Samuel Roth  Procedure(s) Performed: AMPUTATION LEFT LITTLE TOE (Left Toe)  Patient Location: PACU  Anesthesia Type:General  Level of Consciousness: drowsy  Airway & Oxygen Therapy: Patient Spontanous Breathing and Patient connected to face mask oxygen  Post-op Assessment: Report given to RN and Post -op Vital signs reviewed and stable  Post vital signs: Reviewed and stable  Last Vitals:  Vitals Value Taken Time  BP 87/52 03/10/21 1300  Temp    Pulse 80 03/10/21 1302  Resp 14 03/10/21 1302  SpO2 96 % 03/10/21 1302  Vitals shown include unvalidated device data.  Last Pain:  Vitals:   03/10/21 1147  TempSrc: Oral  PainSc:          Complications: No complications documented.

## 2021-03-10 NOTE — Progress Notes (Signed)
Subjective:   Hospital day: 3  Overnight event: No acute events overnight.  Patient was evaluated at the bedside laying comfortably in bed.  Patient has no complaints this morning and denies any pain.  Patient anxious about surgery.  He was reassured this is not a major surgery and he is expected to survive the surgery.  Patient amendable to getting PT/OT eval after surgery but does not want to go to a SNF.  Objective:  Vital signs in last 24 hours: Vitals:   03/09/21 1650 03/09/21 1922 03/09/21 2344 03/10/21 0341  BP: 134/72 114/73 124/73 112/74  Pulse: 76  64 72  Resp: 16 13 16 20   Temp: 98 F (36.7 C) 98.1 F (36.7 C) 98.5 F (36.9 C) 98.1 F (36.7 C)  TempSrc: Oral Oral Oral Oral  SpO2: 98% 98% 99% 99%  Weight:    79.5 kg    Filed Weights   03/07/21 1200 03/07/21 1820 03/10/21 0341  Weight: 84.8 kg 79.5 kg 79.5 kg     Intake/Output Summary (Last 24 hours) at 03/10/2021 0640 Last data filed at 03/10/2021 0029 Gross per 24 hour  Intake --  Output 995 ml  Net -995 ml   Net IO Since Admission: -1,765 mL [03/10/21 0640]  Recent Labs    03/09/21 1648 03/09/21 2146 03/10/21 0619  GLUCAP 139* 93 92     Pertinent Labs: CBC Latest Ref Rng & Units 03/10/2021 03/08/2021 03/07/2021  WBC 4.0 - 10.5 K/uL 5.0 6.8 10.0  Hemoglobin 13.0 - 17.0 g/dL 03/09/2021 68.0 88.1  Hematocrit 39.0 - 52.0 % 44.2 40.9 47.1  Platelets 150 - 400 K/uL 186 191 211    CMP Latest Ref Rng & Units 03/10/2021 03/08/2021 03/07/2021  Glucose 70 - 99 mg/dL 03/09/2021) 92 159(Y)  BUN 8 - 23 mg/dL 23 18 23   Creatinine 0.61 - 1.24 mg/dL 585(F) 2.92(K)  Sodium 135 - 145 mmol/L 138 137 134(L)  Potassium 3.5 - 5.1 mmol/L 4.4 4.1 4.5  Chloride 98 - 111 mmol/L 107 106 104  CO2 22 - 32 mmol/L 25 21(L) 22  Calcium 8.9 - 10.3 mg/dL 4.62) 8.63(O) 8.9  Total Protein 6.5 - 8.1 g/dL 6.7 6.1(L) 7.9  Total Bilirubin 0.3 - 1.2 mg/dL 0.6 0.6 0.7  Alkaline Phos 38 - 126 U/L 55 52 59  AST 15 - 41 U/L 21 22 25   ALT 0 - 44  U/L 24 24 29     Imaging: No results found.  Physical Exam  General: Well-appearing blind elderly man laying comfortably in bed. NAD.  CV: RRR. No m/r/g. No LE edema. Pulmonary: Lungs coarse to auscultation bilaterally. Normal effort. Abdominal:  Soft. NT/ND. Normal bowel sounds. MSK: S/p right BKA.  Left foot with anterior displacement of the fifth digit with some dried blood and erythema. Neurovascularly intact distally. Extremities: Palpable distal pulses. Skin: Warm and dry.  Mild erythema around left fifth digit  Assessment/Plan: Samuel Roth is a 70 y.o. male with hx of IDDM, status post right BKA, peripheral neuropathy, glaucoma, clinically blind since childhood who presented for left foot trauma found to have left fifth toe dislocation on x-ray. Pending left fifth digit amputation today.  Principal Problem:   Toe trauma, left, initial encounter Active Problems:   Diabetic polyneuropathy associated with type 1 diabetes mellitus (HCC)   Chronic osteomyelitis of toe of left foot (HCC)   Abscess of left great toe   Cellulitis of left foot  Necrotic dislocated left little toe Patient continues to  deny pain in the left foot. A little anxious about surgery today. Scheduled for left fifth digit amputation today by Dr. Lajoyce Corners. Remains afebrile. CBC completely normal. --Surgery this morning --PT eval --Continue Norco 5-325 mg 1-2 tablets q4h prn --Continue wound care.  Insulin-dependent diabetes mellitus Diabetic polyneuropathy A1c of 6.2% during this admission. Patient on SSI and Basaglar 8 units daily at home. Recent CBG of 139->93->92->94.  --Continue Lantus 5 units daily --SSI moderate --Continue CBG monitoring  Hypertension Stable. BP remains well controlled. Systolic BP in the 110s to 120s. --Continue losartan 50 mg daily --Daily vitals  Mild AKI Patient initially presented with AKI with creatinine 1.29 which resolved. Creatinine now with a slight bump to 1.38. No  electrolyte abnormalities. Likely secondary to dehydration in the setting of decreased p.o. intake during hospitalization. Net I&O of -2 L since admission. --IV fluid resuscitation after surgery --Daily BMP  Diet: Heart healthy/carb modified IVF: None VTE: Subcu heparin CODE: Full code  Prior to Admission Living Arrangement: Home Anticipated Discharge Location: Home Barriers to Discharge: Further management of left fifth toe dislocation Dispo: Anticipated discharge in approximately 1-2 day(s).   Signed: Steffanie Rainwater, MD 03/10/2021, 6:40 AM  Pager: [phone] Internal Medicine Teaching Service After 5pm on weekdays and 1pm on weekends: On Call pager: [phone]

## 2021-03-10 NOTE — Anesthesia Procedure Notes (Signed)
Procedure Name: LMA Insertion Date/Time: 03/10/2021 12:31 PM Performed by: Marny Lowenstein, CRNA Pre-anesthesia Checklist: Patient identified, Emergency Drugs available, Suction available and Patient being monitored Patient Re-evaluated:Patient Re-evaluated prior to induction Oxygen Delivery Method: Circle system utilized Preoxygenation: Pre-oxygenation with 100% oxygen Induction Type: IV induction Ventilation: Mask ventilation without difficulty LMA: LMA inserted LMA Size: 4.0 Number of attempts: 1 Placement Confirmation: positive ETCO2 and breath sounds checked- equal and bilateral Tube secured with: Tape Dental Injury: Teeth and Oropharynx as per pre-operative assessment

## 2021-03-10 NOTE — Op Note (Signed)
03/10/2021  12:56 PM  PATIENT:  Samuel Roth    PRE-OPERATIVE DIAGNOSIS:  Abscess/Osteomylitis Left LittleToe.  POST-OPERATIVE DIAGNOSIS:  Same  PROCEDURE:  AMPUTATION LEFT LITTLE TOE  SURGEON:  Nadara Mustard, MD  PHYSICIAN ASSISTANT:None ANESTHESIA:   General  PREOPERATIVE INDICATIONS:  Samuel Roth is a  70 y.o. male with a diagnosis of Abscess/Osteomylitis Left LittleToe. who failed conservative measures and elected for surgical management.    The risks benefits and alternatives were discussed with the patient preoperatively including but not limited to the risks of infection, bleeding, nerve injury, cardiopulmonary complications, the need for revision surgery, among others, and the patient was willing to proceed.  OPERATIVE IMPLANTS: None  @ENCIMAGES @  OPERATIVE FINDINGS: Good petechial bleeding no abscess margins were clear.  OPERATIVE PROCEDURE: Patient brought the operating room and underwent general anesthetic.  After adequate anesthesia were obtained  Main anesthesia left lower extremity was prepped the main using directutilization of field a timeout was called.  Patient underwent  .  Open aorta local anesthetic with 1 cc quarter percent Marcaine plain.  A racquet incision was made around the necrotic her deficiency was carried down to nonenhancing metatarsals Mente.  The ray amputation was midshaft of the metatarsal.  We got possibility of TAVR plantarly TAVR right knee wound was debrided irrigated electrocautery was used hemostasis the margins were clear.  The incision was closed using 2-0 nylon a sterile dressing was applied patient was extubated taken the PACU in stable condition.   DISCHARGE PLANNING:  Antibiotic duration: 24-hour IV antibiotics  Weightbearing: Touchdown weightbearing on the left  Pain medication: Opioid pathway  Dressing care/ Wound VAC: Dry dressing change as needed  Ambulatory devices: Walker  Discharge to: Anticipate discharge to  home.  Follow-up: In the office 1 week post operative.

## 2021-03-10 NOTE — Anesthesia Preprocedure Evaluation (Signed)
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   Neuro/Psych Diabetic polyneuropathy Glaucoma Legally blind  Neuromuscular disease negative psych ROS   GI/Hepatic negative GI ROS, Neg liver ROS,   Endo/Other  diabetes, Well Controlled, Type 2, Insulin DependentHyperlipidemia  Renal/GU Renal InsufficiencyRenal disease  negative genitourinary   Musculoskeletal Osteomyelitis left 5th toe S/P Right BKA   Abdominal   Peds  Hematology negative hematology ROS (+)   Anesthesia Other Findings   Reproductive/Obstetrics                             Anesthesia Physical Anesthesia Plan  ASA: III  Anesthesia Plan: General   Post-op Pain Management:    Induction: Intravenous  PONV Risk Score and Plan: 3 and Treatment may vary due to age or medical condition and Ondansetron  Airway Management Planned: LMA  Additional Equipment:   Intra-op Plan:   Post-operative Plan: Extubation in OR  Informed Consent: I have reviewed the patients History and Physical, chart, labs and discussed the procedure including the risks, benefits and alternatives for the proposed anesthesia with the patient or authorized representative who has indicated his/her understanding and acceptance.     Dental advisory given  Plan Discussed with: CRNA and  Anesthesiologist  Anesthesia Plan Comments:         Anesthesia Quick Evaluation

## 2021-03-10 NOTE — Anesthesia Postprocedure Evaluation (Signed)
Anesthesia Post Note  Patient: Samuel Roth  Procedure(s) Performed: AMPUTATION LEFT LITTLE TOE (Left Toe)     Patient location during evaluation: PACU Anesthesia Type: General Level of consciousness: awake and alert and oriented Pain management: pain level controlled Vital Signs Assessment: post-procedure vital signs reviewed and stable Respiratory status: spontaneous breathing, nonlabored ventilation and respiratory function stable Cardiovascular status: blood pressure returned to baseline and stable Postop Assessment: no apparent nausea or vomiting Anesthetic complications: no   No complications documented.  Last Vitals:  Vitals:   03/10/21 1315 03/10/21 1330  BP: 116/61 125/64  Pulse: 88 86  Resp: 13 16  Temp:  (!) 36.2 C  SpO2: 96% 96%    Last Pain:  Vitals:   03/10/21 1330  TempSrc:   PainSc: 0-No pain                 Daytona Retana A.

## 2021-03-11 ENCOUNTER — Encounter (HOSPITAL_COMMUNITY): Payer: Self-pay | Admitting: Orthopedic Surgery

## 2021-03-11 DIAGNOSIS — S99922A Unspecified injury of left foot, initial encounter: Secondary | ICD-10-CM

## 2021-03-11 LAB — BASIC METABOLIC PANEL
Anion gap: 9 (ref 5–15)
BUN: 18 mg/dL (ref 8–23)
CO2: 23 mmol/L (ref 22–32)
Calcium: 8.3 mg/dL — ABNORMAL LOW (ref 8.9–10.3)
Chloride: 103 mmol/L (ref 98–111)
Creatinine, Ser: 1.28 mg/dL — ABNORMAL HIGH (ref 0.61–1.24)
GFR, Estimated: >60 mL/min (ref >60)
Glucose, Bld: 124 mg/dL — ABNORMAL HIGH (ref 70–99)
Potassium: 4.4 mmol/L (ref 3.5–5.1)
Sodium: 135 mmol/L (ref 135–145)

## 2021-03-11 LAB — GLUCOSE, CAPILLARY
Glucose-Capillary: 133 mg/dL — ABNORMAL HIGH (ref 70–99)
Glucose-Capillary: 122 mg/dL — ABNORMAL HIGH (ref 70–99)

## 2021-03-11 LAB — CBC
HCT: 44.5 % (ref 39.0–52.0)
Hemoglobin: 15 g/dL (ref 13.0–17.0)
MCH: 30.6 pg (ref 26.0–34.0)
MCHC: 33.7 g/dL (ref 30.0–36.0)
MCV: 90.8 fL (ref 80.0–100.0)
Platelets: 157 10*3/uL (ref 150–400)
RBC: 4.9 MIL/uL (ref 4.22–5.81)
RDW: 13.2 % (ref 11.5–15.5)
WBC: 8.7 10*3/uL (ref 4.0–10.5)
nRBC: 0 % (ref 0.0–0.2)

## 2021-03-11 MED ORDER — HYDROCODONE-ACETAMINOPHEN 5-325 MG PO TABS: 1.0000 | ORAL_TABLET | Freq: Four times a day (QID) | ORAL | 0 refills | Status: AC | PRN

## 2021-03-11 MED ORDER — POLYETHYLENE GLYCOL 3350 17 G PO PACK: 17.0000 g | PACK | Freq: Every day | ORAL | 0 refills | Status: DC | PRN

## 2021-03-11 NOTE — Discharge Instructions (Signed)
Mr. Spraggins,   It was a pleasure taking care of you at [facility]. You were admitted for your toe trauma and had your left fifth toe amputated. We are discharging you home now that you are doing better. Please follow up with Dr. Audrie Lia office next week. Take the prescribed pain medications as needed for pain with the Miralax.   Take care,  Dr. Sharrell Ku, MD, MPH

## 2021-03-11 NOTE — Progress Notes (Signed)
Patient ID: Samuel Roth, male   DOB: Dec 19, 1950, 70 y.o.   MRN: [id number] Patient is postoperative day 1 left foot fifth ray amputation he has no complaints the dressing is dry.  Anticipate patient can discharge to home today from an orthopedic standpoint.  I will follow-up in the office in 1 week.

## 2021-03-11 NOTE — TOC Transition Note (Signed)
Transition of Care [facility]) - CM/SW Discharge Note   Patient Details  Name: Samuel Roth MRN: [id number] Date of Birth: 07-01-1951  [facility] CM/SW Contact:  Bess Kinds, RN Phone Number: [phone] 03/11/2021, 11:42 AM   Clinical Narrative:     Spoke with patient on his hospital room phone. Discussed recommendations for home health physical therapy - "I don't think I need that. I'm very independent." Has needed DME - wheelchair, walker, rollator. PCP is Dr. Tiburcio Pea, but stated Dr. Darleene Cleaver does home visits. Patient to transition home today. No TOC needs identified at this time.   Final next level of care: Home/Self Care Barriers to Discharge: No Barriers Identified   Patient Goals and CMS Choice Patient states their goals for this hospitalization and ongoing recovery are:: return home CMS Medicare.gov Compare Post Acute Care list provided to:: Patient Choice offered to / list presented to : Patient  Discharge Placement                       Discharge Plan and Services                DME Arranged: N/A DME Agency: NA       HH Arranged: Refused HH HH Agency: NA        Social Determinants of Health (SDOH) Interventions     Readmission Risk Interventions No flowsheet data found.

## 2021-03-11 NOTE — Discharge Summary (Signed)
Name: HASKEL DEWALT MRN: [id number] DOB: 1951/06/15 70 y.o. PCP: Patient, No Pcp Per (Inactive)  Date of Admission: 03/07/2021  8:21 AM Date of Discharge: 03/11/2021 Attending Physician: Inez Catalina, MD  Discharge Diagnosis: 1. Left fifth digit amputation 2. Left fifth digit trauma  Discharge Medications: Allergies as of 03/11/2021      Reactions   Ciprofloxacin Hives      Medication List    STOP taking these medications   enoxaparin 40 MG/0.4ML injection Commonly known as: LOVENOX     TAKE these medications   GENTEAL TEARS OP Place 1 drop into both eyes daily as needed (For dry eyes).   HYDROcodone-acetaminophen 5-325 MG tablet Commonly known as: NORCO/VICODIN Take 1 tablet by mouth every 6 (six) hours as needed for up to 5 days for moderate pain.   insulin aspart 100 UNIT/ML injection Commonly known as: novoLOG Correction coverage: Moderate (average weight, post-op)  CBG < 70: implement hypoglycemia protocol  CBG 70 - 120: 0 units  CBG 121 - 150: 2 units  CBG 151 - 200: 3 units  CBG 201 - 250: 5 units  CBG 251 - 300: 8 units  CBG 301 - 350: 11 units  CBG 351 - 400: 15 units  CBG > 400 call MD and obtain STAT lab verification What changed:   how much to take  how to take this  when to take this   insulin glargine 100 UNIT/ML injection Commonly known as: LANTUS Inject 0.15 mLs (15 Units total) into the skin at bedtime. What changed: how much to take   losartan 50 MG tablet Commonly known as: COZAAR Take 1 tablet by mouth daily.   polyethylene glycol 17 g packet Commonly known as: MIRALAX / GLYCOLAX Take 17 g by mouth daily as needed for mild constipation.            Durable Medical Equipment  (From admission, onward)         Start     Ordered   03/11/21 0608  For home use only DME Walker  Once       Question:  Patient needs a walker to treat with the following condition  Answer:  Amputation of fifth toe, left, traumatic (HCC)   03/11/21  0608           Discharge Care Instructions  (From admission, onward)         Start     Ordered   03/11/21 0000  Touch down weight bearing       Question Answer Comment  Laterality left   Extremity Lower      03/11/21 0916          Disposition and follow-up:   Mr.Aydn T Holycross was discharged from [facility] in Stable condition.  At the hospital follow up visit please address:  1.  Left fifth toe amputation: He had successful amputation of left little toe on 03/10/2021 with Dr. Lajoyce Corners. Ensure he follows up with Dr. Audrie Lia office in 1 week.   2. AKI: Presented in mild AKI that is resolving after IV fluids. Repeat BMP to ensure this has completely resolved.   3.  Labs / imaging needed at time of follow-up: BMP  4.  Pending labs/ test needing follow-up: None  Follow-up Appointments:  Follow-up Information    Persons, [place], [place] In 1 week.   Specialty: Orthopedic Surgery Contact information: [address] [place] [address] [phone]  Hospital Course by problem list: 1. Left fifth toe trauma and dislocation: Patient presented with left fifth toe trauma with onset several days. Initially there was concern for overlying cellulitis for which he received 1 dose of vancomycin.  Even though he had overlying erythema around the left fifth toe, he remained afebrile, without leukocytosis and inflammatory marker were unremarkable. XR foot showed dislocation at the fifth PIP joint but no fracture or signs of infection. Orthopedic surgery evaluated patient and patient had amputation of his left little toe by Dr. Lajoyce Corners on 03/10/2021.   2. AKI: Patient presented with mild AKI with a creatinine of 1.29. It initially resolved but trended up to 1.38 due to poor po intake. Creatinine improved to 1.28 on day of discharge after IV fluids were given. He will need a repeat BMP to ensure it has completely resolved.   3. Hypertension: Patient's blood  pressure remained well-controlled during admission. He was continued on his losartan 50 mg daily.   4. Diabetes: His blood sugar was well-controlled with CBG range of 90s to 141 while on Lantus 5 units daily with sliding scale insulin. He was discharged on his home regimen.   Subjective Patient was evaluated at the bedside on his phone. Patient states he is ready for discharge. He denies any leg pain, fevers, chills or dizziness. Patient states he is waiting for foot booth before discharge. Patient was reminded to call Dr. Audrie Lia office next week for follow-up appointment.   Discharge Exam:   BP 115/60 (BP Location: Right Arm)   Pulse 63   Temp 98.1 F (36.7 C) (Oral)   Resp 18   Wt 79.5 kg   SpO2 97%   BMI 25.88 kg/m    General:Well-appearing blind elderly man with shades on laying comfortably in bed. NAD.  CV: RRR. No m/r/g. No LE edema. Pulmonary: Lungs coarse to auscultation bilaterally. Normal effort. Abdominal: Soft. NT/ND. Normal bowel sounds. MSK: S/p right BKA. LLE covered with dressing. Neurovascularly intact distally.  Extremities: Palpable distal pulses.  Skin: Warm and dry.  Mild erythema around left fifth digit  Pertinent Labs, Studies, and Procedures:  CBC Latest Ref Rng & Units 03/11/2021 03/10/2021 03/08/2021  WBC 4.0 - 10.5 K/uL 8.7 5.0 6.8  Hemoglobin 13.0 - 17.0 g/dL 38.7 56.4 33.2  Hematocrit 39.0 - 52.0 % 44.5 44.2 40.9  Platelets 150 - 400 K/uL 157 186 191   BMP Latest Ref Rng & Units 03/11/2021 03/10/2021 03/08/2021  Glucose 70 - 99 mg/dL 951(O) 841(Y) 92  BUN 8 - 23 mg/dL 18 23 18   Creatinine 0.61 - 1.24 mg/dL ) 6.06(T) 0.16(W  Sodium 135 - 145 mmol/L 135 138 137  Potassium 3.5 - 5.1 mmol/L 4.4 4.4 4.1  Chloride 98 - 111 mmol/L 103 107 106  CO2 22 - 32 mmol/L 23 25 21(L)  Calcium 8.9 - 10.3 mg/dL 8.3(L) 8.6(L) 8.4(L)    Discharge Instructions: Mr. Loh,   It was a pleasure taking care of you at [facility]. You were admitted for your toe  trauma and had your left fifth toe amputated. We are discharging you home now that you are doing better. Please follow up with Dr. [facility] office next week. Take the prescribed pain medications as needed for pain with the Miralax.   Take care,  Dr. Audrie Lia, MD, MPH  Signed: Sharrell Ku, MD 03/11/2021, 6:15 AM   Pager: [phone]

## 2021-03-11 NOTE — Evaluation (Signed)
Physical Therapy Evaluation Patient Details Name: Samuel Roth MRN: [id number] DOB: 1951/01/23 Today's Date: 03/11/2021   History of Present Illness  71 yo admitted 5/31 with left little toe dislocation and osteomyelitis s/p amputation on 6/3. PMHx: glaucoma, legally blind, Rt BKA, DM, HTN  Clinical Impression  Pt pleasant and extremely talkative. Pt reports 24hr assist at home with all necessary DME. Pt able to demonstrate basic transfers and pericare without assist. Pt has flight of stairs to his apartment and reports plan to scoot up stairs on his bottom and strongly declines option of ambulance transport and very clear he has no intention of ever being in a SNF unless fully disabled. Pt moving well and demonstrates understanding of need to allow foot to heel and maintain weight off LLE. Pt with decreased activity tolerance and function who will benefit from acute therapy to maximize mobility. HHPT to assess stairs and safety at home.     Follow Up Recommendations Home health PT    Equipment Recommendations  None recommended by PT    Recommendations for Other Services       Precautions / Restrictions Precautions Precautions: Fall Restrictions LLE Weight Bearing: Touchdown weight bearing      Mobility  Bed Mobility Overal bed mobility: Modified Independent                  Transfers Overall transfer level: Modified independent Equipment used: Rolling walker (2 wheeled)             General transfer comment: Pt able to perform sit to stand as well as 2 squat pivots from bed <>BSC with weight on heel on LLE  Ambulation/Gait             General Gait Details: pt denied ambulation stating he is only going to use WC at home. Pt plans to scoot up stairs on his bottom and denied need for ambulance transport  Stairs            Wheelchair Mobility    Modified Rankin (Stroke Patients Only)       Balance Overall balance assessment: Mild deficits observed,  not formally tested                                           Pertinent Vitals/Pain Pain Assessment: No/denies pain    Home Living Family/patient expects to be discharged to:: Private residence Living Arrangements: Spouse/significant other Available Help at Discharge: Family;Available 24 hours/day Type of Home: Apartment Home Access: Stairs to enter Entrance Stairs-Rails: Right;Left Entrance Stairs-Number of Steps: 16 Home Layout: One level Home Equipment: Cane - single point;Wheelchair - manual;Tub bench;Grab bars - tub/shower;Walker - 2 wheels;Walker - 4 wheels      Prior Function Level of Independence: Independent         Comments: pt reports he normally cooks for his wife and has assist for housekeeping     Hand Dominance        Extremity/Trunk Assessment   Upper Extremity Assessment Upper Extremity Assessment: Overall WFL for tasks assessed    Lower Extremity Assessment Lower Extremity Assessment: Overall WFL for tasks assessed    Cervical / Trunk Assessment Cervical / Trunk Assessment: Normal  Communication   Communication: No difficulties  Cognition Arousal/Alertness: Awake/alert Behavior During Therapy: WFL for tasks assessed/performed Overall Cognitive Status: Within Functional Limits for tasks assessed  General Comments      Exercises     Assessment/Plan    PT Assessment Patient needs continued PT services  PT Problem List Decreased mobility;Decreased balance;Decreased activity tolerance       PT Treatment Interventions Functional mobility training;Therapeutic activities;Patient/family education;Stair training    PT Goals (Current goals can be found in the Care Plan section)  Acute Rehab PT Goals Patient Stated Goal: return home PT Goal Formulation: With patient Time For Goal Achievement: 03/18/21 Potential to Achieve Goals: Good    Frequency Min 3X/week    Barriers to discharge Decreased caregiver support      Co-evaluation               AM-PAC PT "6 Clicks" Mobility  Outcome Measure Help needed turning from your back to your side while in a flat bed without using bedrails?: None Help needed moving from lying on your back to sitting on the side of a flat bed without using bedrails?: None Help needed moving to and from a bed to a chair (including a wheelchair)?: None Help needed standing up from a chair using your arms (e.g., wheelchair or bedside chair)?: None Help needed to walk in hospital room?: A Lot Help needed climbing 3-5 steps with a railing? : A Little 6 Click Score: 21    End of Session   Activity Tolerance: Patient tolerated treatment well Patient left: in bed Nurse Communication: Mobility status PT Visit Diagnosis: Other abnormalities of gait and mobility (R26.89)    Time: 2800-3491 PT Time Calculation (min) (ACUTE ONLY): 28 min   Charges:   PT Evaluation $PT Eval Moderate Complexity: 1 Mod          Kiwan Gadsden P, PT Acute Rehabilitation Services Pager: [phone] Office: [phone]   Liahm Grivas B Terriona Horlacher 03/11/2021, 8:53 AM

## 2021-03-11 NOTE — Evaluation (Signed)
Occupational Therapy Evaluation Patient Details Name: Samuel Roth MRN: [id number] DOB: 1951/04/09 Today's Date: 03/11/2021    History of Present Illness 70 yo admitted 5/31 with left little toe dislocation and osteomyelitis s/p amputation on 6/3. PMHx: glaucoma, legally blind, Rt BKA, DM, HTN   Clinical Impression   Patient admitted for the diagnosis above.  PTA he lives with his spouse, who help one another as much as possible.  She has limitations, and he is legally blind.  Barriers are listed below.  He is able to complete his ADL from supine/sit level.  He has the post op shoe, and OT reviewed placement and transfers completed with Mod I at RW level.  No further OT needs in the acute setting.  HH PT is being arranged, no HH OT needs.      Follow Up Recommendations  No OT follow up    Equipment Recommendations  None recommended by OT    Recommendations for Other Services       Precautions / Restrictions Precautions Precautions: Fall Restrictions Weight Bearing Restrictions: Yes LLE Weight Bearing: Weight bearing as tolerated Other Position/Activity Restrictions: with post op shoe      Mobility Bed Mobility Overal bed mobility: Modified Independent               Patient Response: Cooperative  Transfers Overall transfer level: Modified independent Equipment used: Rolling walker (2 wheeled)                Balance Overall balance assessment: Mild deficits observed, not formally tested                                         ADL either performed or assessed with clinical judgement   ADL Overall ADL's : Modified independent                                       General ADL Comments: able to dress himself from supine to sit level.  Will be using wheelchair as primary mode of transportation.     Vision Baseline Vision/History: Legally blind Patient Visual Report: No change from baseline       Perception     Praxis       Pertinent Vitals/Pain Pain Assessment: No/denies pain     Hand Dominance Right   Extremity/Trunk Assessment Upper Extremity Assessment Upper Extremity Assessment: Overall WFL for tasks assessed   Lower Extremity Assessment Lower Extremity Assessment: Defer to PT evaluation   Cervical / Trunk Assessment Cervical / Trunk Assessment: Normal   Communication Communication Communication: No difficulties   Cognition Arousal/Alertness: Awake/alert Behavior During Therapy: WFL for tasks assessed/performed Overall Cognitive Status: Within Functional Limits for tasks assessed                                       Home Living Family/patient expects to be discharged to:: Private residence Living Arrangements: Spouse/significant other Available Help at Discharge: Family;Available 24 hours/day Type of Home: Apartment Home Access: Stairs to enter Entrance Stairs-Number of Steps: 16 Entrance Stairs-Rails: Right;Left Home Layout: One level     Bathroom Shower/Tub: Tub/shower unit         Home Equipment: Cane - single point;Wheelchair - manual;Tub bench;Grab  bars - tub/shower;Walker - 2 wheels;Walker - 4 wheels          Prior Functioning/Environment Level of Independence: Independent        Comments: per chart: pt reports he normally cooks for his wife and has assist for housekeeping        OT Problem List: Impaired balance (sitting and/or standing)      OT Treatment/Interventions: Balance training    OT Goals(Current goals can be found in the care plan section) Acute Rehab OT Goals Patient Stated Goal: ready to get back home OT Goal Formulation: With patient Time For Goal Achievement: 03/11/21  OT Frequency:   Barriers to D/C:    none noted       Co-evaluation              AM-PAC OT "6 Clicks" Daily Activity     Outcome Measure Help from another person eating meals?: None Help from another person taking care of personal grooming?:  None Help from another person toileting, which includes using toliet, bedpan, or urinal?: None Help from another person bathing (including washing, rinsing, drying)?: A Little Help from another person to put on and taking off regular upper body clothing?: None Help from another person to put on and taking off regular lower body clothing?: A Little 6 Click Score: 22   End of Session Nurse Communication: Other (comment) (RN approved tele off for dressing.  Tele called.)  Activity Tolerance: Patient tolerated treatment well Patient left: in bed;with call bell/phone within reach  OT Visit Diagnosis: Unsteadiness on feet (R26.81)                Time: 2355-7322 OT Time Calculation (min): 24 min Charges:  OT General Charges $OT Visit: 1 Visit OT Evaluation $OT Eval Moderate Complexity: 1 Mod OT Treatments $Self Care/Home Management : 8-22 mins  03/11/2021  Rich, OTR/L  Acute Rehabilitation Services  Office:  [phone]   Samuel Roth 03/11/2021, 11:09 AM

## 2021-03-12 LAB — CULTURE, BLOOD (ROUTINE X 2)
Culture: NO GROWTH
Culture: NO GROWTH
Special Requests: ADEQUATE

## 2021-03-20 ENCOUNTER — Encounter: Payer: Self-pay | Admitting: Orthopedic Surgery

## 2021-03-20 ENCOUNTER — Ambulatory Visit (INDEPENDENT_AMBULATORY_CARE_PROVIDER_SITE_OTHER): Payer: Medicare Other | Admitting: Orthopedic Surgery

## 2021-03-20 ENCOUNTER — Other Ambulatory Visit: Payer: Self-pay

## 2021-03-20 DIAGNOSIS — Z89422 Acquired absence of other left toe(s): Secondary | ICD-10-CM

## 2021-03-20 NOTE — Progress Notes (Signed)
Office Visit Note   Patient: Samuel Roth           Date of Birth: [birth year]           MRN: [id number] Visit Date: 03/20/2021              Requested by: No referring provider defined for this encounter. PCP: Patient, No Pcp Per (Inactive)  Chief Complaint  Patient presents with   Left Foot - Follow-up    5th toe amputation      HPI: Patient is a 70 year old gentleman who presents in follow-up for left foot fifth ray amputation he has no complaints he states that his sensation is better in the left foot at this time.  Assessment & Plan: Visit Diagnoses:  1. 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  Follow-Up Instructions: Return in about 2 weeks (around 04/03/2021).   Ortho Exam  Patient is alert, oriented, no adenopathy, well-dressed, normal affect, normal respiratory effort. Examination the surgical incision is healing nicely there is no redness no cellulitis no drainage.  The wound edges are well approximated.  Imaging: No results found. No images are attached to the encounter.  Labs: Lab Results  Component Value Date   HGBA1C 6.2 (H) 03/08/2021   HGBA1C QNSTST 03/07/2021   HGBA1C 9.5 (H) 02/28/2017   ESRSEDRATE 13 03/07/2021   CRP 29.1 (H) 03/01/2017   REPTSTATUS 03/12/2021 FINAL 03/07/2021   CULT  03/07/2021    NO GROWTH 5 DAYS Performed at [facility], [address] [place], [place] [address]      Lab Results  Component Value Date   ALBUMIN 3.3 (L) 03/10/2021   ALBUMIN 3.2 (L) 03/08/2021   ALBUMIN 4.2 03/07/2021    Lab Results  Component Value Date   MG 2.0 03/01/2017   No results found for: VD25OH  No results found for: PREALBUMIN CBC EXTENDED Latest Ref Rng & Units 03/11/2021 03/10/2021 03/08/2021  WBC 4.0 - 10.5  K/uL 8.7 5.0 6.8  RBC 4.22 - 5.81 MIL/uL 4.90 4.84 4.50  HGB 13.0 - 17.0 g/dL 23.9 53.2 02.3  HCT 34.3 - 52.0 % 44.5 44.2 40.9  PLT 150 - 400 K/uL 157 186 191  NEUTROABS 1.7 - 7.7 K/uL - - -  LYMPHSABS 0.7 - 4.0 K/uL - - -     There is no height or weight on file to calculate BMI.  Orders:  No orders of the defined types were placed in this encounter.  No orders of the defined types were placed in this encounter.    Procedures: No procedures performed  Clinical Data: No additional findings.  ROS:  All other systems negative, except as noted in the HPI. Review of Systems  Objective: Vital Signs: There were no vitals taken for this visit.  Specialty Comments:  No specialty comments available.  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 No pertinent family history.  Past Surgical History:  Procedure Laterality Date   AMPUTATION Right 03/01/2017   Procedure: RIGHT TRANS METATARSAL AMPUTATION;  Surgeon: Nadara Mustard, MD;  Location: [facility] OR;  Service: Orthopedics;  Laterality: Right;   AMPUTATION Right 03/03/2017   Procedure: RIGHT BKA;  Surgeon: Nadara Mustard, MD;  Location: [facility] OR;  Service: Orthopedics;  Laterality: Right;   AMPUTATION TOE Left 03/10/2021   Procedure: AMPUTATION LEFT LITTLE TOE;  Surgeon: Nadara Mustard, MD;  Location: [facility] OR;  Service: Orthopedics;  Laterality: Left;   EYE SURGERY     TIBIA FRACTURE  SURGERY     Social History   Occupational History   Not on file  Tobacco Use   Smoking status: Former    Pack years: 0.00   Smokeless tobacco: Former  Substance and Sexual Activity   Alcohol use: Yes    Comment: social   Drug use: No   Sexual activity: Not on file

## 2021-04-03 ENCOUNTER — Ambulatory Visit (INDEPENDENT_AMBULATORY_CARE_PROVIDER_SITE_OTHER): Payer: Medicare Other | Admitting: Physician Assistant

## 2021-04-03 ENCOUNTER — Other Ambulatory Visit: Payer: Self-pay

## 2021-04-03 ENCOUNTER — Encounter: Payer: Self-pay | Admitting: Orthopedic Surgery

## 2021-04-03 DIAGNOSIS — Z89422 Acquired absence of other left toe(s): Secondary | ICD-10-CM

## 2021-04-03 NOTE — Progress Notes (Signed)
Office Visit Note   Patient: Samuel Roth           Date of Birth: [birth year]           MRN: [id number] Visit Date: 04/03/2021              Requested by: No referring provider defined for this encounter. PCP: Patient, No Pcp Per (Inactive)  Chief Complaint  Patient presents with   Left Foot - Routine Post Op    03/10/21 left foot 5th toe       HPI: Patient is 3-1/2 weeks status post left fifth ray amputation.  He is overall doing well.  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  He is an avid walker and is wondering when he can progress to walking.  Assessment & Plan: Visit Diagnoses: No diagnosis found.  Plan: Stitches were removed today and he may transition into a regular shoe.  We had an extensive discussion about walking and starting a short distance and gradually increasing his mileage.  He understands he is to inspect his amputation stump and his foot every time he goes for a walk to make sure there is no irritation.  He will follow-up for final visit in 1 month he may transition into a regular supportive shoe as tolerated  Follow-Up Instructions: No follow-ups on file.   Ortho Exam  Patient is alert, oriented, no adenopathy, well-dressed, normal affect, normal respiratory effort. Left foot fifth ray amputation incision is healed sutures are in place will be harvested today.  No ascending cellulitis or signs of infection.  No swelling.  Imaging: No results found. No images are attached to the encounter.  Labs: Lab Results  Component Value Date   HGBA1C 6.2 (H) 03/08/2021   HGBA1C QNSTST 03/07/2021   HGBA1C 9.5 (H) 02/28/2017   ESRSEDRATE 13 03/07/2021   CRP 29.1 (H) 03/01/2017   REPTSTATUS 03/12/2021 FINAL 03/07/2021   CULT  03/07/2021    NO GROWTH 5 DAYS Performed at [facility], [address], [place], [place] [address]      Lab Results  Component Value Date   ALBUMIN 3.3 (L) 03/10/2021   ALBUMIN 3.2 (L) 03/08/2021   ALBUMIN  4.2 03/07/2021    Lab Results  Component Value Date   MG 2.0 03/01/2017   No results found for: VD25OH  No results found for: PREALBUMIN CBC EXTENDED Latest Ref Rng & Units 03/11/2021 03/10/2021 03/08/2021  WBC 4.0 - 10.5 K/uL 8.7 5.0 6.8  RBC 4.22 - 5.81 MIL/uL 4.90 4.84 4.50  HGB 13.0 - 17.0 g/dL 05.1 10.2 11.1  HCT 73.5 - 52.0 % 44.5 44.2 40.9  PLT 150 - 400 K/uL 157 186 191  NEUTROABS 1.7 - 7.7 K/uL - - -  LYMPHSABS 0.7 - 4.0 K/uL - - -     There is no height or weight on file to calculate BMI.  Orders:  No orders of the defined types were placed in this encounter.  No orders of the defined types were placed in this encounter.    Procedures: No procedures performed  Clinical Data: No additional findings.  ROS:  All other systems negative, except as noted in the HPI. Review of Systems  Objective: Vital Signs: There were no vitals taken for this visit.  Specialty Comments:  No specialty comments available.  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  Past Surgical History:  Procedure Laterality Date   AMPUTATION Right 03/01/2017   Procedure: RIGHT TRANS METATARSAL AMPUTATION;  Surgeon: Nadara Mustard, MD;  Location: [facility] OR;  Service: Orthopedics;  Laterality: Right;   AMPUTATION Right 03/03/2017   Procedure: RIGHT BKA;  Surgeon: Nadara Mustard, MD;  Location: [facility] OR;  Service: Orthopedics;  Laterality:  Right;   AMPUTATION TOE Left 03/10/2021   Procedure: AMPUTATION LEFT LITTLE TOE;  Surgeon: Nadara Mustard, MD;  Location: [facility] OR;  Service: Orthopedics;  Laterality: Left;   EYE SURGERY     TIBIA FRACTURE SURGERY     Social History   Occupational History   Not on file  Tobacco Use   Smoking status: Former    Pack years: 0.00   Smokeless tobacco: Former  Substance and Sexual Activity   Alcohol use: Yes    Comment: social   Drug use: No   Sexual activity: Not on file

## 2021-05-04 ENCOUNTER — Encounter: Payer: Medicare Other | Admitting: Orthopedic Surgery

## 2021-05-08 ENCOUNTER — Encounter: Payer: Self-pay | Admitting: Orthopedic Surgery

## 2021-05-08 ENCOUNTER — Other Ambulatory Visit: Payer: Self-pay

## 2021-05-08 ENCOUNTER — Ambulatory Visit (INDEPENDENT_AMBULATORY_CARE_PROVIDER_SITE_OTHER): Payer: Medicare Other | Admitting: Physician Assistant

## 2021-05-08 VITALS — Ht 69.0 in | Wt 175.0 lb

## 2021-05-08 DIAGNOSIS — Z89422 Acquired absence of other left toe(s): Secondary | ICD-10-CM

## 2021-05-08 NOTE — Progress Notes (Signed)
Office Visit Note   Patient: Samuel Roth           Date of Birth: 1951/04/05           MRN: [id number] Visit Date: 05/08/2021              Requested by: No referring provider defined for this encounter. PCP: Patient, No Pcp Per (Inactive)  Chief Complaint  Patient presents with   Left Foot - Follow-up    Left fifth toe amputation 03/10/2021      HPI: Patient is a pleasant 70 year old gentleman who is status post left fifth toe amputation 2 months ago.  He is doing well and has begun walking.  He is also status post right below-knee amputation and states that his prosthetic is fitting well his only complaint is of some instability feelings on the lateral side of his left foot when he walks  Assessment & Plan: Visit Diagnoses: No diagnosis found.  Plan: Patient will look for a carbon fiber plate for his shoe should check his feet on a daily basis may follow-up with [place] as needed  Follow-Up Instructions: No follow-ups on file.   Ortho Exam  Patient is alert, oriented, no adenopathy, well-dressed, normal affect, normal respiratory effort. Left foot well-healed surgical incision no swelling no cellulitis no signs of infection.  Incision is completely healed  Imaging: No results found. No images are attached to the encounter.  Labs: Lab Results  Component Value Date   HGBA1C 6.2 (H) 03/08/2021   HGBA1C QNSTST 03/07/2021   HGBA1C 9.5 (H) 02/28/2017   ESRSEDRATE 13 03/07/2021   CRP 29.1 (H) 03/01/2017   REPTSTATUS 03/12/2021 FINAL 03/07/2021   CULT  03/07/2021    NO GROWTH 5 DAYS Performed at [facility], [address], [place], [place] [address]      Lab Results  Component Value Date   ALBUMIN 3.3 (L) 03/10/2021   ALBUMIN 3.2 (L) 03/08/2021   ALBUMIN 4.2 03/07/2021    Lab Results  Component Value Date   MG 2.0 03/01/2017   No results found for: VD25OH  No results found for: PREALBUMIN CBC EXTENDED Latest Ref Rng & Units 03/11/2021 03/10/2021 03/08/2021   WBC 4.0 - 10.5 K/uL 8.7 5.0 6.8  RBC 4.22 - 5.81 MIL/uL 4.90 4.84 4.50  HGB 13.0 - 17.0 g/dL 93.7 16.9 67.8  HCT 93.8 - 52.0 % 44.5 44.2 40.9  PLT 150 - 400 K/uL 157 186 191  NEUTROABS 1.7 - 7.7 K/uL - - -  LYMPHSABS 0.7 - 4.0 K/uL - - -     Body mass index is 25.84 kg/m.  Orders:  No orders of the defined types were placed in this encounter.  No orders of the defined types were placed in this encounter.    Procedures: No procedures performed  Clinical Data: No additional findings.  ROS:  All other systems negative, except as noted in the HPI. Review of Systems  Objective: Vital Signs: Ht 5\' 9"  (1.753 m)   Wt 175 lb (79.4 kg)   BMI 25.84 kg/m   Specialty Comments:  No specialty comments available.  PMFS History: Patient Active Problem List   Diagnosis Date Noted   Toe trauma, left, initial encounter    Chronic osteomyelitis of toe of left foot (HCC)    Abscess of left great toe    Cellulitis of left foot    Acquired absence of right leg below knee (HCC) 03/13/2017   Diabetic polyneuropathy associated with  type 1 diabetes mellitus (HCC)    Diabetes mellitus, new onset (HCC)    Acute renal failure (HCC)    Acute kidney injury (HCC) 02/27/2017   Hyperglycemia 02/27/2017   Leukocytosis 02/27/2017   Past Medical History:  Diagnosis Date   Glaucoma    Legally blind     History reviewed. No pertinent family history.  Past Surgical History:  Procedure Laterality Date   AMPUTATION Right 03/01/2017   Procedure: RIGHT TRANS METATARSAL AMPUTATION;  Surgeon: Nadara Mustard, MD;  Location: [facility] OR;  Service: Orthopedics;  Laterality: Right;   AMPUTATION Right 03/03/2017   Procedure: RIGHT BKA;  Surgeon: Nadara Mustard, MD;  Location: [facility] OR;  Service: Orthopedics;  Laterality: Right;   AMPUTATION TOE Left 03/10/2021   Procedure: AMPUTATION LEFT LITTLE TOE;  Surgeon: Nadara Mustard, MD;  Location: [facility] OR;  Service: Orthopedics;  Laterality: Left;   EYE SURGERY      TIBIA FRACTURE SURGERY     Social History   Occupational History   Not on file  Tobacco Use   Smoking status: Former   Smokeless tobacco: Former  Substance and Sexual Activity   Alcohol use: Yes    Comment: social   Drug use: No   Sexual activity: Not on file

## 2021-07-21 ENCOUNTER — Telehealth: Payer: Self-pay | Admitting: Orthopedic Surgery

## 2021-07-21 NOTE — Telephone Encounter (Signed)
Pt calling stating [facility] asked for an updated prescription but pt unsure if he needs to come back in for a follow up appt or what the process is for that. The best call back number [phone].

## 2021-07-21 NOTE — Telephone Encounter (Signed)
[facility]ed order to [facility] called pt to advise. Will call with any questions.

## 2024-06-26 ENCOUNTER — Inpatient Hospital Stay (HOSPITAL_COMMUNITY)
Admission: EM | Admit: 2024-06-26 | Discharge: 2024-07-01 | DRG: 617 | Disposition: A | Discharge status: Home Health | Source: Ambulatory Visit | Attending: Family Medicine | Admitting: Family Medicine

## 2024-06-26 ENCOUNTER — Encounter (HOSPITAL_COMMUNITY): Payer: Self-pay | Admitting: *Deleted

## 2024-06-26 ENCOUNTER — Other Ambulatory Visit: Payer: Self-pay

## 2024-06-26 ENCOUNTER — Emergency Department (HOSPITAL_COMMUNITY)

## 2024-06-26 DIAGNOSIS — H548 Legal blindness, as defined in USA: Secondary | ICD-10-CM | POA: Diagnosis present

## 2024-06-26 DIAGNOSIS — E1169 Type 2 diabetes mellitus with other specified complication: Principal | ICD-10-CM | POA: Diagnosis present

## 2024-06-26 DIAGNOSIS — E11621 Type 2 diabetes mellitus with foot ulcer: Secondary | ICD-10-CM | POA: Diagnosis present

## 2024-06-26 DIAGNOSIS — Z89511 Acquired absence of right leg below knee: Secondary | ICD-10-CM

## 2024-06-26 DIAGNOSIS — L089 Local infection of the skin and subcutaneous tissue, unspecified: Secondary | ICD-10-CM | POA: Diagnosis present

## 2024-06-26 DIAGNOSIS — L0390 Cellulitis, unspecified: Principal | ICD-10-CM

## 2024-06-26 DIAGNOSIS — D696 Thrombocytopenia, unspecified: Secondary | ICD-10-CM | POA: Diagnosis present

## 2024-06-26 DIAGNOSIS — L03032 Cellulitis of left toe: Secondary | ICD-10-CM | POA: Diagnosis present

## 2024-06-26 DIAGNOSIS — Z79899 Other long term (current) drug therapy: Secondary | ICD-10-CM

## 2024-06-26 DIAGNOSIS — Z89422 Acquired absence of other left toe(s): Secondary | ICD-10-CM

## 2024-06-26 DIAGNOSIS — Z7982 Long term (current) use of aspirin: Secondary | ICD-10-CM

## 2024-06-26 DIAGNOSIS — M869 Osteomyelitis, unspecified: Secondary | ICD-10-CM

## 2024-06-26 DIAGNOSIS — Z23 Encounter for immunization: Secondary | ICD-10-CM

## 2024-06-26 DIAGNOSIS — N179 Acute kidney failure, unspecified: Secondary | ICD-10-CM | POA: Diagnosis present

## 2024-06-26 DIAGNOSIS — M86172 Other acute osteomyelitis, left ankle and foot: Principal | ICD-10-CM | POA: Diagnosis present

## 2024-06-26 DIAGNOSIS — K921 Melena: Secondary | ICD-10-CM | POA: Diagnosis not present

## 2024-06-26 DIAGNOSIS — Z5982 Transportation insecurity: Secondary | ICD-10-CM

## 2024-06-26 DIAGNOSIS — Z5901 Sheltered homelessness: Secondary | ICD-10-CM

## 2024-06-26 DIAGNOSIS — Z881 Allergy status to other antibiotic agents status: Secondary | ICD-10-CM

## 2024-06-26 DIAGNOSIS — L03116 Cellulitis of left lower limb: Secondary | ICD-10-CM | POA: Diagnosis not present

## 2024-06-26 DIAGNOSIS — I10 Essential (primary) hypertension: Secondary | ICD-10-CM | POA: Diagnosis present

## 2024-06-26 DIAGNOSIS — L97529 Non-pressure chronic ulcer of other part of left foot with unspecified severity: Secondary | ICD-10-CM | POA: Diagnosis present

## 2024-06-26 DIAGNOSIS — Z87891 Personal history of nicotine dependence: Secondary | ICD-10-CM

## 2024-06-26 DIAGNOSIS — Z794 Long term (current) use of insulin: Secondary | ICD-10-CM

## 2024-06-26 HISTORY — DX: Type 2 diabetes mellitus without complications: E11.9

## 2024-06-26 LAB — COMPREHENSIVE METABOLIC PANEL WITH GFR
ALT: 15 U/L (ref 0–44)
AST: 21 U/L (ref 15–41)
Albumin: 3.9 g/dL (ref 3.5–5.0)
Alkaline Phosphatase: 61 U/L (ref 38–126)
Anion gap: 14 (ref 5–15)
BUN: 20 mg/dL (ref 8–23)
CO2: 21 mmol/L — ABNORMAL LOW (ref 22–32)
Calcium: 8.8 mg/dL — ABNORMAL LOW (ref 8.9–10.3)
Chloride: 100 mmol/L (ref 98–111)
Creatinine, Ser: 1.57 mg/dL — ABNORMAL HIGH (ref 0.61–1.24)
GFR, Estimated: 47 mL/min — ABNORMAL LOW (ref >60)
Glucose, Bld: 130 mg/dL — ABNORMAL HIGH (ref 70–99)
Potassium: 4.1 mmol/L (ref 3.5–5.1)
Sodium: 135 mmol/L (ref 135–145)
Total Bilirubin: 0.6 mg/dL (ref 0.0–1.2)
Total Protein: 7.3 g/dL (ref 6.5–8.1)

## 2024-06-26 LAB — CBC WITH DIFFERENTIAL/PLATELET
Abs Immature Granulocytes: 0.02 K/uL (ref 0.00–0.07)
Basophils Absolute: 0 K/uL (ref 0.0–0.1)
Basophils Relative: 0 %
Eosinophils Absolute: 0.1 K/uL (ref 0.0–0.5)
Eosinophils Relative: 1 %
HCT: 44.3 % (ref 39.0–52.0)
Hemoglobin: 14.7 g/dL (ref 13.0–17.0)
Immature Granulocytes: 0 %
Lymphocytes Relative: 15 %
Lymphs Abs: 1.2 K/uL (ref 0.7–4.0)
MCH: 30.7 pg (ref 26.0–34.0)
MCHC: 33.2 g/dL (ref 30.0–36.0)
MCV: 92.5 fL (ref 80.0–100.0)
Monocytes Absolute: 0.8 K/uL (ref 0.1–1.0)
Monocytes Relative: 10 %
Neutro Abs: 6.1 K/uL (ref 1.7–7.7)
Neutrophils Relative %: 74 %
Platelets: 150 K/uL (ref 150–400)
RBC: 4.79 MIL/uL (ref 4.22–5.81)
RDW: 13.2 % (ref 11.5–15.5)
WBC: 8.3 K/uL (ref 4.0–10.5)
nRBC: 0 % (ref 0.0–0.2)

## 2024-06-26 LAB — I-STAT CHEM 8, ED
BUN: 21 mg/dL (ref 8–23)
Calcium, Ion: 1.1 mmol/L — ABNORMAL LOW (ref 1.15–1.40)
Chloride: 102 mmol/L (ref 98–111)
Creatinine, Ser: 1.5 mg/dL — ABNORMAL HIGH (ref 0.61–1.24)
Glucose, Bld: 132 mg/dL — ABNORMAL HIGH (ref 70–99)
HCT: 44 % (ref 39.0–52.0)
Hemoglobin: 15 g/dL (ref 13.0–17.0)
Potassium: 4.1 mmol/L (ref 3.5–5.1)
Sodium: 137 mmol/L (ref 135–145)
TCO2: 23 mmol/L (ref 22–32)

## 2024-06-26 LAB — I-STAT CG4 LACTIC ACID, ED: Lactic Acid, Venous: 1.1 mmol/L (ref 0.5–1.9)

## 2024-06-26 LAB — HIV ANTIBODY (ROUTINE TESTING W REFLEX): HIV Screen 4th Generation wRfx: NONREACTIVE

## 2024-06-26 NOTE — ED Triage Notes (Signed)
 Pt in POV with reported L 4th toe infection, states he was sent over by his PCP for concern over increased warmth and swelling.

## 2024-06-26 NOTE — ED Notes (Signed)
 Called pt for vitals check.. no response.Samuel Roth

## 2024-06-27 ENCOUNTER — Emergency Department (HOSPITAL_COMMUNITY)

## 2024-06-27 DIAGNOSIS — D696 Thrombocytopenia, unspecified: Secondary | ICD-10-CM | POA: Diagnosis present

## 2024-06-27 DIAGNOSIS — L089 Local infection of the skin and subcutaneous tissue, unspecified: Secondary | ICD-10-CM | POA: Diagnosis present

## 2024-06-27 DIAGNOSIS — E11621 Type 2 diabetes mellitus with foot ulcer: Secondary | ICD-10-CM | POA: Diagnosis present

## 2024-06-27 DIAGNOSIS — Z89422 Acquired absence of other left toe(s): Secondary | ICD-10-CM | POA: Diagnosis not present

## 2024-06-27 DIAGNOSIS — Z794 Long term (current) use of insulin: Secondary | ICD-10-CM | POA: Diagnosis not present

## 2024-06-27 DIAGNOSIS — M86172 Other acute osteomyelitis, left ankle and foot: Secondary | ICD-10-CM | POA: Diagnosis present

## 2024-06-27 DIAGNOSIS — Z881 Allergy status to other antibiotic agents status: Secondary | ICD-10-CM | POA: Diagnosis not present

## 2024-06-27 DIAGNOSIS — Z5901 Sheltered homelessness: Secondary | ICD-10-CM | POA: Diagnosis not present

## 2024-06-27 DIAGNOSIS — E1169 Type 2 diabetes mellitus with other specified complication: Secondary | ICD-10-CM | POA: Diagnosis present

## 2024-06-27 DIAGNOSIS — M869 Osteomyelitis, unspecified: Secondary | ICD-10-CM | POA: Diagnosis not present

## 2024-06-27 DIAGNOSIS — Z79899 Other long term (current) drug therapy: Secondary | ICD-10-CM | POA: Diagnosis not present

## 2024-06-27 DIAGNOSIS — K921 Melena: Secondary | ICD-10-CM | POA: Diagnosis not present

## 2024-06-27 DIAGNOSIS — Z87891 Personal history of nicotine dependence: Secondary | ICD-10-CM | POA: Diagnosis not present

## 2024-06-27 DIAGNOSIS — L97529 Non-pressure chronic ulcer of other part of left foot with unspecified severity: Secondary | ICD-10-CM | POA: Diagnosis present

## 2024-06-27 DIAGNOSIS — Z5982 Transportation insecurity: Secondary | ICD-10-CM | POA: Diagnosis not present

## 2024-06-27 DIAGNOSIS — Z89511 Acquired absence of right leg below knee: Secondary | ICD-10-CM | POA: Diagnosis not present

## 2024-06-27 DIAGNOSIS — E1165 Type 2 diabetes mellitus with hyperglycemia: Secondary | ICD-10-CM | POA: Diagnosis not present

## 2024-06-27 DIAGNOSIS — Z23 Encounter for immunization: Secondary | ICD-10-CM | POA: Diagnosis present

## 2024-06-27 DIAGNOSIS — L03116 Cellulitis of left lower limb: Secondary | ICD-10-CM | POA: Diagnosis present

## 2024-06-27 DIAGNOSIS — Z7982 Long term (current) use of aspirin: Secondary | ICD-10-CM | POA: Diagnosis not present

## 2024-06-27 DIAGNOSIS — N179 Acute kidney failure, unspecified: Secondary | ICD-10-CM | POA: Diagnosis present

## 2024-06-27 DIAGNOSIS — I10 Essential (primary) hypertension: Secondary | ICD-10-CM | POA: Diagnosis present

## 2024-06-27 DIAGNOSIS — L03032 Cellulitis of left toe: Secondary | ICD-10-CM | POA: Diagnosis present

## 2024-06-27 DIAGNOSIS — H548 Legal blindness, as defined in USA: Secondary | ICD-10-CM | POA: Diagnosis present

## 2024-06-27 LAB — CBC
HCT: 42.1 % (ref 39.0–52.0)
Hemoglobin: 14.3 g/dL (ref 13.0–17.0)
MCH: 31.2 pg (ref 26.0–34.0)
MCHC: 34 g/dL (ref 30.0–36.0)
MCV: 91.7 fL (ref 80.0–100.0)
Platelets: 147 K/uL — ABNORMAL LOW (ref 150–400)
RBC: 4.59 MIL/uL (ref 4.22–5.81)
RDW: 13.3 % (ref 11.5–15.5)
WBC: 6.1 K/uL (ref 4.0–10.5)
nRBC: 0 % (ref 0.0–0.2)

## 2024-06-27 LAB — HEMOGLOBIN A1C
Hgb A1c MFr Bld: 5.6 % (ref 4.8–5.6)
Mean Plasma Glucose: 114.02 mg/dL

## 2024-06-27 LAB — CREATININE, SERUM
Creatinine, Ser: 1.38 mg/dL — ABNORMAL HIGH (ref 0.61–1.24)
GFR, Estimated: 54 mL/min — ABNORMAL LOW (ref >60)

## 2024-06-27 LAB — CBG MONITORING, ED: Glucose-Capillary: 120 mg/dL — ABNORMAL HIGH (ref 70–99)

## 2024-06-27 LAB — GLUCOSE, CAPILLARY
Glucose-Capillary: 146 mg/dL — ABNORMAL HIGH (ref 70–99)
Glucose-Capillary: 185 mg/dL — ABNORMAL HIGH (ref 70–99)
Glucose-Capillary: 200 mg/dL — ABNORMAL HIGH (ref 70–99)
Glucose-Capillary: 117 mg/dL — ABNORMAL HIGH (ref 70–99)

## 2024-06-27 MED ORDER — PIPERACILLIN-TAZOBACTAM 3.375 G IVPB 30 MIN
3.3750 g | Freq: Once | INTRAVENOUS | Status: AC
  Administered 2024-06-27: 3.375 g via INTRAVENOUS
  Filled 2024-06-27: qty 50

## 2024-06-27 MED ORDER — GADOBUTROL 1 MMOL/ML IV SOLN
7.5000 mL | Freq: Once | INTRAVENOUS | Status: AC | PRN
  Administered 2024-06-27: 7.5 mL via INTRAVENOUS

## 2024-06-27 MED ORDER — INFLUENZA VAC SPLIT HIGH-DOSE 0.5 ML IM SUSY
0.5000 mL | PREFILLED_SYRINGE | INTRAMUSCULAR | Status: DC
  Filled 2024-06-27: qty 0.5

## 2024-06-27 MED ORDER — SODIUM CHLORIDE 0.9 % IV SOLN
2.0000 g | Freq: Two times a day (BID) | INTRAVENOUS | Status: AC
  Administered 2024-06-27 – 2024-06-30 (×8): 2 g via INTRAVENOUS
  Filled 2024-06-27 (×8): qty 12.5

## 2024-06-27 MED ORDER — INSULIN GLARGINE-YFGN 100 UNIT/ML ~~LOC~~ SOLN: 8.0000 [IU] | Freq: Every day | SUBCUTANEOUS | Status: DC

## 2024-06-27 MED ORDER — INSULIN ASPART 100 UNIT/ML IJ SOLN
0.0000 [IU] | Freq: Three times a day (TID) | INTRAMUSCULAR | Status: DC
  Administered 2024-06-27: 2 [IU] via SUBCUTANEOUS
  Administered 2024-06-27: 3 [IU] via SUBCUTANEOUS
  Administered 2024-06-28: 2 [IU] via SUBCUTANEOUS
  Administered 2024-06-28 – 2024-06-30 (×4): 3 [IU] via SUBCUTANEOUS
  Administered 2024-07-01: 5 [IU] via SUBCUTANEOUS

## 2024-06-27 MED ORDER — VANCOMYCIN HCL 1250 MG/250ML IV SOLN
1250.0000 mg | INTRAVENOUS | Status: DC
  Administered 2024-06-28 – 2024-06-30 (×3): 1250 mg via INTRAVENOUS
  Filled 2024-06-27 (×3): qty 250

## 2024-06-27 MED ORDER — ENOXAPARIN SODIUM 40 MG/0.4ML IJ SOSY
40.0000 mg | PREFILLED_SYRINGE | INTRAMUSCULAR | Status: DC
Start: 2024-06-27 — End: 2024-07-01
  Administered 2024-06-27 – 2024-07-01 (×5): 40 mg via SUBCUTANEOUS
  Filled 2024-06-27 (×5): qty 0.4

## 2024-06-27 MED ORDER — TETANUS-DIPHTH-ACELL PERTUSSIS 5-2.5-18.5 LF-MCG/0.5 IM SUSY
0.5000 mL | PREFILLED_SYRINGE | Freq: Once | INTRAMUSCULAR | Status: AC
  Administered 2024-06-27: 0.5 mL via INTRAMUSCULAR
  Filled 2024-06-27: qty 0.5

## 2024-06-27 MED ORDER — INSULIN GLARGINE 100 UNIT/ML ~~LOC~~ SOLN
8.0000 [IU] | Freq: Every day | SUBCUTANEOUS | Status: DC
  Administered 2024-06-27 – 2024-06-30 (×3): 8 [IU] via SUBCUTANEOUS
  Filled 2024-06-27 (×8): qty 0.08

## 2024-06-27 MED ORDER — ACETAMINOPHEN 325 MG PO TABS: 650.0000 mg | ORAL_TABLET | Freq: Four times a day (QID) | ORAL | Status: DC | PRN

## 2024-06-27 MED ORDER — LOSARTAN POTASSIUM 50 MG PO TABS
50.0000 mg | ORAL_TABLET | Freq: Every day | ORAL | Status: DC
  Administered 2024-06-27 – 2024-06-28 (×2): 50 mg via ORAL
  Filled 2024-06-27 (×2): qty 1

## 2024-06-27 MED ORDER — SODIUM CHLORIDE 0.9 % IV SOLN: INTRAVENOUS | Status: AC

## 2024-06-27 MED ORDER — VANCOMYCIN HCL IN DEXTROSE 1-5 GM/200ML-% IV SOLN
1000.0000 mg | Freq: Once | INTRAVENOUS | Status: AC
  Administered 2024-06-27: 1000 mg via INTRAVENOUS
  Filled 2024-06-27: qty 200

## 2024-06-27 NOTE — ED Notes (Signed)
 CALLED [facility] FOR  [place]

## 2024-06-27 NOTE — ED Notes (Signed)
 Patient transported to MRI

## 2024-06-27 NOTE — Progress Notes (Signed)
 [place] [facility] Liaison Note:   This is a current Home Based Primary Care and Home Health patient with [facility] receiving SN services.  Liaison will continue to follow for discharge disposition.       Please call with any questions or concerns.       Thank you,   Nat Babe, BSN, [facility] Liaison [phone]

## 2024-06-27 NOTE — Progress Notes (Signed)
 Pharmacy Antibiotic Note  Samuel Roth is a 73 y.o. male admitted on 06/26/2024 with osteomyelitis.  Pharmacy has been consulted for vancomycin , cefepime  dosing.  Plan: Vancomycin  1 g IV x1 given in ED; vancomycin  1250mg  IV q 24 (eAUC 501, Scr 1.57) Goal trough 15-61mcg/mL, Goal AUC 400-600 -F/u renal function, LOT, and culture data -F/u vancomycin  levels PRN per protocol  Cefepime  2g IV q12   Height: 5' 9 (175.3 cm) Weight: 79.4 kg (175 lb 0.7 oz) IBW/kg (Calculated) : 70.7  Temp (24hrs), Avg:98.2 F (36.8 C), Min:98.2 F (36.8 C), Max:98.2 F (36.8 C)  Recent Labs  Lab 06/26/24 1958 06/26/24 2000 06/26/24 2014  WBC  --   --  8.3  CREATININE 1.50*  --  1.57*  LATICACIDVEN  --  1.1  --     Estimated Creatinine Clearance: 42.5 mL/min (A) (by C-G formula based on SCr of 1.57 mg/dL (H)).    Allergies  Allergen Reactions   Ciprofloxacin Hives    Patient states he is not allergic to Cipro, maybe only the IV form.    Antimicrobials this admission: Vanc 9/20> Cefepime  9/20> Zosyn  9/20 x1  Dose adjustments this admission:   Microbiology results: 9/20 [facility]  Thank you for allowing pharmacy to be a part of this patient's care.  Sharyne Glatter, PharmD, BCCCP Critical Care Clinical Pharmacist 06/27/2024 8:05 AM

## 2024-06-27 NOTE — Plan of Care (Signed)

## 2024-06-27 NOTE — ED Provider Notes (Signed)
 [place] EMERGENCY DEPARTMENT AT [facility] Provider Note   CSN: [id number] Arrival date & time: 06/26/24  8079     Patient presents with: Toe Infection   Samuel Roth is a 73 y.o. male.   The history is provided by the patient.  Wound Check This is a new problem. The current episode started more than 1 week ago. The problem has been gradually worsening. Pertinent negatives include no chest pain, no abdominal pain, no headaches and no shortness of breath. Nothing aggravates the symptoms. Nothing relieves the symptoms. He has tried nothing for the symptoms. The treatment provided no relief.  Patient with DM who is legally blind presents with cellulitis of the left foot and wound with purulent drainage of the 4th toe.  No fevers, no emesis.      Past Medical History:  Diagnosis Date   Diabetes mellitus without complication (HCC)    Glaucoma    Legally blind      Prior to Admission medications   Medication Sig Start Date End Date Taking? Authorizing Provider  Artificial Tear Solution (GENTEAL TEARS OP) Place 1 drop into both eyes daily as needed (For dry eyes).    [provider]  insulin  aspart (NOVOLOG ) 100 UNIT/ML injection Correction coverage: Moderate (average weight, post-op)  CBG < 70: implement hypoglycemia protocol  CBG 70 - 120: 0 units  CBG 121 - 150: 2 units  CBG 151 - 200: 3 units  CBG 201 - 250: 5 units  CBG 251 - 300: 8 units  CBG 301 - 350: 11 units  CBG 351 - 400: 15 units  CBG > 400 call MD and obtain STAT lab verification Patient taking differently: Inject 2-15 Units into the skin See admin instructions. Correction coverage: Moderate (average weight, post-op)  CBG < 70: implement hypoglycemia protocol  CBG 70 - 120: 0 units  CBG 121 - 150: 2 units  CBG 151 - 200: 3 units  CBG 201 - 250: 5 units  CBG 251 - 300: 8 units  CBG 301 - 350: 11 units  CBG 351 - 400: 15 units  CBG > 400 call MD and obtain STAT lab verification 03/05/17    Abrol, Nayana, MD  insulin  glargine (LANTUS ) 100 UNIT/ML injection Inject 0.15 mLs (15 Units total) into the skin at bedtime. Patient taking differently: Inject 8 Units into the skin at bedtime. 03/05/17   Abrol, Nayana, MD  losartan  (COZAAR ) 50 MG tablet Take 1 tablet by mouth daily. 02/13/21   [provider]  polyethylene glycol (MIRALAX  / GLYCOLAX ) 17 g packet Take 17 g by mouth daily as needed for mild constipation. 03/11/21   Lou Claretta HERO, MD    Allergies: Ciprofloxacin    Review of Systems  Constitutional:  Negative for fever.  Respiratory:  Negative for shortness of breath.   Cardiovascular:  Negative for chest pain.  Gastrointestinal:  Negative for abdominal pain.  Skin:  Positive for color change and wound.  Neurological:  Negative for headaches.  All other systems reviewed and are negative.   Updated Vital Signs BP 118/80 (BP Location: Right Arm)   Pulse 70   Temp 98.2 F (36.8 C) (Oral)   Resp 18   Ht 5' 9 (1.753 m)   Wt 79.4 kg   SpO2 100%   BMI 25.85 kg/m   Physical Exam Vitals and nursing note reviewed.  Constitutional:      General: He is not in acute distress.    Appearance: Normal  appearance. He is well-developed. He is not diaphoretic.  HENT:     Head: Normocephalic and atraumatic.     Nose: Nose normal.  Eyes:     Conjunctiva/sclera: Conjunctivae normal.     Pupils: Pupils are equal, round, and reactive to light.  Cardiovascular:     Rate and Rhythm: Normal rate and regular rhythm.     Pulses: Normal pulses.     Heart sounds: Normal heart sounds.  Pulmonary:     Effort: Pulmonary effort is normal.     Breath sounds: Normal breath sounds. No wheezing or rales.  Abdominal:     General: Bowel sounds are normal.     Palpations: Abdomen is soft.     Tenderness: There is no abdominal tenderness. There is no guarding or rebound.  Musculoskeletal:     Cervical back: Normal range of motion and neck supple.       Legs:     Right Lower  Extremity: Right leg is amputated below knee.  Skin:    General: Skin is warm and dry.  Neurological:     General: No focal deficit present.     Mental Status: He is alert and oriented to person, place, and time.     Deep Tendon Reflexes: Reflexes normal.  Psychiatric:        Mood and Affect: Mood normal.     (all labs ordered are listed, but only abnormal results are displayed) Results for orders placed or performed during the hospital encounter of 06/26/24  I-stat chem 8, ed   Collection Time: 06/26/24  7:58 PM  Result Value Ref Range   Sodium 137 135 - 145 mmol/L   Potassium 4.1 3.5 - 5.1 mmol/L   Chloride 102 98 - 111 mmol/L   BUN 21 8 - 23 mg/dL   Creatinine, Ser 8.49 (H) 0.61 - 1.24 mg/dL   Glucose, Bld 867 (H) 70 - 99 mg/dL   Calcium, Ion 8.89 (L) 1.15 - 1.40 mmol/L   TCO2 23 22 - 32 mmol/L   Hemoglobin 15.0 13.0 - 17.0 g/dL   HCT 55.9 60.9 - 47.9 %  I-Stat Lactic Acid, ED   Collection Time: 06/26/24  8:00 PM  Result Value Ref Range   Lactic Acid, Venous 1.1 0.5 - 1.9 mmol/L  Comprehensive metabolic panel   Collection Time: 06/26/24  8:14 PM  Result Value Ref Range   Sodium 135 135 - 145 mmol/L   Potassium 4.1 3.5 - 5.1 mmol/L   Chloride 100 98 - 111 mmol/L   CO2 21 (L) 22 - 32 mmol/L   Glucose, Bld 130 (H) 70 - 99 mg/dL   BUN 20 8 - 23 mg/dL   Creatinine, Ser 8.42 (H) 0.61 - 1.24 mg/dL   Calcium 8.8 (L) 8.9 - 10.3 mg/dL   Total Protein 7.3 6.5 - 8.1 g/dL   Albumin 3.9 3.5 - 5.0 g/dL   AST 21 15 - 41 U/L   ALT 15 0 - 44 U/L   Alkaline Phosphatase 61 38 - 126 U/L   Total Bilirubin 0.6 0.0 - 1.2 mg/dL   GFR, Estimated 47 (L) >60 mL/min   Anion gap 14 5 - 15  CBC with Differential   Collection Time: 06/26/24  8:14 PM  Result Value Ref Range   WBC 8.3 4.0 - 10.5 K/uL   RBC 4.79 4.22 - 5.81 MIL/uL   Hemoglobin 14.7 13.0 - 17.0 g/dL   HCT 55.6 60.9 - 47.9 %   MCV 92.5 80.0 -  100.0 fL   MCH 30.7 26.0 - 34.0 pg   MCHC 33.2 30.0 - 36.0 g/dL   RDW 86.7 88.4 -  84.4 %   Platelets 150 150 - 400 K/uL   nRBC 0.0 0.0 - 0.2 %   Neutrophils Relative % 74 %   Neutro Abs 6.1 1.7 - 7.7 K/uL   Lymphocytes Relative 15 %   Lymphs Abs 1.2 0.7 - 4.0 K/uL   Monocytes Relative 10 %   Monocytes Absolute 0.8 0.1 - 1.0 K/uL   Eosinophils Relative 1 %   Eosinophils Absolute 0.1 0.0 - 0.5 K/uL   Basophils Relative 0 %   Basophils Absolute 0.0 0.0 - 0.1 K/uL   Immature Granulocytes 0 %   Abs Immature Granulocytes 0.02 0.00 - 0.07 K/uL  HIV Antibody (routine testing w rflx)   Collection Time: 06/26/24  8:14 PM  Result Value Ref Range   HIV Screen 4th Generation wRfx Non Reactive Non Reactive   DG Foot Complete Left Result Date: 06/26/2024 CLINICAL DATA:  Pressure ulcer. EXAM: LEFT FOOT - COMPLETE 3+ VIEW COMPARISON:  Left foot x-ray 03/07/2021. FINDINGS: There has been interval amputation of the fifth toe and distal aspect of the fifth metatarsal. There is no acute fracture, dislocation or cortical erosion. The bones are osteopenic. There is a plantar calcaneal spur. There is soft tissue swelling of the dorsum of the foot and of the fourth toe. IMPRESSION: 1. Interval amputation of the fifth toe and distal aspect of the fifth metatarsal. 2. Soft tissue swelling of the dorsum of the foot and of the fourth toe 3. No radiographic findings for acute osteomyelitis. Electronically Signed   By: Greig Pique M.D.   On: 06/26/2024 20:27     Radiology: DG Foot Complete Left Result Date: 06/26/2024 CLINICAL DATA:  Pressure ulcer. EXAM: LEFT FOOT - COMPLETE 3+ VIEW COMPARISON:  Left foot x-ray 03/07/2021. FINDINGS: There has been interval amputation of the fifth toe and distal aspect of the fifth metatarsal. There is no acute fracture, dislocation or cortical erosion. The bones are osteopenic. There is a plantar calcaneal spur. There is soft tissue swelling of the dorsum of the foot and of the fourth toe. IMPRESSION: 1. Interval amputation of the fifth toe and distal aspect of  the fifth metatarsal. 2. Soft tissue swelling of the dorsum of the foot and of the fourth toe 3. No radiographic findings for acute osteomyelitis. Electronically Signed   By: Greig Pique M.D.   On: 06/26/2024 20:27     Procedures   Medications Ordered in the ED  vancomycin  (VANCOCIN ) IVPB 1000 mg/200 mL premix (has no administration in time range)  piperacillin -tazobactam (ZOSYN ) IVPB 3.375 g (3.375 g Intravenous New Bag/Given 06/27/24 0154)  Tdap (BOOSTRIX ) injection 0.5 mL (0.5 mLs Intramuscular Given 06/27/24 0155)                                    Medical Decision Making Patient directed in by NP  Amount and/or Complexity of Data Reviewed External Data Reviewed: notes.    Details: Previous notes reviewed  Labs: ordered.    Details: Normal sodium 137, normal potassium 4.1, elevated creatinine 1.5, normal white count 8.3, normal hemoglobin 14.7, normal platelets, normal lactate 1.1   Radiology: ordered and independent interpretation performed.    Details: No gas in tissues   Risk Prescription drug management. Decision regarding hospitalization.    Final diagnoses:  None   The patient appears reasonably stabilized for admission considering the current resources, flow, and capabilities available in the ED at this time, and I doubt any other [facility] requiring further screening and/or treatment in the ED prior to admission.  ED Discharge Orders     None          Alizza Sacra, MD 06/27/24 9773

## 2024-06-27 NOTE — ED Provider Notes (Signed)
 Case was discussed with Dr. Alfornia of [facility]   Nettie, Heylee Tant, MD 06/27/24 9291

## 2024-06-27 NOTE — H&P (Addendum)
 History and Physical    Patient: Samuel Roth FMW:[id number] DOB: [birth year] DOA: 06/26/2024 DOS: the patient was seen and examined on 06/27/2024 PCP: Rama, Tawni SQUIBB, MD  Patient coming from: Home  Chief Complaint:  Chief Complaint  Patient presents with   Toe Infection   HPI: Samuel Roth is a 73 y.o. male with medical history significant of hypertension, diabetes, legally blind, previous L fifth toe amputation presenting with left fourth toe infection/foot  and found to have OM of LLE 4th digit and possible abscess or MRI.  The patient presented with a foot ulcer on the LLE 4th digit, having previously undergone amputation of his LLE 5th digit. The ulcer initially appeared on this past Monday, and the patient sought assistance from his home healthcare services the following day. Initially, the ulcer was not considered severe by the healthcare team, but it worsened with increased redness; thus, the NP that was following his care recommended he have ED evaluation.  In the ED, pt initially hypotensive 99/51. Labs notable for Cr 1.57 (unclear baseline; Cr 1.22 in 2022). 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 EDP started IV abx (vancomycin /zosyn  x1) and requested medicine admission.    Review of Systems: As mentioned in the history of present illness. All other systems reviewed and are negative. Past Medical History:  Diagnosis Date   Diabetes mellitus without complication (HCC)    Glaucoma    Legally blind    Past Surgical History:  Procedure Laterality Date   AMPUTATION Right 03/01/2017   Procedure: RIGHT TRANS METATARSAL AMPUTATION;  Surgeon: Harden Jerona GAILS, MD;  Location: [facility] OR;  Service: Orthopedics;   Laterality: Right;   AMPUTATION Right 03/03/2017   Procedure: RIGHT BKA;  Surgeon: Harden Jerona GAILS, MD;  Location: [facility] OR;  Service: Orthopedics;  Laterality: Right;   AMPUTATION TOE Left 03/10/2021   Procedure: AMPUTATION LEFT LITTLE TOE;  Surgeon: Harden Jerona GAILS, MD;  Location: [facility] OR;  Service: Orthopedics;  Laterality: Left;   EYE SURGERY     TIBIA FRACTURE SURGERY     Social History:  reports that he has quit smoking. He has quit using smokeless tobacco. He reports current alcohol use. He reports that he does not use drugs.  Allergies  Allergen Reactions   Ciprofloxacin Hives    Patient states he is not allergic to Cipro, maybe only the IV form.    History reviewed. No pertinent family history.  Prior to Admission medications   Medication Sig Start Date End Date Taking? Authorizing Provider  Artificial Tear Solution (GENTEAL TEARS OP) Place 1 drop into both eyes daily as needed (For dry eyes).   Yes [provider]  aspirin 81 MG chewable tablet Chew 81 mg by mouth daily. 12/16/18  Yes [provider]  insulin  aspart (NOVOLOG ) 100 UNIT/ML injection Correction coverage: Moderate (average weight, post-op)  CBG < 70: implement hypoglycemia protocol  CBG 70 - 120: 0 units  CBG 121 - 150: 2 units  CBG 151 - 200: 3 units  CBG 201 - 250: 5 units  CBG 251 - 300: 8 units  CBG 301 - 350: 11 units  CBG 351 - 400: 15 units  CBG > 400 call MD and obtain STAT lab verification Patient taking differently: Inject 2-15  Units into the skin 3 (three) times daily with meals as needed for high blood sugar. 03/05/17  Yes Abrol, Nayana, MD  Insulin  Glargine (BASAGLAR  KWIKPEN) 100 UNIT/ML INJECT 8 UNIT(S) EVERY DAY BY SUBCUTANEOUS ROUTE AT BEDTIME. 02/19/24  Yes [provider]  losartan  (COZAAR ) 50 MG tablet Take 1 tablet by mouth daily. 02/13/21  Yes [provider]    Physical Exam: Vitals:   06/27/24 0545 06/27/24 0600 06/27/24 0645 06/27/24 0756  BP: 120/78 (!)  103/91 102/60   Pulse:  (!) 50 62   Resp:   18   Temp:    98.2 F (36.8 C)  TempSrc:      SpO2: 98% 100% 97%   Weight:      Height:       General: Alert, oriented x3, resting comfortably in no acute distress Respiratory: Lungs clear to auscultation bilaterally with normal respiratory effort; no w/r/r Cardiovascular: Regular rate and rhythm w/o m/r/g MSK: LLE 4th digit with erythema and swelling   Data Reviewed:  Lab Results  Component Value Date   WBC 8.3 06/26/2024   HGB 14.7 06/26/2024   HCT 44.3 06/26/2024   MCV 92.5 06/26/2024   PLT 150 06/26/2024   Lab Results  Component Value Date   GLUCOSE 130 (H) 06/26/2024   CALCIUM 8.8 (L) 06/26/2024   NA 135 06/26/2024   K 4.1 06/26/2024   CO2 21 (L) 06/26/2024   CL 100 06/26/2024   BUN 20 06/26/2024   CREATININE 1.57 (H) 06/26/2024   Lab Results  Component Value Date   ALT 15 06/26/2024   AST 21 06/26/2024   ALKPHOS 61 06/26/2024   BILITOT 0.6 06/26/2024   No results found for: INR, PROTIME Radiology: DG Foot Complete Left Result Date: 06/26/2024 CLINICAL DATA:  Pressure ulcer. EXAM: LEFT FOOT - COMPLETE 3+ VIEW COMPARISON:  Left foot x-ray 03/07/2021. FINDINGS: There has been interval amputation of the fifth toe and distal aspect of the fifth metatarsal. There is no acute fracture, dislocation or cortical erosion. The bones are osteopenic. There is a plantar calcaneal spur. There is soft tissue swelling of the dorsum of the foot and of the fourth toe. IMPRESSION: 1. Interval amputation of the fifth toe and distal aspect of the fifth metatarsal. 2. Soft tissue swelling of the dorsum of the foot and of the fourth toe 3. No radiographic findings for acute osteomyelitis. Electronically Signed   By: Greig Pique M.D.   On: 06/26/2024 20:27    Assessment and Plan: 42M h/o hypertension, diabetes, legally blind, previous L fifth toe amputation presenting with left fourth toe infection/foot  and found to have OM of LLE 4th  digit and possible abscess or MRI.  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  -Podiatry consulted; apprec eval/recs -IV vancomycin /cefepime  for now -PO tylenol  prn  for pain -F/u blood cultures; if positive, will need TTE  HTN -PTA losartan  50mg  daily  DM2 -Semgless 8U nightly and SSI TID AC prn   Advance Care Planning:   Code Status: Prior   Consults: N/A  Family Communication: Wife  Severity of Illness: The appropriate patient status for this patient is INPATIENT. Inpatient status is judged to be reasonable and necessary in order to provide the required intensity of service to ensure the patient's safety. The patient's presenting symptoms, physical exam findings, and initial radiographic and laboratory data in the context of their chronic comorbidities is felt to place them at high risk for further clinical deterioration. Furthermore, it is not anticipated that the patient will be medically stable for discharge from the hospital within 2 midnights of admission.   * I certify that at the point of admission it is my clinical judgment that the patient will require inpatient hospital care spanning beyond  2 midnights from the point of admission due to high intensity of service, high risk for further deterioration and high frequency of surveillance required.*   ------- I spent 60 minutes reviewing previous notes, at the bedside counseling/discussing the treatment plan, and performing clinical documentation.  Author: Marsha Ada, MD 06/27/2024 7:59 AM  For on call review [email or web address].

## 2024-06-28 DIAGNOSIS — L089 Local infection of the skin and subcutaneous tissue, unspecified: Secondary | ICD-10-CM | POA: Diagnosis not present

## 2024-06-28 LAB — GLUCOSE, CAPILLARY
Glucose-Capillary: 160 mg/dL — ABNORMAL HIGH (ref 70–99)
Glucose-Capillary: 98 mg/dL (ref 70–99)
Glucose-Capillary: 136 mg/dL — ABNORMAL HIGH (ref 70–99)
Glucose-Capillary: 119 mg/dL — ABNORMAL HIGH (ref 70–99)

## 2024-06-28 MED ORDER — SODIUM CHLORIDE 0.9 % IV SOLN: INTRAVENOUS | Status: AC

## 2024-06-28 NOTE — Plan of Care (Signed)

## 2024-06-28 NOTE — Plan of Care (Signed)
   Problem: Coping: Goal: Ability to adjust to condition or change in health will improve Outcome: Progressing   Problem: Fluid Volume: Goal: Ability to maintain a balanced intake and output will improve Outcome: Progressing   Problem: Metabolic: Goal: Ability to maintain appropriate glucose levels will improve Outcome: Progressing

## 2024-06-28 NOTE — Progress Notes (Signed)
 PROGRESS NOTE  Samuel Roth  FMW:[id number] DOB: [birth year] DOA: 06/26/2024 PCP: [name], MD  Consultants  Brief Narrative: 73 y.o. male with medical history significant of hypertension, diabetes, legally blind, previous L fifth toe amputation presenting with left fourth toe infection/foot  and found to have OM of LLE 4th digit and possible abscess or MRI.   The patient presented with a foot ulcer on the LLE 4th digit, having previously undergone amputation of his LLE 5th digit. The ulcer initially appeared on this past Monday, and the patient sought assistance from his home healthcare services the following day. Initially, the ulcer was not considered severe by the healthcare team, but it worsened with increased redness; thus, the NP that was following his care recommended he have ED evaluation.   In the ED, pt initially hypotensive 99/51. Labs notable for Cr 1.57 (unclear baseline; Cr 1.22 in 2022). 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 EDP started IV abx (vancomycin /zosyn  x1) and requested medicine admission.   Assessment & Plan: AKI Presumed pre-renal; Cr 1.22 in 2022 -MIVF: NS at 50cc/h for 24h -Strict I&Os  - Note vanc as antibiotic.  Fortunately creatinine downtrending.  Will continue IV fluids and follow creatinine.   LLE 4th toe cellulitis  Possible osteomyelitis MRI showing osteomyelitis of fourth proximal and middle phalanges.   -Consult orthopedics today.  Dr. Crist practice is on-call and on-call orthopedist plans to communicate with Dr. Gena about this patient tomorrow to be seen while in house. -IV vancomycin /cefepime  for now -PO tylenol  prn for pain -F/u blood cultures; if positive, will  need TTE   HTN - Losartan  initially continued on admission. - Blood pressures have been trending low. - In light of his AKI stop losartan  follow blood pressure.  Bloody bowel movement: -Paged by nursing today that patient had some blood in his stool.  No-sent for culture. Open hemoglobin has been normal.  Will trend his hemoglobin and follow. - Patient reports known history of hemorrhoids which flared up again about a week ago. - Most likely cause of bloody stool above.   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 -Recently evicted from their home and they are now living in a hotel. -Will consult social work to see about any resources available.    DVT prophylaxis:  enoxaparin  (LOVENOX ) injection 40 mg Start: 06/27/24 0900  Code Status:   Code Status: Full Code Level of care: Med-Surg Status is: Inpatient  Consults called: Orthopedics  Subjective: Patient without complaints this morning.  Overall feels well.  Has history of polyneuropathy and therefore toes relatively insensate.  No nausea or vomiting.  Eating and drinking well.  Objective: Vitals:   06/27/24 1541 06/27/24 2200 06/28/24 0200 06/28/24 0858  BP: 105/62 115/62 113/65 103/70  Pulse: 71 64 72 69  Resp: 17 16 16 18   Temp: 98.2 F (36.8 C) 98.1 F (36.7 C) 97.9 F (36.6 C) 98.1 F (36.7 C)  TempSrc: Oral Oral Oral   SpO2: 97% 99% 99% 98%  Weight:      Height:        Intake/Output Summary (Last 24 hours) at 06/28/2024 1430 Last data filed at 06/28/2024 0800 Gross  per 24 hour  Intake 500 ml  Output 1250 ml  Net -750 ml   Filed Weights   06/26/24 1933  Weight: 79.4 kg   Body mass index is 25.85 kg/m.  Gen: 73 y.o. male in no apparent distress.  Nontoxic, chronically ill-appearing.  Wearing dark glasses Pulm: Non-labored breathing.  Clear to auscultation bilaterally.  CV: Regular rate and rhythm.  GI: Abdomen  soft, non-tender, non-distended Ext: Warm.  Right BKA noted.  Left foot with fourth digit wrapped.  Unable to palpate DP/PT pulse Skin: No rashes Neuro: Alert and oriented.  Blind Psych: Calm  Judgement and insight appear normal. Mood & affect appropriate.    I have personally reviewed the following labs and images: CBC: Recent Labs  Lab 06/26/24 1958 06/26/24 2014 06/27/24 1018  WBC  --  8.3 6.1  NEUTROABS  --  6.1  --   HGB 15.0 14.7 14.3  HCT 44.0 44.3 42.1  MCV  --  92.5 91.7  PLT  --  150 147*   BMP &GFR Recent Labs  Lab 06/26/24 1958 06/26/24 2014 06/27/24 1018  NA 137 135  --   K 4.1 4.1  --   CL 102 100  --   CO2  --  21*  --   GLUCOSE 132* 130*  --   BUN 21 20  --   CREATININE 1.50* 1.57* 1.38*  CALCIUM  --  8.8*  --    Estimated Creatinine Clearance: 48.4 mL/min (A) (by C-G formula based on SCr of 1.38 mg/dL (H)). Liver & Pancreas: Recent Labs  Lab 06/26/24 2014  AST 21  ALT 15  ALKPHOS 61  BILITOT 0.6  PROT 7.3  ALBUMIN 3.9   No results for input(s): LIPASE, AMYLASE in the last 168 hours. No results for input(s): AMMONIA in the last 168 hours. Diabetic: Recent Labs    06/27/24 1018  HGBA1C 5.6   Recent Labs  Lab 06/27/24 1643 06/27/24 2017 06/27/24 2206 06/28/24 0718 06/28/24 1216  GLUCAP 185* 200* 117* 160* 98   Cardiac Enzymes: No results for input(s): CKTOTAL, CKMB, CKMBINDEX, TROPONINI in the last 168 hours. No results for input(s): PROBNP in the last 8760 hours. Coagulation Profile: No results for input(s): INR, PROTIME in the last 168 hours. Thyroid Function Tests: No results for input(s): TSH, T4TOTAL, FREET4, T3FREE, THYROIDAB in the last 72 hours. Lipid Profile: No results for input(s): CHOL, HDL, LDLCALC, TRIG, CHOLHDL, LDLDIRECT in the last 72 hours. Anemia Panel: No results for input(s): VITAMINB12, FOLATE, FERRITIN, TIBC, IRON, RETICCTPCT in the last 72 hours. Urine  analysis:    Component Value Date/Time   COLORURINE YELLOW 08/15/2007 0332   APPEARANCEUR CLOUDY (A) 08/15/2007 0332   LABSPEC 1.029 08/15/2007 0332   PHURINE 6.5 08/15/2007 0332   GLUCOSEU 500 (A) 08/15/2007 0332   HGBUR LARGE (A) 08/15/2007 0332   BILIRUBINUR NEGATIVE 08/15/2007 0332   KETONESUR NEGATIVE 08/15/2007 0332   PROTEINUR 30 (A) 08/15/2007 0332   UROBILINOGEN 1.0 08/15/2007 0332   NITRITE NEGATIVE 08/15/2007 0332   LEUKOCYTESUR NEGATIVE 08/15/2007 0332   Sepsis Labs: Invalid input(s): PROCALCITONIN, LACTICIDVEN  Microbiology: Recent Results (from the past 240 hours)  Blood Cultures x 2 sites     Status: None (Preliminary result)   Collection Time: 06/26/24  8:14 PM   Specimen: BLOOD RIGHT ARM  Result Value Ref Range Status   Specimen Description BLOOD RIGHT ARM  Final   Special Requests   Final    BOTTLES DRAWN AEROBIC AND  ANAEROBIC Blood Culture results may not be optimal due to an inadequate volume of blood received in culture bottles   Culture   Final    NO GROWTH 2 DAYS Performed at [facility], [address] [place], [place] [address]    Report Status PENDING  Incomplete  Blood Cultures x 2 sites     Status: None (Preliminary result)   Collection Time: 06/26/24  8:14 PM   Specimen: BLOOD RIGHT HAND  Result Value Ref Range Status   Specimen Description BLOOD RIGHT HAND  Final   Special Requests   Final    BOTTLES DRAWN AEROBIC ONLY Blood Culture results may not be optimal due to an inadequate volume of blood received in culture bottles   Culture   Final    NO GROWTH 2 DAYS Performed at [facility], [address], [place], [place] [address]    Report Status PENDING  Incomplete    Radiology Studies: No results found.  Scheduled Meds:  enoxaparin  (LOVENOX ) injection  40 mg Subcutaneous Q24H   Influenza vac split trivalent PF  0.5 mL Intramuscular Tomorrow-1000   insulin  aspart  0-15 Units Subcutaneous TID WC   insulin  glargine  8  Units Subcutaneous QHS   losartan   50 mg Oral Daily   Continuous Infusions:  ceFEPime  (MAXIPIME ) IV 2 g (06/28/24 0955)   vancomycin  1,250 mg (06/28/24 0225)     LOS: 1 day   35 minutes with more than 50% spent in reviewing records, counseling patient/family and coordinating care.  Reyes VEAR Gaw, MD [facility] [email or web address] 06/28/2024, 2:30 PM

## 2024-06-29 DIAGNOSIS — M869 Osteomyelitis, unspecified: Secondary | ICD-10-CM | POA: Diagnosis not present

## 2024-06-29 DIAGNOSIS — L089 Local infection of the skin and subcutaneous tissue, unspecified: Secondary | ICD-10-CM | POA: Diagnosis not present

## 2024-06-29 LAB — BASIC METABOLIC PANEL WITH GFR
Anion gap: 9 (ref 5–15)
BUN: 16 mg/dL (ref 8–23)
CO2: 22 mmol/L (ref 22–32)
Calcium: 8.2 mg/dL — ABNORMAL LOW (ref 8.9–10.3)
Chloride: 108 mmol/L (ref 98–111)
Creatinine, Ser: 1.14 mg/dL (ref 0.61–1.24)
GFR, Estimated: >60 mL/min (ref >60)
Glucose, Bld: 104 mg/dL — ABNORMAL HIGH (ref 70–99)
Potassium: 4 mmol/L (ref 3.5–5.1)
Sodium: 139 mmol/L (ref 135–145)

## 2024-06-29 LAB — CBC
HCT: 39.4 % (ref 39.0–52.0)
Hemoglobin: 13.4 g/dL (ref 13.0–17.0)
MCH: 31.3 pg (ref 26.0–34.0)
MCHC: 34 g/dL (ref 30.0–36.0)
MCV: 92.1 fL (ref 80.0–100.0)
Platelets: 148 K/uL — ABNORMAL LOW (ref 150–400)
RBC: 4.28 MIL/uL (ref 4.22–5.81)
RDW: 13.3 % (ref 11.5–15.5)
WBC: 4.8 K/uL (ref 4.0–10.5)
nRBC: 0 % (ref 0.0–0.2)

## 2024-06-29 LAB — GLUCOSE, CAPILLARY
Glucose-Capillary: 171 mg/dL — ABNORMAL HIGH (ref 70–99)
Glucose-Capillary: 107 mg/dL — ABNORMAL HIGH (ref 70–99)
Glucose-Capillary: 185 mg/dL — ABNORMAL HIGH (ref 70–99)
Glucose-Capillary: 112 mg/dL — ABNORMAL HIGH (ref 70–99)

## 2024-06-29 MED ORDER — SODIUM CHLORIDE 0.9% FLUSH: 10.0000 mL | INTRAVENOUS | Status: DC | PRN

## 2024-06-29 MED ORDER — SODIUM CHLORIDE 0.9% FLUSH
10.0000 mL | Freq: Two times a day (BID) | INTRAVENOUS | Status: DC
  Administered 2024-06-29 – 2024-06-30 (×3): 10 mL

## 2024-06-29 MED ORDER — SODIUM CHLORIDE 0.9% FLUSH
10.0000 mL | Freq: Two times a day (BID) | INTRAVENOUS | Status: DC
  Administered 2024-06-29 – 2024-07-01 (×3): 10 mL

## 2024-06-29 NOTE — Consult Note (Signed)
 ORTHOPAEDIC CONSULTATION  REQUESTING PHYSICIAN: Elpidio Reyes DEL, MD  Chief Complaint: Cellulitis and ulceration fourth toe left foot.  HPI: Samuel Roth is a 73 y.o. male who presents with progressive ulceration and cellulitis fourth toe left foot.  Past Medical History:  Diagnosis Date   Diabetes mellitus without complication (HCC)    Glaucoma    Legally blind    Past Surgical History:  Procedure Laterality Date   AMPUTATION Right 03/01/2017   Procedure: RIGHT TRANS METATARSAL AMPUTATION;  Surgeon: Harden Jerona GAILS, MD;  Location: [facility] OR;  Service: Orthopedics;  Laterality: Right;   AMPUTATION Right 03/03/2017   Procedure: RIGHT BKA;  Surgeon: Harden Jerona GAILS, MD;  Location: [facility] OR;  Service: Orthopedics;  Laterality: Right;   AMPUTATION TOE Left 03/10/2021   Procedure: AMPUTATION LEFT LITTLE TOE;  Surgeon: Harden Jerona GAILS, MD;  Location: [facility] OR;  Service: Orthopedics;  Laterality: Left;   EYE SURGERY     TIBIA FRACTURE SURGERY     Social History   Socioeconomic History   Marital status: Married    Spouse name: Not on file   Number of children: Not on file   Years of education: Not on file   Highest education level: Not on file  Occupational History   Not on file  Tobacco Use   Smoking status: Former   Smokeless tobacco: Former  Substance and Sexual Activity   Alcohol use: Yes    Comment: social   Drug use: No   Sexual activity: Not on file  Other Topics Concern   Not on file  Social History Narrative   Not on file   Social Drivers of Health   Financial Resource Strain: Not on file  Food Insecurity: No Food Insecurity (06/27/2024)   Hunger Vital Sign    Worried About Running Out of Food in the Last Year: Never true    Ran Out of Food in the Last Year: Never true  Transportation Needs: Unmet Transportation Needs (06/27/2024)   PRAPARE - Administrator, Civil Service (Medical): Yes    Lack of Transportation (Non-Medical): Yes  Physical Activity: Not on  file  Stress: Not on file  Social Connections: Moderately Isolated (06/27/2024)   Social Connection and Isolation Panel    Frequency of Communication with Friends and Family: Three times a week    Frequency of Social Gatherings with Friends and Family: Once a week    Attends Religious Services: Never    Database administrator or Organizations: No    Attends Banker Meetings: Never    Marital Status: Married   History reviewed. No pertinent family history. - negative except otherwise stated in the family history section Allergies  Allergen Reactions   Ciprofloxacin Hives    Patient states he is not allergic to Cipro, maybe only the IV form.   Prior to Admission medications   Medication Sig Start Date End Date Taking? Authorizing Provider  Artificial Tear Solution (GENTEAL TEARS OP) Place 1 drop into both eyes daily as needed (For dry eyes).   Yes [provider]  aspirin 81 MG chewable tablet Chew 81 mg by mouth daily. 12/16/18  Yes [provider]  insulin  aspart (NOVOLOG ) 100 UNIT/ML injection Correction coverage: Moderate (average weight, post-op)  CBG < 70: implement hypoglycemia protocol  CBG 70 - 120: 0 units  CBG 121 - 150: 2 units  CBG 151 - 200: 3 units  CBG 201 - 250: 5 units  CBG 251 - 300: 8 units  CBG 301 - 350: 11 units  CBG 351 - 400: 15 units  CBG > 400 call MD and obtain STAT lab verification Patient taking differently: Inject 2-15 Units into the skin 3 (three) times daily with meals as needed for high blood sugar. 03/05/17  Yes Abrol, Nayana, MD  Insulin  Glargine (BASAGLAR  KWIKPEN) 100 UNIT/ML INJECT 8 UNIT(S) EVERY DAY BY SUBCUTANEOUS ROUTE AT BEDTIME. 02/19/24  Yes [provider]  losartan  (COZAAR ) 50 MG tablet Take 1 tablet by mouth daily. 02/13/21  Yes [provider]   MR FOOT LEFT W WO CONTRAST Result Date: 06/27/2024 CLINICAL DATA:  Osteomyelitis, foot. History of small toe amputation 03/10/2021. EXAM: MRI OF THE  LEFT FOREFOOT WITHOUT AND WITH CONTRAST TECHNIQUE: Multiplanar, multisequence MR imaging of the left forefoot was performed both before and after administration of intravenous contrast. CONTRAST:  7.5mL GADAVIST  GADOBUTROL  1 MMOL/ML IV SOLN COMPARISON:  Radiographs 06/26/2024 and 03/07/2021. FINDINGS: Bones/Joint/Cartilage Postsurgical changes from amputation through the proximal aspect of the 5th metatarsal. The 5th metatarsal remnant is intact without marrow signal abnormality or abnormal enhancement. There are hammertoe deformities with apparent skin ulceration along the dorsal aspect of the 4th proximal interphalangeal joint. Soft tissue findings are further described below. There is extensive abnormal signal throughout the 4th proximal and middle phalanges with decreased T1 signal, increased T2 signal and diffuse marrow enhancement, consistent with osteomyelitis. The 4th metatarsal appears intact. Mild degenerative changes at the 1st metatarsophalangeal joint. The 1st through 3rd rays otherwise appear unremarkable. No significant joint effusions. The alignment is normal at the Lisfranc joint. Ligaments Intact Lisfranc ligament. The collateral ligaments of the remaining metatarsophalangeal joints appear intact. Muscles and Tendons Generalized forefoot muscular atrophy and edema. No focal intramuscular fluid collection identified. Soft tissues As above, apparent dorsal soft tissue ulceration along the 4th proximal interphalangeal joint with an underlying small fluid collection measuring approximately 10 x 9 x 6 mm, best seen on the postcontrast images. This may communicate with the proximal interphalangeal joint. No other focal fluid collections are identified. There is diffuse soft tissue edema and enhancement in the 4th toe. IMPRESSION: 1. Findings are consistent with osteomyelitis of the 4th proximal and middle phalanges. 2. Small fluid collection along the dorsal aspect of the 4th proximal interphalangeal  joint suspicious for a small abscess which may communicate with the proximal interphalangeal joint. 3. No evidence of osteomyelitis in the 5th metatarsal remnant. 4. Generalized forefoot muscular atrophy and edema consistent with chronic denervation. Electronically Signed   By: Elsie Perone M.D.   On: 06/27/2024 11:46   - pertinent xrays, CT, MRI studies were reviewed and independently interpreted  Positive ROS: All other systems have been reviewed and were otherwise negative with the exception of those mentioned in the HPI and as above.  Physical Exam: General: Alert, no acute distress Psychiatric: Patient is competent for consent with normal mood and affect Lymphatic: No axillary or cervical lymphadenopathy Cardiovascular: No pedal edema Respiratory: No cyanosis, no use of accessory musculature GI: No organomegaly, abdomen is soft and non-tender    Images:  @ENCIMAGES @  Labs:  Lab Results  Component Value Date   HGBA1C 5.6 06/27/2024   HGBA1C 6.2 (H) 03/08/2021   HGBA1C QNSTST 03/07/2021   ESRSEDRATE 13 03/07/2021   CRP 29.1 (H) 03/01/2017   REPTSTATUS PENDING 06/26/2024   REPTSTATUS PENDING 06/26/2024   CULT  06/26/2024    NO GROWTH 3 DAYS Performed at [facility] Lab, [address], [place], [place] [address]    CULT  06/26/2024    NO GROWTH 3 DAYS Performed at [facility], [address] [place], [place] [address]     Lab Results  Component Value Date   ALBUMIN 3.9 06/26/2024   ALBUMIN 3.3 (L) 03/10/2021   ALBUMIN 3.2 (L) 03/08/2021        Latest Ref Rng & Units 06/29/2024    4:51 AM 06/27/2024   10:18 AM 06/26/2024    8:14 PM  CBC EXTENDED  WBC 4.0 - 10.5 K/uL 4.8  6.1  8.3   RBC 4.22 - 5.81 MIL/uL 4.28  4.59  4.79   Hemoglobin 13.0 - 17.0 g/dL 86.5  85.6  85.2   HCT 39.0 - 52.0 % 39.4  42.1  44.3   Platelets 150 - 400 K/uL 148  147  150   NEUT# 1.7 - 7.7 K/uL   6.1   Lymph# 0.7 - 4.0 K/uL   1.2     Neurologic: Patient does not have  protective sensation bilateral lower extremities.   MUSCULOSKELETAL:   Skin: Examination patient has sausage digits swelling of the fourth toe with cellulitis left foot there is an ulcer.    Review of the MRI scan shows osteomyelitis of the fourth toe.  Patient has a stable well-healed fifth ray amputation.  Patient has a palpable dorsalis pedis pulse.  Hemoglobin 13.4 white cell count 4.8  Patient has a stable right below-knee amputation.  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  Plan: Plan: Will plan for fourth toe amputation tomorrow Tuesday.  Thank you for the consult and the opportunity to see Mr. Linda Biehn, MD [facility] [phone] 7:49 AM

## 2024-06-29 NOTE — Evaluation (Signed)
 Occupational Therapy Evaluation Patient Details Name: Samuel Roth MRN: [id number] DOB: 13-May-1951 Today's Date: 06/29/2024   History of Present Illness   Pt is a 73 y/o M admitted on 06/26/24 after presenting with c/o L 4th toe infection. 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 Plan for L 4th toe amputation on 06/30/24.  PMH: HTN, DM, legally blind, L 5th toe amputation, R BKA     Clinical Impressions Pt reports ind at baseline with ADLs and uses rollator/2 canes/wheelchair for mobility, lives in a first floor hotel room with spouse who he also helps care for. Pt currently supervision for ADLs, mod I for bed mobility and CGA for transfers with RW. Pt needs navigational cues throughout given visual deficits but also relies on touch for BADLs. Pt educated on heel weightbearing precautions at this time and pt able to adhere to precautions during session. Pt presenting with impairments listed below, will follow acutely. Recommend HHOT at d/c.      If plan is discharge home, recommend the following:   A little help with walking and/or transfers;A little help with bathing/dressing/bathroom;Assistance with cooking/housework;Direct supervision/assist for medications management;Direct supervision/assist for financial management;Assist for transportation;Help with stairs or ramp for entrance     Functional Status Assessment   Patient has had a recent decline in their functional status and demonstrates the ability to make significant improvements in function in a reasonable and predictable amount of time.     Equipment Recommendations   None recommended by OT     Recommendations for Other Services   PT consult     Precautions/Restrictions   Precautions Precautions: Fall Precaution/Restrictions Comments: RLE BKA with prosthesis Restrictions Weight Bearing Restrictions Per Provider Order: Yes Other Position/Activity Restrictions: weightbearing through L heel  only     Mobility Bed Mobility Overal bed mobility: Modified Independent                  Transfers Overall transfer level: Needs assistance Equipment used: Rolling walker (2 wheels) Transfers: Sit to/from Stand Sit to Stand: Contact guard assist                  Balance                                           ADL either performed or assessed with clinical judgement   ADL Overall ADL's : Needs assistance/impaired Eating/Feeding: Set up   Grooming: Set up;Standing;Sitting   Upper Body Bathing: Supervision/ safety   Lower Body Bathing: Supervison/ safety   Upper Body Dressing : Supervision/safety   Lower Body Dressing: Supervision/safety   Toilet Transfer: Supervision/safety   Toileting- Clothing Manipulation and Hygiene: Supervision/safety       Functional mobility during ADLs: Supervision/safety       Vision Baseline Vision/History: 2 Legally blind Additional Comments: sees light but not shadows or figures     Perception Perception: Not tested       Praxis Praxis: Not tested       Pertinent Vitals/Pain Pain Assessment Pain Assessment: No/denies pain     Extremity/Trunk Assessment Upper Extremity Assessment Upper Extremity Assessment: Overall WFL for tasks assessed   Lower Extremity Assessment Lower Extremity Assessment: Defer to PT evaluation RLE Deficits / Details: R BKA with prosthesis   Cervical / Trunk Assessment Cervical / Trunk Assessment: Normal   Communication Communication Communication: No apparent  difficulties   Cognition Arousal: Alert Behavior During Therapy: WFL for tasks assessed/performed Cognition: No apparent impairments             OT - Cognition Comments: tangential in conversation                 Following commands: Intact       Cueing  General Comments   Cueing Techniques: Verbal cues  continent BM on toilet, performs peri hygiene without assistance.    Exercises     Shoulder Instructions      Home Living Family/patient expects to be discharged to:: Private residence Living Arrangements: Spouse/significant other Available Help at Discharge: Family;Available 24 hours/day Type of Home: Other(Comment) (hotel) Home Access: Level entry     Home Layout: One level     Bathroom Shower/Tub: Tub/shower unit         Home Equipment: Tub bench;Rollator (4 wheels);Cane - single point;Wheelchair - manual (2 canes)   Additional Comments: unsure how much wife can assist at d/c      Prior Functioning/Environment               Mobility Comments: ambulatory with either rollator or 2 canes with RLE prosthesis ADLs Comments: mod I for bathing, dressing, donning RLE prosthesis    OT Problem List: Decreased strength;Decreased range of motion;Decreased activity tolerance;Impaired balance (sitting and/or standing)   OT Treatment/Interventions: Self-care/ADL training;Therapeutic exercise;Energy conservation;DME and/or AE instruction;Therapeutic activities;Balance training;Patient/family education      OT Goals(Current goals can be found in the care plan section)   Acute Rehab OT Goals Patient Stated Goal: none stated OT Goal Formulation: With patient Time For Goal Achievement: 07/13/24 Potential to Achieve Goals: Good ADL Goals Pt Will Perform Upper Body Dressing: Independently;sitting Pt Will Perform Lower Body Dressing: Independently;sitting/lateral leans;sit to/from stand Pt Will Transfer to Toilet: Independently;ambulating;regular height toilet Pt Will Perform Tub/Shower Transfer: Tub transfer;Shower transfer;Independently;ambulating;tub bench;rolling walker   OT Frequency:  Min 2X/week    Co-evaluation PT/OT/SLP Co-Evaluation/Treatment: Yes Reason for Co-Treatment: To address functional/ADL transfers;For patient/therapist safety PT goals addressed during session: Mobility/safety with mobility;Balance;Proper use of DME OT  goals addressed during session: ADL's and self-care;Strengthening/ROM      AM-PAC OT 6 Clicks Daily Activity     Outcome Measure Help from another person eating meals?: A Little Help from another person taking care of personal grooming?: A Little Help from another person toileting, which includes using toliet, bedpan, or urinal?: A Little Help from another person bathing (including washing, rinsing, drying)?: A Little Help from another person to put on and taking off regular upper body clothing?: A Little Help from another person to put on and taking off regular lower body clothing?: A Little 6 Click Score: 18   End of Session Equipment Utilized During Treatment: Gait belt;Rolling walker (2 wheels) Nurse Communication: Mobility status  Activity Tolerance: Patient tolerated treatment well Patient left: in chair;with call bell/phone within reach;with chair alarm set  OT Visit Diagnosis: Unsteadiness on feet (R26.81);Other abnormalities of gait and mobility (R26.89);Muscle weakness (generalized) (M62.81)                Time: 8946-8885 OT Time Calculation (min): 21 min Charges:  OT General Charges $OT Visit: 1 Visit OT Evaluation $OT Eval Low Complexity: 1 Low  Laylamarie Meuser K, OTD, OTR/L SecureChat [facility] Re[facility] [phone]   Laneta POUR Koonce 06/29/2024, 11:36 AM

## 2024-06-29 NOTE — Progress Notes (Signed)
 PROGRESS NOTE  Samuel Roth  FMW:[id number] DOB: 1951/02/23 DOA: 06/26/2024 PCP: [name], MD  Consultants  Brief Narrative: 73 y.o. male with medical history significant of hypertension, diabetes, legally blind, previous L fifth toe amputation presenting with left fourth toe infection/foot  and found to have OM of LLE 4th digit and possible abscess on MRI. The patient presented with a foot ulcer on the LLE 4th digit, having previously undergone amputation of his LLE 5th digit. The ulcer initially appeared on this past Monday, and the patient sought assistance from his home healthcare services the following day. Initially, the ulcer was not considered severe by the healthcare team, but it worsened with increased redness; thus, the NP that was following his care recommended he have ED evaluation.   In the ED, pt initially hypotensive 99/51. Labs notable for Cr 1.57 (unclear baseline; Cr 1.22 in 2022). 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 EDP started IV abx (vancomycin /zosyn  x1) and requested medicine admission.   Assessment & Plan: LLE 4th toe cellulitis  Possible osteomyelitis MRI showing osteomyelitis of fourth proximal and middle phalanges.   - Dr. Harden has seen patient.  Recommend surgery possibly as early as tomorrow morning. - Plan to continue IV vancomycin /cefepime  for now -PO tylenol  prn for pain -F/u blood cultures; if positive, will need TTE  AKI Presumed pre-renal; Cr 1.22 in 2022 -MIVF: NS at 50cc/h for 24h -Strict I&Os  - Note vanc as antibiotic.  Fortunately creatinine downtrending and now resolved.  Will trend creatinine   HTN - Losartan  initially continued on admission. - Blood pressures  have been trending low. - In light of his AKI stop losartan  follow blood pressure.  Bloody bowel movement: -Paged by nursing yesterday regarding blood in his stool 9/21. - Hemoglobins remained stable with no further blood. -Culture was sent yesterday but evidently sent down in wrong tube.  No need for further culture unless bleeding returns. -Has history of hemorrhoids likely hemorrhoidal bleeding.   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 -Recently evicted from their home and they are now living in a hotel. -Will consult social work to see about any resources available.  Thrombocytopenia: - Platelets trending downwards. - 148 today, 145 yesterday.  Will continue to trend.  Continuing Lovenox  in the meantime.    DVT prophylaxis:  enoxaparin  (LOVENOX ) injection 40 mg Start: 06/27/24 0900  Code Status:   Code Status: Full Code Level of care: Med-Surg Status is: Inpatient  Consults called: Orthopedics  Subjective: Patient without complaints this morning.  Overall feels well.  Eager to have surgery done so that he can discharge home to care for his wife.  Objective: Vitals:   06/28/24 1506 06/28/24 2000 06/29/24 0200 06/29/24 0734  BP: 136/64 122/75 130/78 107/61  Pulse: 72 67 66 67  Resp: 19 16 16 16   Temp:  98.2 F (36.8 C) 98 F (36.7 C) 98.7 F (37.1 C)  TempSrc:  Oral Oral Oral  SpO2: 100% 100% 100% 100%  Weight:      Height:        Intake/Output Summary (Last 24 hours) at 06/29/2024 1528 Last data filed at 06/29/2024 0915 Gross per 24 hour  Intake 371.43 ml  Output 1501 ml  Net -1129.57 ml   Filed Weights   06/26/24 1933  Weight: 79.4 kg   Body mass index is 25.85 kg/m.  Gen: 73 y.o. male in no apparent distress.  Nontoxic, chronically ill-appearing.  Wearing dark glasses Pulm: Non-labored breathing.  Clear to auscultation bilaterally.  CV: Regular rate and rhythm.   GI: Abdomen soft, non-tender, non-distended Ext: Warm.  Right BKA noted.  Left foot with fourth digit wrapped.  Unable to palpate DP/PT pulse Skin: No rashes Neuro: Alert and oriented.  Blind Psych: Calm  Judgement and insight appear normal. Mood & affect appropriate.    I have personally reviewed the following labs and images: CBC: Recent Labs  Lab 06/26/24 1958 06/26/24 2014 06/27/24 1018 06/29/24 0451  WBC  --  8.3 6.1 4.8  NEUTROABS  --  6.1  --   --   HGB 15.0 14.7 14.3 13.4  HCT 44.0 44.3 42.1 39.4  MCV  --  92.5 91.7 92.1  PLT  --  150 147* 148*   BMP &GFR Recent Labs  Lab 06/26/24 1958 06/26/24 2014 06/27/24 1018 06/29/24 0451  NA 137 135  --  139  K 4.1 4.1  --  4.0  CL 102 100  --  108  CO2  --  21*  --  22  GLUCOSE 132* 130*  --  104*  BUN 21 20  --  16  CREATININE 1.50* 1.57* 1.38* 1.14  CALCIUM  --  8.8*  --  8.2*   Estimated Creatinine Clearance: 58.6 mL/min (by C-G formula based on SCr of 1.14 mg/dL). Liver & Pancreas: Recent Labs  Lab 06/26/24 2014  AST 21  ALT 15  ALKPHOS 61  BILITOT 0.6  PROT 7.3  ALBUMIN 3.9   No results for input(s): LIPASE, AMYLASE in the last 168 hours. No results for input(s): AMMONIA in the last 168 hours. Diabetic: Recent Labs    06/27/24 1018  HGBA1C 5.6   Recent Labs  Lab 06/28/24 1216 06/28/24 1620 06/28/24 2259 06/29/24 0739 06/29/24 1210  GLUCAP 98 136* 119* 171* 107*   Cardiac Enzymes: No results for input(s): CKTOTAL, CKMB, CKMBINDEX, TROPONINI in the last 168 hours. No results for input(s): PROBNP in the last 8760 hours. Coagulation Profile: No results for input(s): INR, PROTIME in the last 168 hours. Thyroid Function Tests: No results for input(s): TSH, T4TOTAL, FREET4, T3FREE, THYROIDAB in the last 72 hours. Lipid Profile: No results for input(s): CHOL, HDL, LDLCALC, TRIG, CHOLHDL, LDLDIRECT in the last 72 hours. Anemia Panel: No results for  input(s): VITAMINB12, FOLATE, FERRITIN, TIBC, IRON, RETICCTPCT in the last 72 hours. Urine analysis:    Component Value Date/Time   COLORURINE YELLOW 08/15/2007 0332   APPEARANCEUR CLOUDY (A) 08/15/2007 0332   LABSPEC 1.029 08/15/2007 0332   PHURINE 6.5 08/15/2007 0332   GLUCOSEU 500 (A) 08/15/2007 0332   HGBUR LARGE (A) 08/15/2007 0332   BILIRUBINUR NEGATIVE 08/15/2007 0332   KETONESUR NEGATIVE 08/15/2007 0332   PROTEINUR 30 (A) 08/15/2007 0332   UROBILINOGEN 1.0 08/15/2007 0332   NITRITE NEGATIVE 08/15/2007 0332   LEUKOCYTESUR NEGATIVE 08/15/2007 0332   Sepsis Labs: Invalid input(s): PROCALCITONIN, LACTICIDVEN  Microbiology: Recent Results (from the past 240 hours)  Blood Cultures x 2 sites     Status: None (Preliminary result)   Collection Time: 06/26/24  8:14 PM   Specimen: BLOOD RIGHT ARM  Result Value Ref Range Status   Specimen Description BLOOD RIGHT ARM  Final  Special Requests   Final    BOTTLES DRAWN AEROBIC AND ANAEROBIC Blood Culture results may not be optimal due to an inadequate volume of blood received in culture bottles   Culture   Final    NO GROWTH 3 DAYS Performed at [facility], [address] [place], [place] [address]    Report Status PENDING  Incomplete  Blood Cultures x 2 sites     Status: None (Preliminary result)   Collection Time: 06/26/24  8:14 PM   Specimen: BLOOD RIGHT HAND  Result Value Ref Range Status   Specimen Description BLOOD RIGHT HAND  Final   Special Requests   Final    BOTTLES DRAWN AEROBIC ONLY Blood Culture results may not be optimal due to an inadequate volume of blood received in culture bottles   Culture   Final    NO GROWTH 3 DAYS Performed at [facility], [address], [place], [place] [address]    Report Status PENDING  Incomplete    Radiology Studies: No results found.  Scheduled Meds:  enoxaparin  (LOVENOX ) injection  40 mg Subcutaneous Q24H   Influenza vac split trivalent PF  0.5  mL Intramuscular Tomorrow-1000   insulin  aspart  0-15 Units Subcutaneous TID WC   insulin  glargine  8 Units Subcutaneous QHS   Continuous Infusions:  sodium chloride  75 mL/hr at 06/29/24 9167   ceFEPime  (MAXIPIME ) IV 2 g (06/29/24 0835)   vancomycin  1,250 mg (06/29/24 0255)     LOS: 2 days   35 minutes with more than 50% spent in reviewing records, counseling patient/family and coordinating care.  Reyes VEAR Gaw, MD [facility] [email or web address] 06/29/2024, 3:28 PM

## 2024-06-29 NOTE — Evaluation (Signed)
 Physical Therapy Evaluation Patient Details Name: Samuel Roth MRN: [id number] DOB: 18-Dec-1950 Today's Date: 06/29/2024  History of Present Illness  Pt is a 73 y/o M admitted on 06/26/24 after presenting with c/o L 4th toe infection. 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 Plan for L 4th toe amputation on 06/30/24.  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 Pt reports prior to admission he primarily used w/c or ambulated with 2 SPCs or rollator, denies falls, notes he recently moved into a hotel with his wife (2/2 bugs in his home) & he assists her. On this date, pt is able to don RLE prosthesis without assistance, ambulate in room/bathroom with RW & CGA, cuing for path finding as pt is legally blind. Educated pt on weight bearing through L heel with fair<>good return demo during session. Recommend ongoing PT services, especially after procedure, to progress transfers, gait with LRAD to reduce fall risk with mobility.        If plan is discharge home, recommend the following: A little help with walking and/or transfers;A little help with bathing/dressing/bathroom;Assistance with cooking/housework;Assist for transportation;Help with stairs or ramp for entrance   Can travel by private vehicle        Equipment Recommendations Rolling walker (2 wheels)  Recommendations for Other Services       Functional Status Assessment Patient has had a recent decline in their functional status and demonstrates the ability to make significant improvements in function in a reasonable and predictable amount of time.     Precautions / Restrictions Precautions Precautions: Fall Precaution/Restrictions Comments: RLE BKA with prosthesis Restrictions Weight Bearing Restrictions Per Provider Order: No LLE Weight Bearing Per Provider Order:  (heel weight bearing in post op shoe after surgery (per secure  chat with Dr. Harden))      Mobility  Bed Mobility Overal bed mobility: Modified Independent                  Transfers Overall transfer level: Needs assistance Equipment used: Rolling walker (2 wheels) Transfers: Sit to/from Stand Sit to Stand: Contact guard assist           General transfer comment: decreased awareness of safe hand placement to push to standing with at least 1UE    Ambulation/Gait Ambulation/Gait assistance: Contact guard assist, Supervision Gait Distance (Feet): 10 Feet (+ 15 ft) Assistive device: Rolling walker (2 wheels) Gait Pattern/deviations: Decreased step length - left, Decreased step length - right, Decreased stride length Gait velocity: decreased     General Gait Details: cuing for heel weight bearing through LLE, cuing for path finding 2/2 decreased vision  Stairs            Wheelchair Mobility     Tilt Bed    Modified Rankin (Stroke Patients Only)       Balance Overall balance assessment: Needs assistance Sitting-balance support: No upper extremity supported, Feet supported Sitting balance-Leahy Scale: Good     Standing balance support: Bilateral upper extremity supported, During functional activity, Reliant on assistive device for balance Standing balance-Leahy Scale: Good Standing balance comment: hand hygiene standing at sink with CGA<>close supervision                             Pertinent Vitals/Pain Pain Assessment Pain Assessment: No/denies pain    Home Living Family/patient  expects to be discharged to::  (hotel) Living Arrangements: Spouse/significant other Available Help at Discharge: Family;Available 24 hours/day Type of Home: Other(Comment) (hotel) Home Access: Level entry       Home Layout: One level Home Equipment: Tub bench;Rollator (4 wheels);Cane - single point;Wheelchair - manual (2 canes) Additional Comments: unsure how much wife can assist at d/c    Prior Function                Mobility Comments: ambulatory with either rollator or 2 canes with RLE prosthesis ADLs Comments: mod I for bathing, dressing, donning RLE prosthesis     Extremity/Trunk Assessment        Lower Extremity Assessment Lower Extremity Assessment: RLE deficits/detail RLE Deficits / Details: R BKA with prosthesis       Communication   Communication Communication: No apparent difficulties    Cognition Arousal: Alert Behavior During Therapy: WFL for tasks assessed/performed   PT - Cognitive impairments: No apparent impairments                         Following commands: Intact       Cueing Cueing Techniques: Verbal cues     General Comments General comments (skin integrity, edema, etc.): continent BM on toilet, performs peri hygiene without assistance.    Exercises     Assessment/Plan    PT Assessment Patient needs continued PT services  PT Problem List Decreased balance;Decreased mobility;Decreased knowledge of precautions;Decreased knowledge of use of DME;Decreased skin integrity       PT Treatment Interventions Balance training;DME instruction;Gait training;Functional mobility training;Neuromuscular re-education;Patient/family education;Therapeutic activities;Therapeutic exercise    PT Goals (Current goals can be found in the Care Plan section)  Acute Rehab PT Goals Patient Stated Goal: L foot get better PT Goal Formulation: With patient Time For Goal Achievement: 07/13/24 Potential to Achieve Goals: Good    Frequency Min 2X/week     Co-evaluation PT/OT/SLP Co-Evaluation/Treatment: Yes Reason for Co-Treatment: To address functional/ADL transfers;For patient/therapist safety PT goals addressed during session: Mobility/safety with mobility;Balance;Proper use of DME         AM-PAC PT 6 Clicks Mobility  Outcome Measure Help needed turning from your back to your side while in a flat bed without using bedrails?: None Help needed moving  from lying on your back to sitting on the side of a flat bed without using bedrails?: None Help needed moving to and from a bed to a chair (including a wheelchair)?: A Little Help needed standing up from a chair using your arms (e.g., wheelchair or bedside chair)?: A Little Help needed to walk in hospital room?: A Little Help needed climbing 3-5 steps with a railing? : A Lot 6 Click Score: 19    End of Session Equipment Utilized During Treatment:  (RLE prosthesis) Activity Tolerance: Patient tolerated treatment well Patient left: in chair;with chair alarm set;with call bell/phone within reach Nurse Communication: Mobility status;Weight bearing status PT Visit Diagnosis: Other abnormalities of gait and mobility (R26.89)    Time: 8947-8885 PT Time Calculation (min) (ACUTE ONLY): 22 min   Charges:   PT Evaluation $PT Eval Moderate Complexity: 1 Mod   PT General Charges $$ ACUTE PT VISIT: 1 Visit         Richerd Pinal, PT, DPT 06/29/24, 11:31 AM   Richerd CHRISTELLA Pinal 06/29/2024, 11:29 AM

## 2024-06-29 NOTE — Plan of Care (Signed)
  Problem: Coping: Goal: Ability to adjust to condition or change in health will improve Outcome: Progressing   Problem: Fluid Volume: Goal: Ability to maintain a balanced intake and output will improve Outcome: Adequate for Discharge   Problem: Health Behavior/Discharge Planning: Goal: Ability to manage health-related needs will improve Outcome: Progressing   Problem: Metabolic: Goal: Ability to maintain appropriate glucose levels will improve Outcome: Progressing

## 2024-06-29 NOTE — H&P (View-Only) (Signed)
 ORTHOPAEDIC CONSULTATION  REQUESTING PHYSICIAN: Elpidio Reyes DEL, MD  Chief Complaint: Cellulitis and ulceration fourth toe left foot.  HPI: Samuel Roth is a 73 y.o. male who presents with progressive ulceration and cellulitis fourth toe left foot.  Past Medical History:  Diagnosis Date   Diabetes mellitus without complication (HCC)    Glaucoma    Legally blind    Past Surgical History:  Procedure Laterality Date   AMPUTATION Right 03/01/2017   Procedure: RIGHT TRANS METATARSAL AMPUTATION;  Surgeon: Harden Jerona GAILS, MD;  Location: [facility] OR;  Service: Orthopedics;  Laterality: Right;   AMPUTATION Right 03/03/2017   Procedure: RIGHT BKA;  Surgeon: Harden Jerona GAILS, MD;  Location: [facility] OR;  Service: Orthopedics;  Laterality: Right;   AMPUTATION TOE Left 03/10/2021   Procedure: AMPUTATION LEFT LITTLE TOE;  Surgeon: Harden Jerona GAILS, MD;  Location: [facility] OR;  Service: Orthopedics;  Laterality: Left;   EYE SURGERY     TIBIA FRACTURE SURGERY     Social History   Socioeconomic History   Marital status: Married    Spouse name: Not on file   Number of children: Not on file   Years of education: Not on file   Highest education level: Not on file  Occupational History   Not on file  Tobacco Use   Smoking status: Former   Smokeless tobacco: Former  Substance and Sexual Activity   Alcohol use: Yes    Comment: social   Drug use: No   Sexual activity: Not on file  Other Topics Concern   Not on file  Social History Narrative   Not on file   Social Drivers of Health   Financial Resource Strain: Not on file  Food Insecurity: No Food Insecurity (06/27/2024)   Hunger Vital Sign    Worried About Running Out of Food in the Last Year: Never true    Ran Out of Food in the Last Year: Never true  Transportation Needs: Unmet Transportation Needs (06/27/2024)   PRAPARE - Administrator, Civil Service (Medical): Yes    Lack of Transportation (Non-Medical): Yes  Physical Activity: Not on  file  Stress: Not on file  Social Connections: Moderately Isolated (06/27/2024)   Social Connection and Isolation Panel    Frequency of Communication with Friends and Family: Three times a week    Frequency of Social Gatherings with Friends and Family: Once a week    Attends Religious Services: Never    Database administrator or Organizations: No    Attends Banker Meetings: Never    Marital Status: Married   History reviewed. No pertinent family history. - negative except otherwise stated in the family history section Allergies  Allergen Reactions   Ciprofloxacin Hives    Patient states he is not allergic to Cipro, maybe only the IV form.   Prior to Admission medications   Medication Sig Start Date End Date Taking? Authorizing Provider  Artificial Tear Solution (GENTEAL TEARS OP) Place 1 drop into both eyes daily as needed (For dry eyes).   Yes [provider]  aspirin 81 MG chewable tablet Chew 81 mg by mouth daily. 12/16/18  Yes [provider]  insulin  aspart (NOVOLOG ) 100 UNIT/ML injection Correction coverage: Moderate (average weight, post-op)  CBG < 70: implement hypoglycemia protocol  CBG 70 - 120: 0 units  CBG 121 - 150: 2 units  CBG 151 - 200: 3 units  CBG 201 - 250: 5 units  CBG 251 - 300: 8 units  CBG 301 - 350: 11 units  CBG 351 - 400: 15 units  CBG > 400 call MD and obtain STAT lab verification Patient taking differently: Inject 2-15 Units into the skin 3 (three) times daily with meals as needed for high blood sugar. 03/05/17  Yes Abrol, Nayana, MD  Insulin  Glargine (BASAGLAR  KWIKPEN) 100 UNIT/ML INJECT 8 UNIT(S) EVERY DAY BY SUBCUTANEOUS ROUTE AT BEDTIME. 02/19/24  Yes [provider]  losartan  (COZAAR ) 50 MG tablet Take 1 tablet by mouth daily. 02/13/21  Yes [provider]   MR FOOT LEFT W WO CONTRAST Result Date: 06/27/2024 CLINICAL DATA:  Osteomyelitis, foot. History of small toe amputation 03/10/2021. EXAM: MRI OF THE  LEFT FOREFOOT WITHOUT AND WITH CONTRAST TECHNIQUE: Multiplanar, multisequence MR imaging of the left forefoot was performed both before and after administration of intravenous contrast. CONTRAST:  7.5mL GADAVIST  GADOBUTROL  1 MMOL/ML IV SOLN COMPARISON:  Radiographs 06/26/2024 and 03/07/2021. FINDINGS: Bones/Joint/Cartilage Postsurgical changes from amputation through the proximal aspect of the 5th metatarsal. The 5th metatarsal remnant is intact without marrow signal abnormality or abnormal enhancement. There are hammertoe deformities with apparent skin ulceration along the dorsal aspect of the 4th proximal interphalangeal joint. Soft tissue findings are further described below. There is extensive abnormal signal throughout the 4th proximal and middle phalanges with decreased T1 signal, increased T2 signal and diffuse marrow enhancement, consistent with osteomyelitis. The 4th metatarsal appears intact. Mild degenerative changes at the 1st metatarsophalangeal joint. The 1st through 3rd rays otherwise appear unremarkable. No significant joint effusions. The alignment is normal at the Lisfranc joint. Ligaments Intact Lisfranc ligament. The collateral ligaments of the remaining metatarsophalangeal joints appear intact. Muscles and Tendons Generalized forefoot muscular atrophy and edema. No focal intramuscular fluid collection identified. Soft tissues As above, apparent dorsal soft tissue ulceration along the 4th proximal interphalangeal joint with an underlying small fluid collection measuring approximately 10 x 9 x 6 mm, best seen on the postcontrast images. This may communicate with the proximal interphalangeal joint. No other focal fluid collections are identified. There is diffuse soft tissue edema and enhancement in the 4th toe. IMPRESSION: 1. Findings are consistent with osteomyelitis of the 4th proximal and middle phalanges. 2. Small fluid collection along the dorsal aspect of the 4th proximal interphalangeal  joint suspicious for a small abscess which may communicate with the proximal interphalangeal joint. 3. No evidence of osteomyelitis in the 5th metatarsal remnant. 4. Generalized forefoot muscular atrophy and edema consistent with chronic denervation. Electronically Signed   By: Elsie Perone M.D.   On: 06/27/2024 11:46   - pertinent xrays, CT, MRI studies were reviewed and independently interpreted  Positive ROS: All other systems have been reviewed and were otherwise negative with the exception of those mentioned in the HPI and as above.  Physical Exam: General: Alert, no acute distress Psychiatric: Patient is competent for consent with normal mood and affect Lymphatic: No axillary or cervical lymphadenopathy Cardiovascular: No pedal edema Respiratory: No cyanosis, no use of accessory musculature GI: No organomegaly, abdomen is soft and non-tender    Images:  @ENCIMAGES @  Labs:  Lab Results  Component Value Date   HGBA1C 5.6 06/27/2024   HGBA1C 6.2 (H) 03/08/2021   HGBA1C QNSTST 03/07/2021   ESRSEDRATE 13 03/07/2021   CRP 29.1 (H) 03/01/2017   REPTSTATUS PENDING 06/26/2024   REPTSTATUS PENDING 06/26/2024   CULT  06/26/2024    NO GROWTH 3 DAYS Performed at [facility], [address], [place], [place] [address]    CULT  06/26/2024    NO GROWTH 3 DAYS Performed at [facility], [address] [place], [place] [address]     Lab Results  Component Value Date   ALBUMIN 3.9 06/26/2024   ALBUMIN 3.3 (L) 03/10/2021   ALBUMIN 3.2 (L) 03/08/2021        Latest Ref Rng & Units 06/29/2024    4:51 AM 06/27/2024   10:18 AM 06/26/2024    8:14 PM  CBC EXTENDED  WBC 4.0 - 10.5 K/uL 4.8  6.1  8.3   RBC 4.22 - 5.81 MIL/uL 4.28  4.59  4.79   Hemoglobin 13.0 - 17.0 g/dL 86.5  85.6  85.2   HCT 39.0 - 52.0 % 39.4  42.1  44.3   Platelets 150 - 400 K/uL 148  147  150   NEUT# 1.7 - 7.7 K/uL   6.1   Lymph# 0.7 - 4.0 K/uL   1.2     Neurologic: Patient does not have  protective sensation bilateral lower extremities.   MUSCULOSKELETAL:   Skin: Examination patient has sausage digits swelling of the fourth toe with cellulitis left foot there is an ulcer.    Review of the MRI scan shows osteomyelitis of the fourth toe.  Patient has a stable well-healed fifth ray amputation.  Patient has a palpable dorsalis pedis pulse.  Hemoglobin 13.4 white cell count 4.8  Patient has a stable right below-knee amputation.  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  Plan: Plan: Will plan for fourth toe amputation tomorrow Tuesday.  Thank you for the consult and the opportunity to see Mr. Linda Biehn, MD [facility] [phone] 7:49 AM

## 2024-06-29 NOTE — TOC CM/SW Note (Signed)
 Transition of Care [facility]) - Inpatient Brief Assessment   Patient Details  Name: DEMETRIOS BYRON MRN: [id number] Date of Birth: 07-01-1951  Transition of Care [facility]) CM/SW Contact:    Lauraine FORBES Saa, LCSWA Phone Number: 06/29/2024, 9:23 AM   Clinical Narrative:  9:23 AM Per chart review, patient resides at home with spouse. Patient has a PCP and insurance. Patient has SNF history with [facility]. Patient has HH history with [facility]. Patient has DME (wheelchair, walker, rollator) history. [facility]'s preferred [facility]'s [phone] [address]. Patient is currently active with [facility] and [facility]. Per bedside RN, patient requested written statement proving visual impairment for [employer] application. CSW consulted MD for further assistance. No other TOC needs identified at this time. TOC will continue to follow and be available to assist.  Transition of Care Asessment: Insurance and Status: Insurance coverage has been reviewed Patient has primary care physician: Yes Home environment has been reviewed: Private Residence Prior level of function:: N/A Prior/Current Home Services: Current home services Social Drivers of Health Review: SDOH reviewed interventions complete Readmission risk has been reviewed: Yes (Currently Green 8%) Transition of care needs: no transition of care needs at this time

## 2024-06-29 NOTE — Progress Notes (Signed)

## 2024-06-30 ENCOUNTER — Inpatient Hospital Stay (HOSPITAL_COMMUNITY): Payer: Self-pay | Admitting: Certified Registered Nurse Anesthetist

## 2024-06-30 ENCOUNTER — Encounter (HOSPITAL_COMMUNITY)
Admission: EM | Disposition: A | Discharge status: Home Health | Payer: Self-pay | Source: Ambulatory Visit | Attending: Family Medicine

## 2024-06-30 ENCOUNTER — Encounter (HOSPITAL_COMMUNITY): Payer: Self-pay | Admitting: Hospitalist

## 2024-06-30 ENCOUNTER — Other Ambulatory Visit: Payer: Self-pay

## 2024-06-30 DIAGNOSIS — E1165 Type 2 diabetes mellitus with hyperglycemia: Secondary | ICD-10-CM

## 2024-06-30 DIAGNOSIS — E1169 Type 2 diabetes mellitus with other specified complication: Secondary | ICD-10-CM

## 2024-06-30 DIAGNOSIS — M869 Osteomyelitis, unspecified: Secondary | ICD-10-CM

## 2024-06-30 DIAGNOSIS — L089 Local infection of the skin and subcutaneous tissue, unspecified: Secondary | ICD-10-CM | POA: Diagnosis not present

## 2024-06-30 HISTORY — PX: AMPUTATION TOE: SHX6595

## 2024-06-30 LAB — BASIC METABOLIC PANEL WITH GFR
Anion gap: 9 (ref 5–15)
BUN: 11 mg/dL (ref 8–23)
CO2: 21 mmol/L — ABNORMAL LOW (ref 22–32)
Calcium: 8 mg/dL — ABNORMAL LOW (ref 8.9–10.3)
Chloride: 108 mmol/L (ref 98–111)
Creatinine, Ser: 1.07 mg/dL (ref 0.61–1.24)
GFR, Estimated: >60 mL/min (ref >60)
Glucose, Bld: 117 mg/dL — ABNORMAL HIGH (ref 70–99)
Potassium: 4.3 mmol/L (ref 3.5–5.1)
Sodium: 138 mmol/L (ref 135–145)

## 2024-06-30 LAB — CBC
HCT: 27.9 % — ABNORMAL LOW (ref 39.0–52.0)
HCT: 39.9 % (ref 39.0–52.0)
Hemoglobin: 9.3 g/dL — ABNORMAL LOW (ref 13.0–17.0)
Hemoglobin: 13.5 g/dL (ref 13.0–17.0)
MCH: 31.7 pg (ref 26.0–34.0)
MCH: 31.3 pg (ref 26.0–34.0)
MCHC: 33.3 g/dL (ref 30.0–36.0)
MCHC: 33.8 g/dL (ref 30.0–36.0)
MCV: 95.2 fL (ref 80.0–100.0)
MCV: 92.4 fL (ref 80.0–100.0)
Platelets: 101 K/uL — ABNORMAL LOW (ref 150–400)
Platelets: 158 K/uL (ref 150–400)
RBC: 2.93 MIL/uL — ABNORMAL LOW (ref 4.22–5.81)
RBC: 4.32 MIL/uL (ref 4.22–5.81)
RDW: 13.2 % (ref 11.5–15.5)
RDW: 13.2 % (ref 11.5–15.5)
WBC: 4.2 K/uL (ref 4.0–10.5)
WBC: 5.5 K/uL (ref 4.0–10.5)
nRBC: 0 % (ref 0.0–0.2)
nRBC: 0 % (ref 0.0–0.2)

## 2024-06-30 LAB — GLUCOSE, CAPILLARY
Glucose-Capillary: 120 mg/dL — ABNORMAL HIGH (ref 70–99)
Glucose-Capillary: 108 mg/dL — ABNORMAL HIGH (ref 70–99)
Glucose-Capillary: 165 mg/dL — ABNORMAL HIGH (ref 70–99)
Glucose-Capillary: 106 mg/dL — ABNORMAL HIGH (ref 70–99)
Glucose-Capillary: 126 mg/dL — ABNORMAL HIGH (ref 70–99)

## 2024-06-30 SURGERY — AMPUTATION, TOE
Anesthesia: Monitor Anesthesia Care | Site: Toe | Laterality: Left

## 2024-06-30 MED ORDER — FENTANYL CITRATE (PF) 250 MCG/5ML IJ SOLN
INTRAMUSCULAR | Status: AC
  Filled 2024-06-30: qty 5

## 2024-06-30 MED ORDER — LIDOCAINE 2% (20 MG/ML) 5 ML SYRINGE
INTRAMUSCULAR | Status: AC
  Filled 2024-06-30: qty 5

## 2024-06-30 MED ORDER — DEXAMETHASONE SODIUM PHOSPHATE 10 MG/ML IJ SOLN
INTRAMUSCULAR | Status: AC
  Filled 2024-06-30: qty 1

## 2024-06-30 MED ORDER — ROCURONIUM BROMIDE 10 MG/ML (PF) SYRINGE
PREFILLED_SYRINGE | INTRAVENOUS | Status: AC
  Filled 2024-06-30: qty 10

## 2024-06-30 MED ORDER — LIDOCAINE HCL (PF) 1 % IJ SOLN
INTRAMUSCULAR | Status: DC | PRN
  Administered 2024-06-30: 10 mL

## 2024-06-30 MED ORDER — LACTATED RINGERS IV SOLN: INTRAVENOUS | Status: DC

## 2024-06-30 MED ORDER — BUPIVACAINE HCL (PF) 0.5 % IJ SOLN
INTRAMUSCULAR | Status: AC
  Filled 2024-06-30: qty 30

## 2024-06-30 MED ORDER — INSULIN ASPART 100 UNIT/ML IJ SOLN: 0.0000 [IU] | INTRAMUSCULAR | Status: DC | PRN

## 2024-06-30 MED ORDER — PROPOFOL 10 MG/ML IV BOLUS
INTRAVENOUS | Status: AC
  Filled 2024-06-30: qty 20

## 2024-06-30 MED ORDER — ONDANSETRON HCL 4 MG/2ML IJ SOLN
INTRAMUSCULAR | Status: AC
  Filled 2024-06-30: qty 2

## 2024-06-30 MED ORDER — PROPOFOL 10 MG/ML IV BOLUS
INTRAVENOUS | Status: DC | PRN
  Administered 2024-06-30: 20 mg via INTRAVENOUS
  Administered 2024-06-30: 30 mg via INTRAVENOUS

## 2024-06-30 MED ORDER — 0.9 % SODIUM CHLORIDE (POUR BTL) OPTIME
TOPICAL | Status: DC | PRN
  Administered 2024-06-30: 1000 mL

## 2024-06-30 MED ORDER — LIDOCAINE 2% (20 MG/ML) 5 ML SYRINGE
INTRAMUSCULAR | Status: DC | PRN
  Administered 2024-06-30: 100 mg via INTRAVENOUS

## 2024-06-30 MED ORDER — ORAL CARE MOUTH RINSE: 15.0000 mL | Freq: Once | OROMUCOSAL | Status: AC

## 2024-06-30 MED ORDER — CHLORHEXIDINE GLUCONATE 0.12 % MT SOLN
15.0000 mL | Freq: Once | OROMUCOSAL | Status: AC
  Administered 2024-06-30: 15 mL via OROMUCOSAL
  Filled 2024-06-30: qty 15

## 2024-06-30 MED ORDER — LIDOCAINE HCL (PF) 1 % IJ SOLN
INTRAMUSCULAR | Status: AC
  Filled 2024-06-30: qty 30

## 2024-06-30 MED ORDER — PROPOFOL 500 MG/50ML IV EMUL
INTRAVENOUS | Status: DC | PRN
  Administered 2024-06-30: 50 ug/kg/min via INTRAVENOUS

## 2024-06-30 SURGICAL SUPPLY — 27 items
BLADE SURG 21 STRL SS (BLADE) ×1 IMPLANT
BNDG COHESIVE 4X5 TAN STRL LF (GAUZE/BANDAGES/DRESSINGS) ×1 IMPLANT
BNDG GAUZE DERMACEA FLUFF 4 (GAUZE/BANDAGES/DRESSINGS) ×1 IMPLANT
COVER SURGICAL LIGHT HANDLE (MISCELLANEOUS) ×2 IMPLANT
DRAPE U-SHAPE 47X51 STRL (DRAPES) ×2 IMPLANT
DRSG ADAPTIC 3X8 NADH LF (GAUZE/BANDAGES/DRESSINGS) ×1 IMPLANT
DURAPREP 26ML APPLICATOR (WOUND CARE) ×1 IMPLANT
ELECTRODE REM PT RTRN 9FT ADLT (ELECTROSURGICAL) ×1 IMPLANT
GAUZE PAD ABD 8X10 STRL (GAUZE/BANDAGES/DRESSINGS) ×2 IMPLANT
GAUZE SPONGE 4X4 12PLY STRL (GAUZE/BANDAGES/DRESSINGS) ×1 IMPLANT
GLOVE BIOGEL PI IND STRL 9 (GLOVE) ×1 IMPLANT
GLOVE SURG ORTHO 9.0 STRL STRW (GLOVE) ×1 IMPLANT
GOWN STRL REUS W/ TWL XL LVL3 (GOWN DISPOSABLE) ×2 IMPLANT
KIT BASIN OR (CUSTOM PROCEDURE TRAY) ×1 IMPLANT
KIT TURNOVER KIT B (KITS) ×1 IMPLANT
NDL HYPO 25GX1X1/2 BEV (NEEDLE) IMPLANT
NEEDLE HYPO 25GX1X1/2 BEV (NEEDLE) ×1 IMPLANT
NS IRRIG 1000ML POUR BTL (IV SOLUTION) ×1 IMPLANT
PACK ORTHO EXTREMITY (CUSTOM PROCEDURE TRAY) ×1 IMPLANT
PAD ARMBOARD POSITIONER FOAM (MISCELLANEOUS) ×2 IMPLANT
PENCIL SMOKE EVACUATOR (MISCELLANEOUS) IMPLANT
SUT ETHILON 2 0 PSLX (SUTURE) ×1 IMPLANT
SUT SILK 2-0 18XBRD TIE 12 (SUTURE) IMPLANT
SYR CONTROL 10ML LL (SYRINGE) IMPLANT
TOWEL GREEN STERILE (TOWEL DISPOSABLE) ×1 IMPLANT
TUBE CONNECTING 12X1/4 (SUCTIONS) ×1 IMPLANT
YANKAUER SUCT BULB TIP NO VENT (SUCTIONS) ×1 IMPLANT

## 2024-06-30 NOTE — Plan of Care (Signed)

## 2024-06-30 NOTE — Interval H&P Note (Signed)
 History and Physical Interval Note:  06/30/2024 6:38 AM  Samuel Roth  has presented today for surgery, with the diagnosis of Osteomyelitis 4th Toe Left Foot.  The various methods of treatment have been discussed with the patient and family. After consideration of risks, benefits and other options for treatment, the patient has consented to  Procedure(s) with comments: AMPUTATION, TOE (Left) - LEFT FOOT 4TH TOE AMPUTATION as a surgical intervention.  The patient's history has been reviewed, patient examined, no change in status, stable for surgery.  I have reviewed the patient's chart and labs.  Questions were answered to the patient's satisfaction.     Emmaline Wahba V Kari Montero

## 2024-06-30 NOTE — Progress Notes (Signed)
 Pt off unit to OR at this time. Attempted to call report to CRNA no answer.

## 2024-06-30 NOTE — Progress Notes (Signed)
 Patient requested his wife be notified that he was in pre-op. Called wife and notified patient that she was aware.

## 2024-06-30 NOTE — Anesthesia Postprocedure Evaluation (Signed)
 Anesthesia Post Note  Patient: Samuel Roth  Procedure(s) Performed: LEFT FOOT 4TH TOE AMPUTATION (Left: Toe)     Patient location during evaluation: PACU Anesthesia Type: MAC Level of consciousness: awake and alert Pain management: pain level controlled Vital Signs Assessment: post-procedure vital signs reviewed and stable Respiratory status: spontaneous breathing, nonlabored ventilation and respiratory function stable Cardiovascular status: blood pressure returned to baseline and stable Postop Assessment: no apparent nausea or vomiting Anesthetic complications: no   No notable events documented.  Last Vitals:  Vitals:   06/30/24 0800 06/30/24 0844  BP: 118/63 127/70  Pulse: 70 (!) 57  Resp: 16 19  Temp: 36.6 C 36.7 C  SpO2: 99% 99%    Last Pain:  Vitals:   06/30/24 0844  TempSrc: Oral  PainSc: 0-No pain                 Butler Levander Pinal

## 2024-06-30 NOTE — TOC Initial Note (Addendum)
 Transition of Care [facility]) - Initial/Assessment Note    Patient Details  Name: Samuel Roth MRN: [id number] Date of Birth: May 29, 1951  Transition of Care [facility]) CM/SW Contact:    Roxie KANDICE Stain, RN Phone Number: 06/30/2024, 10:19 AM  Clinical Narrative:                 Spoke to patient regarding transition needs.   Patient currently lives in [place] with wife.   Patient requesting [place] ACCESS application be completed. This RNCM sent application via internet 06/29/24.   Notified Dr, Elpidio of needed note stating he is legally blind a has mobility issues and needs WC transporation.  Orders for home health. This RNCM offered choice for Home Health, SEBRON MCMAHILL states he has no preference, RNCM made referral to [facility] with [facility], She is able to take referral.  Patient states his Friend can transport him home.  Address : [address] [place], [place] [address]  Address, Phone number and PCP verified.  Notified [name] with adapt of walker order.  Emailed the MD's note to [facility] [place].  Expected Discharge Plan: Home w Home Health Services Barriers to Discharge: Continued Medical Work up   Patient Goals and CMS Choice Patient states their goals for this hospitalization and ongoing recovery are:: return home with wife CMS Medicare.gov Compare Post Acute Care list provided to:: Patient Choice offered to / list presented to : Patient      Expected Discharge Plan and Services   Discharge Planning Services: CM Consult, Other - See comment ([facility] [place] appilaction sent) Post Acute Care Choice: Home Health Living arrangements for the past 2 months: Hotel/Motel                           HH Arranged: PT, OT HH Agency: [facility] Date [facility] Agency Contacted: 06/30/24 Time HH Agency Contacted: 1017 Representative spoke with at [facility] Agency: [name]  Prior Living Arrangements/Services Living arrangements for the past 2 months: Hotel/Motel Lives with:: Spouse   Do  you feel safe going back to the place where you live?: Yes      Need for Family Participation in Patient Care: Yes (Comment) Care giver support system in place?: Yes (comment) Current home services: DME Criminal Activity/Legal Involvement Pertinent to Current Situation/Hospitalization: No - Comment as needed  Activities of Daily Living   ADL Screening (condition at time of admission) Independently performs ADLs?: No Does the patient have a NEW difficulty with bathing/dressing/toileting/self-feeding that is expected to last >3 days?: Yes (Initiates electronic notice to provider for possible OT consult) (not new-blind) Does the patient have a NEW difficulty with getting in/out of bed, walking, or climbing stairs that is expected to last >3 days?: Yes (Initiates electronic notice to provider for possible PT consult) (not new blind) Does the patient have a NEW difficulty with communication that is expected to last >3 days?: Yes (Initiates electronic notice to provider for possible SLP consult) (not new blind) Is the patient deaf or have difficulty hearing?: Yes Does the patient have difficulty seeing, even when wearing glasses/contacts?: Yes Does the patient have difficulty concentrating, remembering, or making decisions?: No  Permission Sought/Granted Permission sought to share information with : Facility Industrial/product designer granted to share information with : Yes, Verbal Permission Granted     Permission granted to share info w AGENCY: [place] access, home health        Emotional Assessment Appearance:: Appears  stated age Attitude/Demeanor/Rapport: Gracious, Engaged Affect (typically observed): Accepting Orientation: : Oriented to Self, Oriented to Place, Oriented to  Time, Oriented to Situation Alcohol / Substance Use: Not Applicable Psych Involvement: No (comment)  Admission diagnosis:  Wound infection [T14.8XXA, L08.9] Toe infection [L08.9] Cellulitis, unspecified  cellulitis site [L03.90] Patient Active Problem List   Diagnosis Date Noted   Osteomyelitis of fourth toe of left foot (HCC) 06/29/2024   Toe infection 06/27/2024   Toe trauma, left, initial encounter    Chronic osteomyelitis of toe of left foot (HCC)    Abscess of left great toe    Cellulitis of left foot    Acquired absence of right leg below knee (HCC) 03/13/2017   Diabetic polyneuropathy associated with type 1 diabetes mellitus (HCC)    Diabetes mellitus, new onset (HCC)    Acute renal failure    Acute kidney injury 02/27/2017   Hyperglycemia 02/27/2017   Leukocytosis 02/27/2017   PCP:  [name], MD Pharmacy:   [id number] [place], [place] - [address] [place] [address] Phone: [phone] Fax: [phone]  [facility] [address] [facility] [place], [place] [facility] [address] [facility] [address] [place] [address] Phone: [phone] Fax: [phone]     Social Drivers of Health (SDOH) Social History: SDOH Screenings   Food Insecurity: No Food Insecurity (06/27/2024)  Housing: Low Risk  (06/27/2024)  Transportation Needs: Unmet Transportation Needs (06/27/2024)  Utilities: Not At Risk (06/27/2024)  Social Connections: Moderately Isolated (06/27/2024)  Tobacco Use: Medium Risk (06/30/2024)   SDOH Interventions: Transportation Interventions: SCAT (Specialized Community Area Transporation), Inpatient TOC   Readmission Risk Interventions     No data to display

## 2024-06-30 NOTE — Transfer of Care (Signed)
 Immediate Anesthesia Transfer of Care Note  Patient: Samuel Roth  Procedure(s) Performed: LEFT FOOT 4TH TOE AMPUTATION (Left: Toe)  Patient Location: PACU  Anesthesia Type:MAC  Level of Consciousness: awake, alert , and oriented  Airway & Oxygen Therapy: Patient Spontanous Breathing  Post-op Assessment: Report given to RN and Post -op Vital signs reviewed and stable  Post vital signs: Reviewed and stable  Last Vitals:  Vitals Value Taken Time  BP 108/57 06/30/24 07:46  Temp    Pulse 68 06/30/24 07:48  Resp 12 06/30/24 07:48  SpO2 99 % 06/30/24 07:48  Vitals shown include unfiled device data.  Last Pain:  Vitals:   06/30/24 0658  TempSrc:   PainSc: 0-No pain      Patients Stated Pain Goal: 0 (06/28/24 2348)  Complications: No notable events documented.

## 2024-06-30 NOTE — Care Management Important Message (Signed)
 Important Message  Patient Details  Name: Samuel Roth MRN: [id number] Date of Birth: 10-Dec-1950   Important Message Given:  Yes - Medicare IM     Claretta Deed 06/30/2024, 2:46 PM

## 2024-06-30 NOTE — Op Note (Signed)
 06/30/2024  7:50 AM  PATIENT:  Samuel Roth    PRE-OPERATIVE DIAGNOSIS:  Osteomyelitis 4th Toe Left Foot  POST-OPERATIVE DIAGNOSIS:  Same  PROCEDURE:  LEFT FOOT 4TH ray amputation. Local tissue transfer for wound closure 2 x 6 cm.  SURGEON:  Jerona LULLA Sage, MD  PHYSICIAN ASSISTANT:None ANESTHESIA:   General  PREOPERATIVE INDICATIONS:  Samuel Roth is a  73 y.o. male with a diagnosis of Osteomyelitis 4th Toe Left Foot who failed conservative measures and elected for surgical management.    The risks benefits and alternatives were discussed with the patient preoperatively including but not limited to the risks of infection, bleeding, nerve injury, cardiopulmonary complications, the need for revision surgery, among others, and the patient was willing to proceed.  OPERATIVE IMPLANTS:   * No implants in log *  @ENCIMAGES @  OPERATIVE FINDINGS: Tissue margins were clear.  Minimal petechial bleeding.  OPERATIVE PROCEDURE: Patient is brought the operating room and underwent a MAC anesthetic.  The left lower extremity was prepped using DuraPrep draped into a sterile field a timeout was called.  A 21 blade knife was used to excise the ulcerative tissue and the fourth toe.  This left the wound that was 2 x 6 cm.  The tissue margins were undermined for allow for local tissue transfer.  The metatarsal head was prominent and the metatarsal head was also resected.  The wound was irrigated with normal saline electrocautery was used hemostasis.  After undermining the tissue margins local tissue transfer was used to close the wound 2 x 6 cm.  Incision was closed using 2-0 nylon.  A sterile dressing was applied patient was taken the PACU in stable condition.   DISCHARGE PLANNING:  Antibiotic duration: Continue antibiotics for 24 hours  Weightbearing: Touchdown weightbearing on the left ideally however patient may place full weightbearing through the heel.  Pain medication: Opioid pathway  Dressing  care/ Wound VAC: Dry dressing change and follow-up in the office  Ambulatory devices: Walker  Discharge to: Discharge to home.  Follow-up: In the office 1 week post operative.

## 2024-06-30 NOTE — Progress Notes (Signed)
 Lab draw by midline/IV team was contaminated or misdraw.  Redraw order placed for phlebotomy on BMP following drastic drop/critical value in potassium and calcium.  Redraw done by phlebotomy-No critical lab values.   No redraw was done on CBC, possible that a redraw for this could explain drop in hgb as well.

## 2024-06-30 NOTE — Progress Notes (Signed)
 PROGRESS NOTE  Samuel Roth  FMW:[id number] DOB: 1951-10-06 DOA: 06/26/2024 PCP: [name], MD  Consultants  Brief Narrative: 73 y.o. male with medical history significant of hypertension, diabetes, legally blind, previous L fifth toe amputation presenting with left fourth toe infection/foot  and found to have OM of LLE 4th digit and possible abscess on MRI. The patient presented with a foot ulcer on the LLE 4th digit, having previously undergone amputation of his LLE 5th digit. The ulcer initially appeared on this past Monday, and the patient sought assistance from his home healthcare services the following day. Initially, the ulcer was not considered severe by the healthcare team, but it worsened with increased redness; thus, the NP that was following his care recommended he have ED evaluation.   In the ED, pt initially hypotensive 99/51. Labs notable for Cr 1.57 (unclear baseline; Cr 1.22 in 2022). 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 EDP started IV abx (vancomycin /zosyn  x1) and requested medicine admission.  Now s/p 4th Ray amputation.     Assessment & Plan: LLE 4th toe cellulitis  Possible osteomyelitis MRI showing osteomyelitis of fourth proximal and middle phalanges.   - Now status post fourth ray amputation left foot today 9/23. - Plan is to continue antibiotics x 24 more hours. - Awaiting PT/OT consult to ensure safe discharge to home. - Patient himself reports he is unable to receive a ride home today.  Plan therefore will be to continue his IV antibiotics x 1 more day, then stop, and if cleared with PT/OT can discharge to home tomorrow 9/24. -Of note patient currently lives in a hotel with his  wife.  AKI Presumed pre-renal; Cr 1.22 in 2022 -MIVF: NS at 50cc/h for 24h - Now resolved, DC IV fluids.   HTN - Losartan  initially continued on admission. - Blood pressures have been trending low. - In light of his AKI stop losartan  follow blood pressure.  Bloody bowel movement: -Paged by nursing yesterday regarding blood in his stool 9/21. - Hemoglobins remained stable with no further blood. - There was a question of a sudden drop in hemoglobin based on lab but looks like BMP lab was also spurious so likely lab error for the hemoglobin of 9.3 early a.m. 9/23.  Regardless his repeat hemoglobin today was normal.   DM2 -Semgless 8U nightly and SSI TID AC prn - CBGs good  Social issues: - Despite being blind, patient is primary caregiver for his wife. -Recently evicted from their home and they are now living in a hotel. - Needs a note declaring him blind.  Will write in a separate progress note today.  Thrombocytopenia: - Platelets trending downwards. - Now resolved.     DVT prophylaxis:  enoxaparin  (LOVENOX ) injection 40 mg Start: 06/27/24 0900  Code Status:   Code Status: Full Code Level of care: Med-Surg Status is: Inpatient Dispo: Likely able to discharge back to hotel tomorrow, pending PT/OT evaluation  Consults called: Orthopedics  Subjective: Patient without complaints this morning.  Seen after surgery, no pain currently.  Eating breakfast  Objective: Vitals:   06/30/24 0640 06/30/24 0745 06/30/24 0800 06/30/24 0844  BP: (!) 149/73 (!) 108/57 118/63 127/70  Pulse: 75 69 70 (!) 57  Resp: 20 15 16 19   Temp: 98.7  F (37.1 C) 97.8 F (36.6 C) 97.8 F (36.6 C) 98.1 F (36.7 C)  TempSrc:    Oral  SpO2: 98% 98% 99% 99%  Weight: 79.4 kg     Height: 5' 9 (1.753 m)       Intake/Output Summary (Last 24 hours) at 06/30/2024 1219 Last data filed at 06/30/2024 1100 Gross per 24 hour  Intake 1610.43 ml  Output 1360 ml  Net 250.43 ml   Filed Weights    06/26/24 1933 06/30/24 0640  Weight: 79.4 kg 79.4 kg   Body mass index is 25.85 kg/m.  Gen: 73 y.o. male in no apparent distress.  Nontoxic, chronically ill-appearing.  Wearing dark glasses Pulm: Non-labored breathing.  Clear to auscultation bilaterally.  CV: Regular rate and rhythm.  GI: Abdomen soft, non-tender, non-distended Ext: Warm.  Right BKA noted.  Left foot with now with chlorhexidine  staining and bandage in place clean/dry/intact. Skin: No rashes Neuro: Alert and oriented.  Blind Psych: Calm  Judgement and insight appear normal. Mood & affect appropriate.    I have personally reviewed the following labs and images: CBC: Recent Labs  Lab 06/26/24 2014 06/27/24 1018 06/29/24 0451 06/30/24 0206 06/30/24 0922  WBC 8.3 6.1 4.8 4.2 5.5  NEUTROABS 6.1  --   --   --   --   HGB 14.7 14.3 13.4 9.3* 13.5  HCT 44.3 42.1 39.4 27.9* 39.9  MCV 92.5 91.7 92.1 95.2 92.4  PLT 150 147* 148* 101* 158   BMP &GFR Recent Labs  Lab 06/26/24 1958 06/26/24 2014 06/27/24 1018 06/29/24 0451 06/30/24 0529  NA 137 135  --  139 138  K 4.1 4.1  --  4.0 4.3  CL 102 100  --  108 108  CO2  --  21*  --  22 21*  GLUCOSE 132* 130*  --  104* 117*  BUN 21 20  --  16 11  CREATININE 1.50* 1.57* 1.38* 1.14 1.07  CALCIUM  --  8.8*  --  8.2* 8.0*   Estimated Creatinine Clearance: 62.4 mL/min (by C-G formula based on SCr of 1.07 mg/dL). Liver & Pancreas: Recent Labs  Lab 06/26/24 2014  AST 21  ALT 15  ALKPHOS 61  BILITOT 0.6  PROT 7.3  ALBUMIN 3.9   No results for input(s): LIPASE, AMYLASE in the last 168 hours. No results for input(s): AMMONIA in the last 168 hours. Diabetic: No results for input(s): HGBA1C in the last 72 hours.  Recent Labs  Lab 06/29/24 1611 06/29/24 2052 06/30/24 0647 06/30/24 0752 06/30/24 1150  GLUCAP 185* 112* 120* 108* 165*   Cardiac Enzymes: No results for input(s): CKTOTAL, CKMB, CKMBINDEX, TROPONINI in the last 168 hours. No results  for input(s): PROBNP in the last 8760 hours. Coagulation Profile: No results for input(s): INR, PROTIME in the last 168 hours. Thyroid Function Tests: No results for input(s): TSH, T4TOTAL, FREET4, T3FREE, THYROIDAB in the last 72 hours. Lipid Profile: No results for input(s): CHOL, HDL, LDLCALC, TRIG, CHOLHDL, LDLDIRECT in the last 72 hours. Anemia Panel: No results for input(s): VITAMINB12, FOLATE, FERRITIN, TIBC, IRON, RETICCTPCT in the last 72 hours. Urine analysis:    Component Value Date/Time   COLORURINE YELLOW 08/15/2007 0332   APPEARANCEUR CLOUDY (A) 08/15/2007 0332   LABSPEC 1.029 08/15/2007 0332   PHURINE 6.5 08/15/2007 0332   GLUCOSEU 500 (A) 08/15/2007 0332   HGBUR LARGE (A) 08/15/2007 0332   BILIRUBINUR NEGATIVE 08/15/2007 0332   KETONESUR NEGATIVE 08/15/2007 0332   PROTEINUR  30 (A) 08/15/2007 0332   UROBILINOGEN 1.0 08/15/2007 0332   NITRITE NEGATIVE 08/15/2007 0332   LEUKOCYTESUR NEGATIVE 08/15/2007 0332   Sepsis Labs: Invalid input(s): PROCALCITONIN, LACTICIDVEN  Microbiology: Recent Results (from the past 240 hours)  Blood Cultures x 2 sites     Status: None (Preliminary result)   Collection Time: 06/26/24  8:14 PM   Specimen: BLOOD RIGHT ARM  Result Value Ref Range Status   Specimen Description BLOOD RIGHT ARM  Final   Special Requests   Final    BOTTLES DRAWN AEROBIC AND ANAEROBIC Blood Culture results may not be optimal due to an inadequate volume of blood received in culture bottles   Culture   Final    NO GROWTH 4 DAYS Performed at [facility], [address], [place], [place] [address]    Report Status PENDING  Incomplete  Blood Cultures x 2 sites     Status: None (Preliminary result)   Collection Time: 06/26/24  8:14 PM   Specimen: BLOOD RIGHT HAND  Result Value Ref Range Status   Specimen Description BLOOD RIGHT HAND  Final   Special Requests   Final    BOTTLES DRAWN AEROBIC ONLY Blood Culture  results may not be optimal due to an inadequate volume of blood received in culture bottles   Culture   Final    NO GROWTH 4 DAYS Performed at [facility], [address] [place], [place] [address]    Report Status PENDING  Incomplete    Radiology Studies: No results found.  Scheduled Meds:  enoxaparin  (LOVENOX ) injection  40 mg Subcutaneous Q24H   Influenza vac split trivalent PF  0.5 mL Intramuscular Tomorrow-1000   insulin  aspart  0-15 Units Subcutaneous TID WC   insulin  glargine  8 Units Subcutaneous QHS   sodium chloride  flush  10-40 mL Intracatheter Q12H   sodium chloride  flush  10-40 mL Intracatheter Q12H   Continuous Infusions:  ceFEPime  (MAXIPIME ) IV 2 g (06/30/24 0856)     LOS: 3 days   35 minutes with more than 50% spent in reviewing records, counseling patient/family and coordinating care.  Reyes VEAR Gaw, MD [facility] [email or web address] 06/30/2024, 12:19 PM

## 2024-06-30 NOTE — Anesthesia Preprocedure Evaluation (Addendum)
 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   Neuro/Psych Diabetic polyneuropathy Glaucoma Legally blind  Neuromuscular disease  negative psych ROS   GI/Hepatic negative GI ROS, Neg liver ROS,,,  Endo/Other  diabetes, Well Controlled, Type 2, Insulin  Dependent  Hyperlipidemia  Renal/GU Renal InsufficiencyRenal disease  negative genitourinary   Musculoskeletal Osteomyelitis left 5th toe S/P Right BKA   Abdominal   Peds  Hematology negative hematology ROS (+)   Anesthesia Other Findings   Reproductive/Obstetrics                              Anesthesia Physical Anesthesia Plan  ASA: III  Anesthesia Plan: MAC   Post-op Pain Management:    Induction: Intravenous  PONV Risk Score and Plan: 1 and Treatment may vary due to age or medical condition and Ondansetron   Airway Management Planned: Simple Face Mask  Additional Equipment:   Intra-op Plan:   Post-operative Plan:   Informed Consent: I have reviewed the patients History and Physical, chart, labs and discussed the procedure including the risks, benefits and alternatives for the proposed anesthesia with the patient or authorized representative who has indicated his/her understanding and acceptance.     Dental advisory given  Plan Discussed with: CRNA and  Anesthesiologist  Anesthesia Plan Comments:          Anesthesia Quick Evaluation

## 2024-07-01 ENCOUNTER — Encounter (HOSPITAL_COMMUNITY): Payer: Self-pay | Admitting: Orthopedic Surgery

## 2024-07-01 DIAGNOSIS — L089 Local infection of the skin and subcutaneous tissue, unspecified: Secondary | ICD-10-CM | POA: Diagnosis not present

## 2024-07-01 LAB — GLUCOSE, CAPILLARY
Glucose-Capillary: 237 mg/dL — ABNORMAL HIGH (ref 70–99)
Glucose-Capillary: 92 mg/dL (ref 70–99)
Glucose-Capillary: 118 mg/dL — ABNORMAL HIGH (ref 70–99)

## 2024-07-01 LAB — BASIC METABOLIC PANEL WITH GFR
Anion gap: 10 (ref 5–15)
BUN: 15 mg/dL (ref 8–23)
CO2: 24 mmol/L (ref 22–32)
Calcium: 8.3 mg/dL — ABNORMAL LOW (ref 8.9–10.3)
Chloride: 105 mmol/L (ref 98–111)
Creatinine, Ser: 1.02 mg/dL (ref 0.61–1.24)
GFR, Estimated: >60 mL/min (ref >60)
Glucose, Bld: 118 mg/dL — ABNORMAL HIGH (ref 70–99)
Potassium: 4.1 mmol/L (ref 3.5–5.1)
Sodium: 139 mmol/L (ref 135–145)

## 2024-07-01 LAB — CBC
HCT: 37.4 % — ABNORMAL LOW (ref 39.0–52.0)
Hemoglobin: 12.7 g/dL — ABNORMAL LOW (ref 13.0–17.0)
MCH: 31.5 pg (ref 26.0–34.0)
MCHC: 34 g/dL (ref 30.0–36.0)
MCV: 92.8 fL (ref 80.0–100.0)
Platelets: 133 K/uL — ABNORMAL LOW (ref 150–400)
RBC: 4.03 MIL/uL — ABNORMAL LOW (ref 4.22–5.81)
RDW: 13.1 % (ref 11.5–15.5)
WBC: 5.4 K/uL (ref 4.0–10.5)
nRBC: 0 % (ref 0.0–0.2)

## 2024-07-01 LAB — CULTURE, BLOOD (ROUTINE X 2)
Culture: NO GROWTH
Culture: NO GROWTH

## 2024-07-01 MED ORDER — CEFEPIME HCL 2 G IV SOLR
2.0000 g | Freq: Two times a day (BID) | INTRAVENOUS | Status: AC
  Administered 2024-07-01: 2 g via INTRAVENOUS
  Filled 2024-07-01: qty 12.5

## 2024-07-01 MED ORDER — ACETAMINOPHEN 325 MG PO TABS: 650.0000 mg | ORAL_TABLET | Freq: Four times a day (QID) | ORAL | 0 refills | Status: AC | PRN

## 2024-07-01 NOTE — Progress Notes (Signed)
 Occupational Therapy Treatment Patient Details Name: Samuel Roth MRN: [id number] DOB: [birth year] Today's Date: 07/01/2024   History of present illness Pt is a 73 y/o M admitted on 06/26/24 after presenting with c/o L 4th toe infection. 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 Pt EOB upon arrival, needs min -CGA for ADLs, supervision for transfer with RW. Pt educated on wear and position of shoe along with new WB precautions. Pt verbalizes understanding and adheres to precautions during session. Pt presenting with impairments listed below, will follow acutely. Continue to recommend HHOT at d/c.       If plan is discharge home, recommend the following:  A little help with walking and/or transfers;A little help with bathing/dressing/bathroom;Assistance with cooking/housework;Direct supervision/assist for medications management;Direct supervision/assist for financial management;Assist for transportation;Help with stairs or ramp for entrance   Equipment Recommendations  None recommended by OT    Recommendations for Other Services PT consult    Precautions / Restrictions Precautions Precautions: Fall Precaution/Restrictions Comments: RLE BKA with prosthesis Required Braces or Orthoses: Other Brace Other Brace: post op shoe Restrictions Weight Bearing Restrictions Per Provider Order: Yes LLE Weight Bearing Per Provider Order: Touchdown weight bearing Other Position/Activity Restrictions: Touchdown weightbearing on the left ideally however patient may place full weightbearing through the heel.       Mobility Bed Mobility               General bed mobility comments: EOB upon arrival and in chair at departure    Transfers Overall transfer level: Needs  assistance Equipment used: Rolling walker (2 wheels) Transfers: Sit to/from Stand Sit to Stand: Supervision                 Balance Overall balance assessment: Needs assistance Sitting-balance support: Feet supported Sitting balance-Leahy Scale: Good     Standing balance support: Bilateral upper extremity supported Standing balance-Leahy Scale: Fair                             ADL either performed or assessed with clinical judgement   ADL Overall ADL's : Needs assistance/impaired                     Lower Body Dressing: Minimal assistance;Sitting/lateral leans Lower Body Dressing Details (indicate cue type and reason): for locating straps/ positioning of shoe on foot Toilet Transfer: Contact guard assist;Ambulation;Rolling walker (2 wheels)                  Extremity/Trunk Assessment Upper Extremity Assessment Upper Extremity Assessment: Generalized weakness   Lower Extremity Assessment Lower Extremity Assessment: Defer to PT evaluation        Vision   Additional Comments: sees light but not shadows or figures   Perception Perception Perception: Not tested   Praxis Praxis Praxis: Not tested   Communication Communication Communication: No apparent difficulties   Cognition Arousal: Alert Behavior During Therapy: WFL for tasks assessed/performed Cognition: No apparent impairments             OT - Cognition Comments: tangential in conversation                 Following commands: Intact  Cueing   Cueing Techniques: Verbal cues  Exercises      Shoulder Instructions       General Comments VSS    Pertinent Vitals/ Pain       Pain Assessment Pain Assessment: No/denies pain  Home Living                                          Prior Functioning/Environment              Frequency  Min 2X/week        Progress Toward Goals  OT Goals(current goals can now be found in the  care plan section)  Progress towards OT goals: Progressing toward goals  Acute Rehab OT Goals Patient Stated Goal: to go home OT Goal Formulation: With patient Time For Goal Achievement: 07/13/24 Potential to Achieve Goals: Good ADL Goals Pt Will Perform Upper Body Dressing: Independently;sitting Pt Will Perform Lower Body Dressing: Independently;sitting/lateral leans;sit to/from stand Pt Will Transfer to Toilet: Independently;ambulating;regular height toilet Pt Will Perform Tub/Shower Transfer: Tub transfer;Shower transfer;Independently;ambulating;tub bench;rolling walker  Plan      Co-evaluation                 AM-PAC OT 6 Clicks Daily Activity     Outcome Measure   Help from another person eating meals?: A Little Help from another person taking care of personal grooming?: A Little Help from another person toileting, which includes using toliet, bedpan, or urinal?: A Little Help from another person bathing (including washing, rinsing, drying)?: A Little Help from another person to put on and taking off regular upper body clothing?: A Little Help from another person to put on and taking off regular lower body clothing?: A Little 6 Click Score: 18    End of Session Equipment Utilized During Treatment: Gait belt;Rolling walker (2 wheels)  OT Visit Diagnosis: Unsteadiness on feet (R26.81);Other abnormalities of gait and mobility (R26.89);Muscle weakness (generalized) (M62.81)   Activity Tolerance Patient tolerated treatment well   Patient Left in chair;with call bell/phone within reach;with chair alarm set   Nurse Communication Mobility status        Time: 8996-8970 OT Time Calculation (min): 26 min  Charges: OT General Charges $OT Visit: 1 Visit OT Treatments $Self Care/Home Management : 8-22 mins $Therapeutic Activity: 8-22 mins  Marilee Ditommaso K, OTD, OTR/L SecureChat Preferred Acute Rehab [phone]   Laneta POUR Koonce 07/01/2024, 1:47 PM

## 2024-07-01 NOTE — TOC Transition Note (Signed)
 Transition of Care [facility]) - Discharge Note   Patient Details  Name: Samuel Roth MRN: [id number] Date of Birth: February 03, 1951  [facility]) CM/SW Contact:  Roxie KANDICE Stain, RN Phone Number: 07/01/2024, 2:17 PM   Clinical Narrative:    Patient stable for discharge.  Patient's friend will transport home.  [name] with [facility] is aware of discharge.  PCP verified.  [facility] [place] application sent.  No other ICM (Inpatient Care Management) needs at this time.    Final next level of care: Home w Home Health Services Barriers to Discharge: Barriers Resolved   Patient Goals and CMS Choice Patient states their goals for this hospitalization and ongoing recovery are:: return home with wife CMS Medicare.gov Compare Post Acute Care list provided to:: Patient Choice offered to / list presented to : Patient      Discharge Placement                 Home      Discharge Plan and Services Additional resources added to the After Visit Summary for     Discharge Planning Services: CM Consult, Other - See comment ([facility] [place] appilaction sent) Post Acute Care Choice: Home Health          DME Arranged: [name] DME Agency: [employer] Date DME Agency Contacted: 06/30/24 Time DME Agency Contacted: [phone] Representative spoke with at DME Agency: [name] HH Arranged: RN, PT, OT, Social Work [employer] Agency: [facility] Date HH Agency Contacted: 06/30/24 Time HH Agency Contacted: 1017 Representative spoke with at [facility] Agency: [name]  Social Drivers of Health (SDOH) Interventions SDOH Screenings   Food Insecurity: No Food Insecurity (06/27/2024)  Housing: Low Risk  (06/27/2024)  Transportation Needs: Unmet Transportation Needs (06/27/2024)  Utilities: Not At Risk (06/27/2024)  Social Connections: Moderately Isolated (06/27/2024)  Tobacco Use: Medium Risk (06/30/2024)     Readmission Risk Interventions    07/01/2024    2:17 PM  Readmission Risk Prevention Plan  Post Dischage Appt  Complete  Medication Screening Complete  Transportation Screening Complete

## 2024-07-01 NOTE — Discharge Summary (Signed)
 Physician Discharge Summary   Patient: Samuel Roth MRN: [id number] DOB: 04/10/1951  Admit date:     06/26/2024  Discharge date: 07/01/24  Discharge Physician: Adriana DELENA Grams   PCP: [name], MD   Recommendations at discharge:   Follow-up with PCP in 1 week Follow-up with Dr. Harden in 1 week Continue wound care per instructions Continue current modified diet, strict diabetic diet  Discharge Diagnoses:  73 y.o. male with medical history significant of hypertension, diabetes, legally blind, previous L fifth toe amputation presenting with left fourth toe infection/foot  and found to have OM of LLE 4th digit and possible abscess on MRI. The patient presented with a foot ulcer on the LLE 4th digit, having previously undergone amputation of his LLE 5th digit. The ulcer initially appeared on this past Monday, and the patient sought assistance from his home healthcare services the following day. Initially, the ulcer was not considered severe by the healthcare team, but it worsened with increased redness; thus, the NP that was following his care recommended he have ED evaluation.   In the ED, pt initially hypotensive 99/51. Labs notable for Cr 1.57 (unclear baseline; Cr 1.22 in 2022). 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 EDP started IV abx (vancomycin /zosyn  x1) and requested medicine admission.  Now s/p 4th Ray amputation.       Assessment & Plan: LLE 4th toe cellulitis  Possible osteomyelitis MRI showing osteomyelitis of fourth proximal and middle phalanges.   - Now status post fourth ray amputation left foot today 9/23. - Plan is to continue antibiotics x 24 more hours and stop on 07/01/24  -  PT/OT eval. -   can discharge on  9/24. -Of note patient currently lives in a hotel with his wife.   AKI Presumed pre-renal; Cr 1.22 in 2022 -MIVF: NS at 50cc/h for 24h - Now resolved, DC IV fluids.   HTN - Losartan  initially continued on admission. - Blood pressures have been trending low. - Resume losartan  - wan on hold due to  soft BP on this admission   Bloody bowel movement: -Paged by nursing yesterday regarding blood in his stool 9/21. - Hemoglobins remained stable with no further blood. - There was a question of a sudden drop in hemoglobin based on lab but looks like BMP lab was also spurious so likely lab error for the hemoglobin of 9.3 early a.m. 9/23.  Regardless his repeat hemoglobin today was normal.   DM2 -Resume home regimen - CBGs good   Social issues: - Despite being blind, patient is primary caregiver for his wife. -Recently evicted from their home and they are now living in a hotel. - Needs a note declaring him blind.  Will write in a separate progress note today.   Thrombocytopenia: - Platelets trending downwards. - Now resolved.     Consultants: Dr. Harden Procedures performed: Left lower extremity foot fourth toe amputation Disposition: Home Diet recommendation:  Discharge Diet Orders (From admission, onward)     Start     Ordered   07/01/24 0000  Diet - low sodium heart healthy        07/01/24 0816   07/01/24 0000  Diet Carb Modified        07/01/24 0816  Cardiac and Carb modified diet DISCHARGE MEDICATION: Allergies as of 07/01/2024       Reactions   Ciprofloxacin Hives   Patient states he is not allergic to Cipro, maybe only the IV form.        Medication List     TAKE these medications    acetaminophen  325 MG tablet Commonly known as: TYLENOL  Take 2 tablets (650 mg total) by mouth every 6 (six) hours as needed for moderate pain (pain score 4-6) or fever.   aspirin 81 MG chewable tablet Chew 81 mg by mouth daily.   Basaglar   KwikPen 100 UNIT/ML INJECT 8 UNIT(S) EVERY DAY BY SUBCUTANEOUS ROUTE AT BEDTIME.   GENTEAL TEARS OP Place 1 drop into both eyes daily as needed (For dry eyes).   insulin  aspart 100 UNIT/ML injection Commonly known as: novoLOG  Correction coverage: Moderate (average weight, post-op)  CBG < 70: implement hypoglycemia protocol  CBG 70 - 120: 0 units  CBG 121 - 150: 2 units  CBG 151 - 200: 3 units  CBG 201 - 250: 5 units  CBG 251 - 300: 8 units  CBG 301 - 350: 11 units  CBG 351 - 400: 15 units  CBG > 400 call MD and obtain STAT lab verification What changed:  how much to take how to take this when to take this reasons to take this additional instructions   losartan  50 MG tablet Commonly known as: COZAAR  Take 1 tablet by mouth daily.               Durable Medical Equipment  (From admission, onward)           Start     Ordered   06/30/24 1604  For home use only DME Walker rolling  Once       Question Answer Comment  Walker: With 5 Inch Wheels   Patient needs a walker to treat with the following condition Weakness      06/30/24 1603              Discharge Care Instructions  (From admission, onward)           Start     Ordered   07/01/24 0000  Discharge wound care:       Comments: Follow with the PCP in 1 week, follow-up with Dr. Harden in 1 week , Modify diet, wound care per instructions   07/01/24 0816            Follow-up Information     Harden Jerona GAILS, MD Follow up in 1 week(s).   Specialty: Orthopedic Surgery Contact information: [address] [place]  [place] [address] [phone]         [place], [facility] Follow up.   Specialty: Home Health Services Why: Home health has been arranged. They will contact you to schedule apt Contact information: [address] [place] [address] [phone]                Discharge Exam: Fredricka Weights   06/26/24 1933 06/30/24 0640  Weight: 79.4 kg 79.4  kg        General:  AAO x 3,  cooperative, no distress;   HEENT:  Normocephalic, PERRL, otherwise with in Normal limits   Neuro:  CNII-XII intact. , normal motor and sensation, reflexes intact   Lungs:   Clear to auscultation BL, Respirations unlabored,  No wheezes / crackles  Cardio:    S1/S2, RRR, No murmure,  No Rubs or Gallops   Abdomen:  Soft, non-tender, bowel sounds active all four quadrants, no guarding or peritoneal signs.  Muscular  skeletal:  Limited exam -global generalized weaknesses - in bed, able to move all 4 extremities,   2+ pulses,  symmetric, No pitting edema  Skin:  Dry, warm to touch, negative for any Rashes, Surgical wound left lower extremity foot fourth toe amputated  Wounds: Postop left lower extremity foot fourth toe amputation         Condition at discharge: good  The results of significant diagnostics from this hospitalization (including imaging, microbiology, ancillary and laboratory) are listed below for reference.   Imaging Studies: MR FOOT LEFT W WO CONTRAST Result Date: 06/27/2024 CLINICAL DATA:  Osteomyelitis, foot. History of small toe amputation 03/10/2021. EXAM: MRI OF THE LEFT FOREFOOT WITHOUT AND WITH CONTRAST TECHNIQUE: Multiplanar, multisequence MR imaging of the left forefoot was performed both before and after administration of intravenous contrast. CONTRAST:  7.5mL GADAVIST  GADOBUTROL  1 MMOL/ML IV SOLN COMPARISON:  Radiographs 06/26/2024 and 03/07/2021. FINDINGS: Bones/Joint/Cartilage Postsurgical changes from amputation through the proximal aspect of the 5th metatarsal. The 5th metatarsal remnant is intact without marrow signal abnormality or abnormal enhancement. There are hammertoe deformities with apparent skin ulceration along the dorsal aspect of the 4th proximal interphalangeal joint. Soft tissue findings are further described below. There is extensive abnormal signal throughout the 4th proximal and middle phalanges with decreased T1  signal, increased T2 signal and diffuse marrow enhancement, consistent with osteomyelitis. The 4th metatarsal appears intact. Mild degenerative changes at the 1st metatarsophalangeal joint. The 1st through 3rd rays otherwise appear unremarkable. No significant joint effusions. The alignment is normal at the Lisfranc joint. Ligaments Intact Lisfranc ligament. The collateral ligaments of the remaining metatarsophalangeal joints appear intact. Muscles and Tendons Generalized forefoot muscular atrophy and edema. No focal intramuscular fluid collection identified. Soft tissues As above, apparent dorsal soft tissue ulceration along the 4th proximal interphalangeal joint with an underlying small fluid collection measuring approximately 10 x 9 x 6 mm, best seen on the postcontrast images. This may communicate with the proximal interphalangeal joint. No other focal fluid collections are identified. There is diffuse soft tissue edema and enhancement in the 4th toe. IMPRESSION: 1. Findings are consistent with osteomyelitis of the 4th proximal and middle phalanges. 2. Small fluid collection along the dorsal aspect of the 4th proximal interphalangeal joint suspicious for a small abscess which may communicate with the proximal interphalangeal joint. 3. No evidence of osteomyelitis in the 5th metatarsal remnant. 4. Generalized forefoot muscular atrophy and edema consistent with chronic denervation. Electronically Signed   By: Elsie Perone M.D.   On: 06/27/2024 11:46   DG Foot Complete Left Result Date: 06/26/2024 CLINICAL DATA:  Pressure ulcer. EXAM: LEFT FOOT - COMPLETE 3+ VIEW COMPARISON:  Left foot x-ray 03/07/2021. FINDINGS: There has been interval amputation of the fifth toe and distal aspect of the fifth metatarsal. There is no acute fracture, dislocation or cortical erosion. The bones are osteopenic. There is a plantar calcaneal spur. There is soft tissue swelling of the dorsum of the foot and of the fourth toe.  IMPRESSION: 1. Interval amputation of the fifth toe and distal aspect of the fifth metatarsal. 2. Soft tissue swelling of the dorsum of the foot and of the fourth toe 3. No radiographic findings for acute osteomyelitis. Electronically Signed   By: Greig Pique M.D.   On: 06/26/2024 20:27    Microbiology: Results for orders placed or performed during  the hospital encounter of 06/26/24  Blood Cultures x 2 sites     Status: None   Collection Time: 06/26/24  8:14 PM   Specimen: BLOOD RIGHT ARM  Result Value Ref Range Status   Specimen Description BLOOD RIGHT ARM  Final   Special Requests   Final    BOTTLES DRAWN AEROBIC AND ANAEROBIC Blood Culture results may not be optimal due to an inadequate volume of blood received in culture bottles   Culture   Final    NO GROWTH 5 DAYS Performed at [facility], [address], [place], [place] [address]    Report Status 07/01/2024 FINAL  Final  Blood Cultures x 2 sites     Status: None   Collection Time: 06/26/24  8:14 PM   Specimen: BLOOD RIGHT HAND  Result Value Ref Range Status   Specimen Description BLOOD RIGHT HAND  Final   Special Requests   Final    BOTTLES DRAWN AEROBIC ONLY Blood Culture results may not be optimal due to an inadequate volume of blood received in culture bottles   Culture   Final    NO GROWTH 5 DAYS Performed at [facility], [address] [place], [place] [address]    Report Status 07/01/2024 FINAL  Final    Labs: CBC: Recent Labs  Lab 06/26/24 2014 06/27/24 1018 06/29/24 0451 06/30/24 0206 06/30/24 0922 07/01/24 0217  WBC 8.3 6.1 4.8 4.2 5.5 5.4  NEUTROABS 6.1  --   --   --   --   --   HGB 14.7 14.3 13.4 9.3* 13.5 12.7*  HCT 44.3 42.1 39.4 27.9* 39.9 37.4*  MCV 92.5 91.7 92.1 95.2 92.4 92.8  PLT 150 147* 148* 101* 158 133*   Basic Metabolic Panel: Recent Labs  Lab 06/26/24 1958 06/26/24 2014 06/27/24 1018 06/29/24 0451 06/30/24 0529 07/01/24 0509  NA 137 135  --  139 138 139  K 4.1  4.1  --  4.0 4.3 4.1  CL 102 100  --  108 108 105  CO2  --  21*  --  22 21* 24  GLUCOSE 132* 130*  --  104* 117* 118*  BUN 21 20  --  16 11 15   CREATININE 1.50* 1.57* 1.38* 1.14 1.07 1.02  CALCIUM  --  8.8*  --  8.2* 8.0* 8.3*   Liver Function Tests: Recent Labs  Lab 06/26/24 2014  AST 21  ALT 15  ALKPHOS 61  BILITOT 0.6  PROT 7.3  ALBUMIN 3.9   CBG: Recent Labs  Lab 06/30/24 0647 06/30/24 0752 06/30/24 1150 06/30/24 1611 06/30/24 2131  GLUCAP 120* 108* 165* 106* 126*    Discharge time spent: greater than 40 minutes.  Signed: Adriana DELENA Grams, MD [facility] 07/01/2024

## 2024-07-01 NOTE — Progress Notes (Signed)
 Orthopedic Tech Progress Note Patient Details:  Samuel Roth 1951/05/26 [id number]  Spoke with PT this morning about patient needing a better fitting shoe, so I switched out a large post op shoe for a medium one. Worked with patient on applying as well as removal of shoe.  Patient ID: Samuel Roth, male   DOB: 02-25-1951, 73 y.o.   MRN: [id number]  Samuel Roth Pac 07/01/2024, 9:27 AM

## 2024-07-01 NOTE — Progress Notes (Signed)
 [facility]  Subjective  - No new complaints, pain well controlled.  He is blind.     Objective 110/61 67 98.6 F (37 C) (Oral) 17 98%  Intake/Output Summary (Last 24 hours) at 07/01/2024 0809 Last data filed at 06/30/2024 2308 Gross per 24 hour  Intake --  Output 2075 ml  Net -2075 ml    Left foot dressing clean and dry Leg warm to touch Right BKA prosthesis in room.  Assessment/Planning: POD # 1 s/p left fourth ray amputation  Weightbearing: Touchdown weightbearing on the left ideally however patient may place full weightbearing through the heel.  Dry dressing change and follow-up in the office  Walker ordered for home F/U in 1 week  Maurilio Deland Collet 07/01/2024 8:09 AM --  Laboratory Lab Results: Recent Labs    06/30/24 0922 07/01/24 0217  WBC 5.5 5.4  HGB 13.5 12.7*  HCT 39.9 37.4*  PLT 158 133*   BMET Recent Labs    06/30/24 0529 07/01/24 0509  NA 138 139  K 4.3 4.1  CL 108 105  CO2 21* 24  GLUCOSE 117* 118*  BUN 11 15  CREATININE 1.07 1.02  CALCIUM 8.0* 8.3*    COAG No results found for: INR, PROTIME No results found for: PTT

## 2024-07-01 NOTE — Progress Notes (Signed)
 Patient is not a candidate for discharge lounge. Personal ride will arrive at 5pm. Sherline Jubilee

## 2024-07-01 NOTE — Plan of Care (Signed)

## 2024-07-01 NOTE — Progress Notes (Signed)
 Physical Therapy Treatment Patient Details Name: Samuel Roth MRN: [id number] DOB: [birth year] Today's Date: 07/01/2024   History of Present Illness Pt is a 73 y/o M admitted on 06/26/24 after presenting with c/o L 4th toe infection. 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 Overall, reports good pain control and is in good spirits. Noted pt post op shoe slightly large; contacted ortho tech. Reviewed weightbearing precautions, post op shoe use, elevation and recommendation to limit ambulation during recovery (using w/c for longer distances). Pt ambulating room distances with a RW, utilizing step to pattern with cueing for sequencing/technique. Recommendation of HHPT remains appropriate.    If plan is discharge home, recommend the following: A little help with walking and/or transfers;A little help with bathing/dressing/bathroom;Assistance with cooking/housework;Assist for transportation;Help with stairs or ramp for entrance   Can travel by private vehicle        Equipment Recommendations  Rolling walker (2 wheels)    Recommendations for Other Services       Precautions / Restrictions Precautions Precautions: Fall Precaution/Restrictions Comments: RLE BKA with prosthesis Required Braces or Orthoses: Other Brace Other Brace: post op shoe Restrictions Weight Bearing Restrictions Per Provider Order: Yes LLE Weight Bearing Per Provider Order: Touchdown weight bearing Other Position/Activity Restrictions: Touchdown weightbearing on the left ideally however patient may place full weightbearing through the heel.     Mobility  Bed Mobility Overal bed mobility: Modified Independent                  Transfers Overall transfer level: Needs assistance Equipment used: Rolling walker (2 wheels) Transfers: Sit to/from  Stand Sit to Stand: Supervision           General transfer comment: Verbal cues for placing L foot slightly anteriorly to prevent WB with transition to stand or sit    Ambulation/Gait Ambulation/Gait assistance: Supervision Gait Distance (Feet): 40 Feet Assistive device: Rolling walker (2 wheels) Gait Pattern/deviations: Step-to pattern Gait velocity: decreased Gait velocity interpretation: <1.8 ft/sec, indicate of risk for recurrent falls   General Gait Details: Verbal cues for sequencing/technique i.e. placing L heel down first, pushing through arms, and then proceeding with RLE with step to pattern.   Stairs             Wheelchair Mobility     Tilt Bed    Modified Rankin (Stroke Patients Only)       Balance Overall balance assessment: Needs assistance Sitting-balance support: Feet supported Sitting balance-Leahy Scale: Good     Standing balance support: Bilateral upper extremity supported Standing balance-Leahy Scale: Fair                              Hotel manager: No apparent difficulties  Cognition Arousal: Alert Behavior During Therapy: WFL for tasks assessed/performed   PT - Cognitive impairments: No apparent impairments                         Following commands: Intact      Cueing Cueing Techniques: Verbal cues  Exercises      General Comments        Pertinent Vitals/Pain Pain Assessment Pain Assessment: Faces Faces Pain Scale: No hurt    Home  Living                          Prior Function            PT Goals (current goals can now be found in the care plan section) Acute Rehab PT Goals Patient Stated Goal: L foot get better Potential to Achieve Goals: Good Progress towards PT goals: Progressing toward goals    Frequency    Min 2X/week      PT Plan      Co-evaluation              AM-PAC PT 6 Clicks Mobility   Outcome Measure  Help needed  turning from your back to your side while in a flat bed without using bedrails?: None Help needed moving from lying on your back to sitting on the side of a flat bed without using bedrails?: None Help needed moving to and from a bed to a chair (including a wheelchair)?: A Little Help needed standing up from a chair using your arms (e.g., wheelchair or bedside chair)?: A Little Help needed to walk in hospital room?: A Little Help needed climbing 3-5 steps with a railing? : A Lot 6 Click Score: 19    End of Session Equipment Utilized During Treatment: Other (comment) (post op shoe) Activity Tolerance: Patient tolerated treatment well Patient left: in bed;with call bell/phone within reach   PT Visit Diagnosis: Other abnormalities of gait and mobility (R26.89)     Time: 9184-9159 PT Time Calculation (min) (ACUTE ONLY): 25 min  Charges:    $Gait Training: 8-22 mins $Therapeutic Activity: 8-22 mins PT General Charges $$ ACUTE PT VISIT: 1 Visit                     Aleck Daring, PT, DPT Acute Rehabilitation Services Office [phone]    Aleck ONEIDA Daring 07/01/2024, 8:49 AM

## 2024-07-01 NOTE — Progress Notes (Signed)
 Discharge instructions (including medications) discussed with and copy provided to patient. IV removed belongings packed. DME equipment at bedside. All questions and concerns answered. Samuel Roth

## 2024-07-07 ENCOUNTER — Ambulatory Visit: Admitting: Family

## 2024-07-07 DIAGNOSIS — M869 Osteomyelitis, unspecified: Secondary | ICD-10-CM

## 2024-07-07 NOTE — Progress Notes (Signed)
   Post-Op Visit Note   Patient: Samuel Roth           Date of Birth: 1950-11-29           MRN: [id number] Visit Date: 07/07/2024 PCP: [facility]  Chief Complaint:  Chief Complaint  Patient presents with   Left Foot - Routine Post Op    06/30/2024 left 4th ray amputation    HPI:  HPI The patient is a 73 year old gentleman seen status post fourth ray amputation 1 week ago Ortho Exam Incision well-approximated sutures there is no gaping or drainage  Visit Diagnoses: No diagnosis found.  Plan: Begin daily Dial soap cleansing.  Dry dressings.  Follow-up in 2 weeks for suture removal  Follow-Up Instructions: No follow-ups on file.   Imaging: No results found.  Orders:  No orders of the defined types were placed in this encounter.  No orders of the defined types were placed in this encounter.    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  Past Surgical History:  Procedure Laterality Date   AMPUTATION Right 03/01/2017   Procedure: RIGHT TRANS METATARSAL AMPUTATION;  Surgeon: Harden Jerona GAILS, MD;  Location: [facility] OR;  Service: Orthopedics;  Laterality: Right;   AMPUTATION Right 03/03/2017   Procedure: RIGHT BKA;  Surgeon: Harden Jerona GAILS, MD;  Location: [facility] OR;  Service: Orthopedics;   Laterality: Right;   AMPUTATION TOE Left 03/10/2021   Procedure: AMPUTATION LEFT LITTLE TOE;  Surgeon: Harden Jerona GAILS, MD;  Location: [facility] OR;  Service: Orthopedics;  Laterality: Left;   AMPUTATION TOE Left 06/30/2024   Procedure: LEFT FOOT 4TH TOE AMPUTATION;  Surgeon: Harden Jerona GAILS, MD;  Location: [facility] OR;  Service: Orthopedics;  Laterality: Left;  LEFT FOOT 4TH TOE AMPUTATION   EYE SURGERY     TIBIA FRACTURE SURGERY     Social History   Occupational History   Not on file  Tobacco Use   Smoking status: Former   Smokeless tobacco: Former  Substance and Sexual Activity   Alcohol use: Yes    Comment: social   Drug use: No   Sexual activity: Not on file

## 2024-07-10 ENCOUNTER — Telehealth: Payer: Self-pay

## 2024-07-10 NOTE — Telephone Encounter (Signed)
 Nurse called wanting wound care orders for patient. Left Toe surgery on 06/30/2024. CB# [phone].  Please advise.  Thank you.

## 2024-07-10 NOTE — Telephone Encounter (Signed)
 Called number provided below. No answer and no identified VM message. Asked the caller to call back to discuss.

## 2024-07-20 NOTE — Telephone Encounter (Signed)
 No call back in regards to message below.

## 2024-07-21 ENCOUNTER — Ambulatory Visit (INDEPENDENT_AMBULATORY_CARE_PROVIDER_SITE_OTHER): Admitting: Family

## 2024-07-21 ENCOUNTER — Encounter: Payer: Self-pay | Admitting: Family

## 2024-07-21 DIAGNOSIS — M869 Osteomyelitis, unspecified: Secondary | ICD-10-CM

## 2024-07-21 DIAGNOSIS — Z89511 Acquired absence of right leg below knee: Secondary | ICD-10-CM

## 2024-07-21 NOTE — Progress Notes (Signed)
   Post-Op Visit Note   Patient: Samuel Roth           Date of Birth: [birth year]           MRN: [id number] Visit Date: 07/21/2024 PCP: [facility]  Chief Complaint:  Chief Complaint  Patient presents with   Left Foot - Routine Post Op    06/30/2024 left 4th ray amputation    HPI:  HPI The patient is a 73 year old gentleman seen status post left foot fourth ray amputation Ortho Exam On examination left foot sutures are in place this is well-healed these are harvested today without incident  Visit Diagnoses: No diagnosis found.  Plan: May advance to regular shoewear given an order for shrinker for his right below-knee amputation  Follow-Up Instructions: No follow-ups on file.   Imaging: No results found.  Orders:  No orders of the defined types were placed in this encounter.  No orders of the defined types were placed in this encounter.    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  Past Surgical History:  Procedure Laterality Date   AMPUTATION Right 03/01/2017   Procedure: RIGHT TRANS METATARSAL AMPUTATION;  Surgeon: Harden Jerona GAILS, MD;  Location: [facility] OR;  Service: Orthopedics;  Laterality: Right;   AMPUTATION Right 03/03/2017   Procedure: RIGHT BKA;  Surgeon: Harden Jerona GAILS, MD;  Location: [facility] OR;  Service: Orthopedics;  Laterality: Right;   AMPUTATION TOE Left 03/10/2021   Procedure: AMPUTATION LEFT LITTLE TOE;  Surgeon: Harden Jerona GAILS, MD;  Location: [facility] OR;  Service: Orthopedics;  Laterality: Left;   AMPUTATION TOE Left 06/30/2024   Procedure: LEFT FOOT 4TH TOE AMPUTATION;  Surgeon: Harden Jerona GAILS, MD;  Location: [facility] OR;  Service: Orthopedics;  Laterality: Left;  LEFT FOOT 4TH TOE AMPUTATION   EYE SURGERY     TIBIA FRACTURE SURGERY     Social History   Occupational History   Not on file  Tobacco Use   Smoking status: Former   Smokeless tobacco: Former  Substance and Sexual Activity   Alcohol use: Yes    Comment: social   Drug use: No   Sexual activity: Not on file

## 2024-07-24 ENCOUNTER — Telehealth: Payer: Self-pay | Admitting: Orthopedic Surgery

## 2024-07-24 NOTE — Telephone Encounter (Signed)
 Neka informed okay to d/c if they feel comfortable at that time since he is doing well.

## 2024-07-24 NOTE — Telephone Encounter (Signed)
 Neka from [employer] called and wants to know if its ok to discharge him in two weeks since things are looking good? CB#[phone]
